# Patient Record
Sex: Female | Born: 1935 | Race: Black or African American | Hispanic: No | Smoking: Former smoker
Health system: Southern US, Community
[De-identification: ages and names within clinical notes are randomized; demographics above are authoritative.]

## PROBLEM LIST (undated history)

## (undated) DIAGNOSIS — M545 Low back pain: Secondary | ICD-10-CM

## (undated) DIAGNOSIS — I639 Cerebral infarction, unspecified: Secondary | ICD-10-CM

## (undated) DIAGNOSIS — I10 Essential (primary) hypertension: Secondary | ICD-10-CM

## (undated) DIAGNOSIS — M549 Dorsalgia, unspecified: Secondary | ICD-10-CM

## (undated) DIAGNOSIS — I69393 Ataxia following cerebral infarction: Secondary | ICD-10-CM

## (undated) DIAGNOSIS — G8929 Other chronic pain: Secondary | ICD-10-CM

## (undated) DIAGNOSIS — F419 Anxiety disorder, unspecified: Secondary | ICD-10-CM

## (undated) DIAGNOSIS — M25551 Pain in right hip: Secondary | ICD-10-CM

---

## 1997-07-29 HISTORY — PX: CATARACT EXTRACTION, BILATERAL: SHX1313

## 2005-10-08 ENCOUNTER — Ambulatory Visit (HOSPITAL_COMMUNITY): Admission: RE | Admit: 2005-10-08 | Discharge: 2005-10-09 | Payer: Self-pay | Admitting: Ophthalmology

## 2009-07-29 HISTORY — PX: BACK SURGERY: SHX140

## 2011-04-12 ENCOUNTER — Encounter (INDEPENDENT_AMBULATORY_CARE_PROVIDER_SITE_OTHER): Payer: Medicare Other | Admitting: Ophthalmology

## 2011-04-12 DIAGNOSIS — H35349 Macular cyst, hole, or pseudohole, unspecified eye: Secondary | ICD-10-CM

## 2011-04-12 DIAGNOSIS — H43819 Vitreous degeneration, unspecified eye: Secondary | ICD-10-CM

## 2013-01-26 DEATH — deceased

## 2017-03-03 ENCOUNTER — Inpatient Hospital Stay (HOSPITAL_COMMUNITY): Payer: Medicare Other

## 2017-03-03 ENCOUNTER — Inpatient Hospital Stay (HOSPITAL_COMMUNITY)
Admission: AD | Admit: 2017-03-03 | Discharge: 2017-03-10 | DRG: 064 | Disposition: A | Discharge status: Rehabilitation Facility | Payer: Medicare Other | Source: Other Acute Inpatient Hospital | Attending: Neurology | Admitting: Neurology

## 2017-03-03 ENCOUNTER — Encounter (HOSPITAL_COMMUNITY): Payer: Self-pay | Admitting: *Deleted

## 2017-03-03 DIAGNOSIS — Z87891 Personal history of nicotine dependence: Secondary | ICD-10-CM | POA: Diagnosis not present

## 2017-03-03 DIAGNOSIS — I10 Essential (primary) hypertension: Secondary | ICD-10-CM | POA: Diagnosis present

## 2017-03-03 DIAGNOSIS — F419 Anxiety disorder, unspecified: Secondary | ICD-10-CM | POA: Diagnosis present

## 2017-03-03 DIAGNOSIS — G4700 Insomnia, unspecified: Secondary | ICD-10-CM | POA: Diagnosis present

## 2017-03-03 DIAGNOSIS — Z823 Family history of stroke: Secondary | ICD-10-CM | POA: Diagnosis not present

## 2017-03-03 DIAGNOSIS — Z9841 Cataract extraction status, right eye: Secondary | ICD-10-CM | POA: Diagnosis not present

## 2017-03-03 DIAGNOSIS — K635 Polyp of colon: Secondary | ICD-10-CM | POA: Diagnosis not present

## 2017-03-03 DIAGNOSIS — R269 Unspecified abnormalities of gait and mobility: Secondary | ICD-10-CM

## 2017-03-03 DIAGNOSIS — I639 Cerebral infarction, unspecified: Secondary | ICD-10-CM | POA: Diagnosis present

## 2017-03-03 DIAGNOSIS — Z79899 Other long term (current) drug therapy: Secondary | ICD-10-CM | POA: Diagnosis not present

## 2017-03-03 DIAGNOSIS — M4802 Spinal stenosis, cervical region: Secondary | ICD-10-CM | POA: Diagnosis present

## 2017-03-03 DIAGNOSIS — Z882 Allergy status to sulfonamides status: Secondary | ICD-10-CM

## 2017-03-03 DIAGNOSIS — D649 Anemia, unspecified: Secondary | ICD-10-CM | POA: Diagnosis present

## 2017-03-03 DIAGNOSIS — K640 First degree hemorrhoids: Secondary | ICD-10-CM | POA: Diagnosis present

## 2017-03-03 DIAGNOSIS — Z7982 Long term (current) use of aspirin: Secondary | ICD-10-CM | POA: Diagnosis not present

## 2017-03-03 DIAGNOSIS — I63532 Cerebral infarction due to unspecified occlusion or stenosis of left posterior cerebral artery: Secondary | ICD-10-CM | POA: Diagnosis not present

## 2017-03-03 DIAGNOSIS — I69393 Ataxia following cerebral infarction: Secondary | ICD-10-CM | POA: Diagnosis not present

## 2017-03-03 DIAGNOSIS — E876 Hypokalemia: Secondary | ICD-10-CM | POA: Diagnosis present

## 2017-03-03 DIAGNOSIS — R42 Dizziness and giddiness: Secondary | ICD-10-CM | POA: Diagnosis not present

## 2017-03-03 DIAGNOSIS — Z881 Allergy status to other antibiotic agents status: Secondary | ICD-10-CM | POA: Diagnosis not present

## 2017-03-03 DIAGNOSIS — K5900 Constipation, unspecified: Secondary | ICD-10-CM | POA: Diagnosis present

## 2017-03-03 DIAGNOSIS — I610 Nontraumatic intracerebral hemorrhage in hemisphere, subcortical: Secondary | ICD-10-CM

## 2017-03-03 DIAGNOSIS — Z888 Allergy status to other drugs, medicaments and biological substances status: Secondary | ICD-10-CM | POA: Diagnosis not present

## 2017-03-03 DIAGNOSIS — I619 Nontraumatic intracerebral hemorrhage, unspecified: Secondary | ICD-10-CM | POA: Diagnosis present

## 2017-03-03 DIAGNOSIS — G8929 Other chronic pain: Secondary | ICD-10-CM | POA: Diagnosis present

## 2017-03-03 DIAGNOSIS — R195 Other fecal abnormalities: Secondary | ICD-10-CM

## 2017-03-03 DIAGNOSIS — K633 Ulcer of intestine: Secondary | ICD-10-CM | POA: Diagnosis present

## 2017-03-03 DIAGNOSIS — Z8673 Personal history of transient ischemic attack (TIA), and cerebral infarction without residual deficits: Secondary | ICD-10-CM

## 2017-03-03 DIAGNOSIS — G936 Cerebral edema: Secondary | ICD-10-CM | POA: Diagnosis present

## 2017-03-03 DIAGNOSIS — D123 Benign neoplasm of transverse colon: Secondary | ICD-10-CM | POA: Diagnosis present

## 2017-03-03 DIAGNOSIS — K2980 Duodenitis without bleeding: Secondary | ICD-10-CM | POA: Diagnosis present

## 2017-03-03 DIAGNOSIS — K920 Hematemesis: Secondary | ICD-10-CM | POA: Diagnosis not present

## 2017-03-03 DIAGNOSIS — Z79891 Long term (current) use of opiate analgesic: Secondary | ICD-10-CM | POA: Diagnosis not present

## 2017-03-03 DIAGNOSIS — R270 Ataxia, unspecified: Secondary | ICD-10-CM | POA: Diagnosis present

## 2017-03-03 DIAGNOSIS — F411 Generalized anxiety disorder: Secondary | ICD-10-CM | POA: Diagnosis not present

## 2017-03-03 DIAGNOSIS — K5730 Diverticulosis of large intestine without perforation or abscess without bleeding: Secondary | ICD-10-CM | POA: Diagnosis present

## 2017-03-03 DIAGNOSIS — M545 Low back pain, unspecified: Secondary | ICD-10-CM

## 2017-03-03 DIAGNOSIS — E042 Nontoxic multinodular goiter: Secondary | ICD-10-CM | POA: Diagnosis present

## 2017-03-03 DIAGNOSIS — E785 Hyperlipidemia, unspecified: Secondary | ICD-10-CM | POA: Diagnosis present

## 2017-03-03 DIAGNOSIS — Z91018 Allergy to other foods: Secondary | ICD-10-CM

## 2017-03-03 DIAGNOSIS — K921 Melena: Secondary | ICD-10-CM | POA: Diagnosis present

## 2017-03-03 DIAGNOSIS — Z9842 Cataract extraction status, left eye: Secondary | ICD-10-CM

## 2017-03-03 DIAGNOSIS — I63233 Cerebral infarction due to unspecified occlusion or stenosis of bilateral carotid arteries: Secondary | ICD-10-CM | POA: Diagnosis not present

## 2017-03-03 DIAGNOSIS — R2689 Other abnormalities of gait and mobility: Secondary | ICD-10-CM | POA: Diagnosis present

## 2017-03-03 DIAGNOSIS — I69993 Ataxia following unspecified cerebrovascular disease: Secondary | ICD-10-CM | POA: Diagnosis not present

## 2017-03-03 DIAGNOSIS — I614 Nontraumatic intracerebral hemorrhage in cerebellum: Secondary | ICD-10-CM | POA: Diagnosis not present

## 2017-03-03 HISTORY — DX: Pain in right hip: M25.551

## 2017-03-03 HISTORY — DX: Dorsalgia, unspecified: M54.9

## 2017-03-03 HISTORY — DX: Cerebral infarction, unspecified: I63.9

## 2017-03-03 HISTORY — DX: Anxiety disorder, unspecified: F41.9

## 2017-03-03 HISTORY — DX: Low back pain: M54.5

## 2017-03-03 HISTORY — DX: Ataxia following cerebral infarction: I69.393

## 2017-03-03 HISTORY — DX: Essential (primary) hypertension: I10

## 2017-03-03 HISTORY — DX: Other chronic pain: G89.29

## 2017-03-03 LAB — MRSA PCR SCREENING: MRSA BY PCR: NEGATIVE

## 2017-03-03 MED ORDER — NICARDIPINE HCL IN NACL 20-0.86 MG/200ML-% IV SOLN: 0.0000 mg/h | INTRAVENOUS | Status: DC

## 2017-03-03 MED ORDER — NICARDIPINE HCL IN NACL 20-0.86 MG/200ML-% IV SOLN
3.0000 mg/h | INTRAVENOUS | Status: DC
  Administered 2017-03-03: 3 mg/h via INTRAVENOUS

## 2017-03-03 MED ORDER — STROKE: EARLY STAGES OF RECOVERY BOOK
Freq: Once | Status: DC
  Filled 2017-03-03: qty 1

## 2017-03-03 MED ORDER — LABETALOL HCL 5 MG/ML IV SOLN: 20.0000 mg | Freq: Once | INTRAVENOUS | Status: DC

## 2017-03-03 MED ORDER — SODIUM CHLORIDE 0.9 % IV SOLN
INTRAVENOUS | Status: DC
  Administered 2017-03-03 – 2017-03-05 (×6): via INTRAVENOUS

## 2017-03-03 MED ORDER — ACETAMINOPHEN 325 MG PO TABS: 650.0000 mg | ORAL_TABLET | ORAL | Status: DC | PRN

## 2017-03-03 MED ORDER — IOPAMIDOL (ISOVUE-370) INJECTION 76%
INTRAVENOUS | Status: AC
  Administered 2017-03-03: 50 mL
  Filled 2017-03-03: qty 50

## 2017-03-03 MED ORDER — SODIUM CHLORIDE 0.9 % IV SOLN: INTRAVENOUS | Status: DC

## 2017-03-03 MED ORDER — ACETAMINOPHEN 160 MG/5ML PO SOLN: 650.0000 mg | ORAL | Status: DC | PRN

## 2017-03-03 MED ORDER — ACETAMINOPHEN 325 MG PO TABS
650.0000 mg | ORAL_TABLET | Freq: Four times a day (QID) | ORAL | Status: DC | PRN
  Administered 2017-03-03 – 2017-03-08 (×5): 650 mg via ORAL
  Filled 2017-03-03 (×6): qty 2

## 2017-03-03 MED ORDER — LABETALOL HCL 5 MG/ML IV SOLN: 10.0000 mg | INTRAVENOUS | Status: DC | PRN

## 2017-03-03 MED ORDER — PANTOPRAZOLE SODIUM 40 MG IV SOLR: 40.0000 mg | Freq: Every day | INTRAVENOUS | Status: DC

## 2017-03-03 MED ORDER — HYDRALAZINE HCL 20 MG/ML IJ SOLN
10.0000 mg | INTRAMUSCULAR | Status: DC | PRN
  Administered 2017-03-03: 20 mg via INTRAVENOUS
  Filled 2017-03-03: qty 1

## 2017-03-03 MED ORDER — SENNOSIDES-DOCUSATE SODIUM 8.6-50 MG PO TABS: 1.0000 | ORAL_TABLET | Freq: Two times a day (BID) | ORAL | Status: DC

## 2017-03-03 MED ORDER — NICARDIPINE HCL IN NACL 20-0.86 MG/200ML-% IV SOLN
5.0000 mg/h | INTRAVENOUS | Status: DC
  Filled 2017-03-03: qty 200

## 2017-03-03 MED ORDER — STROKE: EARLY STAGES OF RECOVERY BOOK
Freq: Once | Status: AC
  Administered 2017-03-03: 20:00:00
  Filled 2017-03-03: qty 1

## 2017-03-03 MED ORDER — CLEVIDIPINE BUTYRATE 0.5 MG/ML IV EMUL
0.0000 mg/h | INTRAVENOUS | Status: DC
  Administered 2017-03-04 (×3): 21 mg/h via INTRAVENOUS
  Administered 2017-03-04: 18 mg/h via INTRAVENOUS
  Administered 2017-03-04: 16 mg/h via INTRAVENOUS
  Administered 2017-03-04: 21 mg/h via INTRAVENOUS
  Administered 2017-03-04: 10 mg/h via INTRAVENOUS
  Administered 2017-03-05: 9 mg/h via INTRAVENOUS
  Administered 2017-03-05: 3 mg/h via INTRAVENOUS
  Administered 2017-03-05: 2 mg/h via INTRAVENOUS
  Filled 2017-03-03 (×11): qty 50

## 2017-03-03 MED ORDER — SENNOSIDES-DOCUSATE SODIUM 8.6-50 MG PO TABS
1.0000 | ORAL_TABLET | Freq: Every evening | ORAL | Status: DC | PRN
  Administered 2017-03-05: 1 via ORAL
  Filled 2017-03-03: qty 1

## 2017-03-03 MED ORDER — ACETAMINOPHEN 650 MG RE SUPP: 650.0000 mg | RECTAL | Status: DC | PRN

## 2017-03-03 MED ORDER — CLEVIDIPINE BUTYRATE 0.5 MG/ML IV EMUL
0.0000 mg/h | INTRAVENOUS | Status: DC
  Administered 2017-03-03 (×3): 21 mg/h via INTRAVENOUS
  Administered 2017-03-03: 2 mg/h via INTRAVENOUS
  Administered 2017-03-03: 4 mg/h via INTRAVENOUS
  Filled 2017-03-03 (×5): qty 50

## 2017-03-03 MED ORDER — LABETALOL HCL 5 MG/ML IV SOLN
10.0000 mg | INTRAVENOUS | Status: AC | PRN
  Administered 2017-03-04 (×2): 10 mg via INTRAVENOUS
  Filled 2017-03-03 (×2): qty 4

## 2017-03-03 NOTE — H&P (Addendum)
History and physical      History obtained from:  Chart and family members  HPI:                                                                                                                                         Ashley Chan is an 83 y.o. female with history of hypertension who presented to [facility] emergency room complaining of not feeling well. Apparently patient woke up in the morning not feeling well and then started noticing dizziness followed by nausea vomiting and some coffee-ground emesis. Initial head CT was negative. Unfortunately they were unable to get MRI secondary to no MRIs being done at [facility] over the weekend. 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 For this reason patient was transferred to [facility] for further evaluation and care.  Date last known well: Date: 03/02/2017 Time last known well: Unable to determine tPA Given: No: Hemorrhagic transformation, no last known normal   Modified Rankin: Rankin Score=0    Past Medical History:  Diagnosis Date  . Hypertension     Past Surgical History:  Procedure Laterality Date  . BACK SURGERY  2011  . CATARACT EXTRACTION, BILATERAL  1999    No family history on file. Social History:  reports that she quit smoking about 55 years ago. Her smoking use included Cigarettes. She started smoking about 69 years ago. She has never used smokeless tobacco. She reports that she does not drink alcohol or use drugs.  Allergies:  Allergies  Allergen Reactions  . Benzyl Alcohol   . Ethanol   . Lidocaine   . Propranolol   . Sulfa Antibiotics     Medications:                                                                                                                           Prior to Admission:  Labetalol 300 mg tabs twice a day Hydrochlorothiazide 12.5  mg by mouth daily Clonazepam 0.5 mg daily when necessary Tramadol 50 mg by mouth twice a day when necessary Diclofenac 75 mg twice a day    Scheduled: .  stroke: mapping our early stages of recovery book   Does not apply Once    ROS:  History obtained from the patient  General ROS: negative for - chills, fatigue, fever, night sweats, weight gain or weight loss Psychological ROS: negative for - behavioral disorder, hallucinations, memory difficulties, mood swings or suicidal ideation Ophthalmic ROS: negative for - blurry vision, double vision, eye pain or loss of vision ENT ROS: negative for - epistaxis, nasal discharge, oral lesions, sore throat, tinnitus or vertigo Allergy and Immunology ROS: negative for - hives or itchy/watery eyes Hematological and Lymphatic ROS: negative for - bleeding problems, bruising or swollen lymph nodes Endocrine ROS: negative for - galactorrhea, hair pattern changes, polydipsia/polyuria or temperature intolerance Respiratory ROS: negative for - cough, hemoptysis, shortness of breath or wheezing Cardiovascular ROS: negative for - chest pain, dyspnea on exertion, edema or irregular heartbeat Gastrointestinal ROS: negative for - abdominal pain, diarrhea, hematemesis, nausea/vomiting or stool incontinence Genito-Urinary ROS: negative for - dysuria, hematuria, incontinence or urinary frequency/urgency Musculoskeletal ROS: negative for - joint swelling or muscular weakness Neurological ROS: as noted in HPI Dermatological ROS: negative for rash and skin lesion changes  Neurologic Examination:                                                                                                      Blood pressure (!) 174/89, pulse (!) 105, resp. rate (!) 22, height 5\' 7"  (1.702 m), SpO2 100 %.  HEENT-  Normocephalic, no lesions, without  obvious abnormality.  Normal external eye and conjunctiva.  Normal TM's bilaterally.  Normal auditory canals and external ears. Normal external nose, mucus membranes and septum.  Normal pharynx. Cardiovascular- S1, S2 normal, pulses palpable throughout   Lungs- chest clear, no wheezing, rales, normal symmetric air entry, Heart exam - S1, S2 normal, no murmur, no gallop, rate regular Abdomen- normal findings: bowel sounds normal Extremities- no edema Lymph-no adenopathy palpable Musculoskeletal-no joint tenderness, deformity or swelling Skin-warm and dry, no hyperpigmentation, vitiligo, or suspicious lesions  Neurological Examination Mental Status: Alert, oriented, thought content appropriate.  Speech fluent without evidence of aphasia.  Able to follow 3 step commands without difficulty. Cranial Nerves: II:  Visual fields grossly normal,  III,IV, VI: ptosis not present, extra-ocular motions intact bilaterally but sacaddic, pupils equal, round, reactive to light and accommodation V,VII: smile symmetric, facial light touch sensation normal bilaterally VIII: hearing normal bilaterally IX,X: uvula rises symmetrically XI: bilateral shoulder shrug XII: midline tongue extension Motor: Right : Upper extremity   5/5    Left:     Upper extremity   5/5  Lower extremity   5/5     Lower extremity   5/5 Tone and bulk:normal tone throughout; no atrophy noted Sensory: Pinprick and light touch intact throughout, bilaterally Deep Tendon Reflexes: 2+ and symmetric throughout Plantars: Right: downgoing   Left: downgoing Cerebellar: Dysmetric left finger-to-nose, dysmetric left heel-to-shin test Gait: not tested for saftey       Lab Results: Basic Metabolic Panel: No results for input(s): NA, K, CL, CO2, GLUCOSE, BUN, CREATININE, CALCIUM, MG, PHOS in the last 168 hours.  Liver Function Tests: No results for input(s): AST, ALT, ALKPHOS, BILITOT, PROT, ALBUMIN in  the last 168 hours. No results for  input(s): LIPASE, AMYLASE in the last 168 hours. No results for input(s): AMMONIA in the last 168 hours.  CBC: No results for input(s): WBC, NEUTROABS, HGB, HCT, MCV, PLT in the last 168 hours.  Cardiac Enzymes: No results for input(s): CKTOTAL, CKMB, CKMBINDEX, TROPONINI in the last 168 hours.  Lipid Panel: No results for input(s): CHOL, TRIG, HDL, CHOLHDL, VLDL, LDLCALC in the last 168 hours.  CBG: No results for input(s): GLUCAP in the last 168 hours.  Microbiology: No results found for this or any previous visit.  Coagulation Studies: No results for input(s): LABPROT, INR in the last 72 hours.  Imaging: No results found.     Assessment and plan discussed with with attending physician and they are in agreement.    Etta Quill PA-C [facility] [phone]  03/03/2017, 3:19 PM   Assessment: 83 y.o. 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 The location could be embolic or thrombotic. She is being admitted to the intensive care unit due to effacement of the fourth ventricle for hydrocephalus watch.  Stroke Risk Factors - hypertension  Plan: 1. HgbA1c, fasting lipid panel 2. Repeat CT scan in the morning 3. Frequent neuro checks 4. Echocardiogram 5. CTA head and neck 6. Prophylactic held due to hemorrhagic transformation 7. Risk factor modification 8. Telemetry monitoring 9. PT consult, OT consult, Speech consult 10. Hypertension control due to hemorrhagic transformation, goal BP less than 833 systolic. Ordered Cardene 11. please page stroke NP  Or  PA  Or MD  from 8am -4 pm as this patient will be followed by the stroke team at this point.   You can look them up on [email or web address]    This patient is critically ill and at significant risk of neurological worsening, death and care requires constant monitoring of vital signs, hemodynamics,respiratory and cardiac monitoring, neurological assessment, discussion with family,  other specialists and medical decision making of high complexity. I spent 45 minutes of neurocritical care time  in the care of  this patient.  Roland Rack, MD [facility] [phone]  If 7pm- 7am, please page neurology on call as listed in AMION. 03/03/2017  4:59 PM

## 2017-03-03 NOTE — Progress Notes (Signed)
Dr. Leonel Ramsay modified BP parameters to less than 140. Cardene gtt started.

## 2017-03-04 ENCOUNTER — Inpatient Hospital Stay (HOSPITAL_COMMUNITY): Payer: Medicare Other

## 2017-03-04 ENCOUNTER — Encounter (HOSPITAL_COMMUNITY): Payer: Self-pay | Admitting: Neurology

## 2017-03-04 DIAGNOSIS — I639 Cerebral infarction, unspecified: Secondary | ICD-10-CM

## 2017-03-04 LAB — VAS US CAROTID
LCCADDIAS: -15 cm/s
LCCAPSYS: 170 cm/s
LEFT ECA DIAS: -25 cm/s
LEFT VERTEBRAL DIAS: 8 cm/s
LICADSYS: -131 cm/s
LICAPSYS: 292 cm/s
Left CCA dist sys: -114 cm/s
Left CCA prox dias: 17 cm/s
Left ICA dist dias: -23 cm/s
Left ICA prox dias: 23 cm/s
RIGHT ECA DIAS: 29 cm/s
RIGHT VERTEBRAL DIAS: -14 cm/s
Right CCA prox dias: -15 cm/s
Right CCA prox sys: -125 cm/s
Right cca dist sys: -98 cm/s

## 2017-03-04 LAB — CBC
HEMATOCRIT: 37 % (ref 36.0–46.0)
Hemoglobin: 13.1 g/dL (ref 12.0–15.0)
MCH: 33.6 pg (ref 26.0–34.0)
MCHC: 35.4 g/dL (ref 30.0–36.0)
MCV: 94.9 fL (ref 78.0–100.0)
Platelets: 259 10*3/uL (ref 150–400)
RBC: 3.9 MIL/uL (ref 3.87–5.11)
RDW: 14.3 % (ref 11.5–15.5)
WBC: 9 10*3/uL (ref 4.0–10.5)

## 2017-03-04 LAB — COMPREHENSIVE METABOLIC PANEL
ALBUMIN: 3.4 g/dL — AB (ref 3.5–5.0)
ALT: 63 U/L — ABNORMAL HIGH (ref 14–54)
AST: 99 U/L — AB (ref 15–41)
Alkaline Phosphatase: 45 U/L (ref 38–126)
Anion gap: 13 (ref 5–15)
BUN: 14 mg/dL (ref 6–20)
CHLORIDE: 104 mmol/L (ref 101–111)
CO2: 19 mmol/L — ABNORMAL LOW (ref 22–32)
Calcium: 8.3 mg/dL — ABNORMAL LOW (ref 8.9–10.3)
Creatinine, Ser: 0.85 mg/dL (ref 0.44–1.00)
GFR calc Af Amer: >60 mL/min (ref >60)
GFR calc non Af Amer: >60 mL/min (ref >60)
Glucose, Bld: 117 mg/dL — ABNORMAL HIGH (ref 65–99)
POTASSIUM: 4.5 mmol/L (ref 3.5–5.1)
Sodium: 136 mmol/L (ref 135–145)
Total Bilirubin: 1.9 mg/dL — ABNORMAL HIGH (ref 0.3–1.2)

## 2017-03-04 LAB — LIPID PANEL
CHOLESTEROL: 216 mg/dL — AB (ref 0–200)
HDL: 26 mg/dL — ABNORMAL LOW (ref >40)
LDL Cholesterol: UNDETERMINED mg/dL (ref 0–99)
TRIGLYCERIDES: 969 mg/dL — AB (ref <150)
Total CHOL/HDL Ratio: 8.3 RATIO
VLDL: UNDETERMINED mg/dL (ref 0–40)

## 2017-03-04 MED ORDER — SODIUM CHLORIDE 0.9 % IV BOLUS (SEPSIS)
500.0000 mL | Freq: Once | INTRAVENOUS | Status: AC
  Administered 2017-03-04: 500 mL via INTRAVENOUS

## 2017-03-04 MED ORDER — ONDANSETRON HCL 4 MG/2ML IJ SOLN
4.0000 mg | INTRAMUSCULAR | Status: DC | PRN
  Administered 2017-03-05 – 2017-03-06 (×3): 4 mg via INTRAVENOUS
  Filled 2017-03-04 (×4): qty 2

## 2017-03-04 MED ORDER — ONDANSETRON HCL 4 MG/2ML IJ SOLN
INTRAMUSCULAR | Status: AC
  Filled 2017-03-04: qty 2

## 2017-03-04 NOTE — Progress Notes (Signed)
Patient's urine output through the night was minimal.MD paged to notify. Awaiting a call back

## 2017-03-04 NOTE — Progress Notes (Signed)
STROKE TEAM PROGRESS NOTE   HISTORY OF PRESENT ILLNESS (per record) Ashley Chan is an 83 y.o. female with history of hypertension who presented to [facility] emergency room complaining of not feeling well. Apparently patient woke up in the morning not feeling well and then started noticing dizziness followed by nausea vomiting and some coffee-ground emesis. Initial head CT was negative. Unfortunately they were unable to get MRI secondary to no MRIs being done at [facility] over the weekend. 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 For this reason patient was transferred to [facility] for further evaluation and care.  Date last known well: Date: 03/02/2017 Time last known well: Unable to determine   Modified Rankin: Rankin Score=0  Patient was not administered IV t-PA secondary to hemorrhagic transformation/no time last known well. She was admitted to the neuro ICU for further evaluation and treatment.   SUBJECTIVE (INTERVAL HISTORY) Her grandaughter is at the bedside.  The patient is awake, alert, and follows commands appropriately.  No need for hypertonic saline at this time.  The patient reports waking up with dizziness, nausea, and vomiting.  Initial imaging at [facility] in the [facility] On day of admission noncontrast CT scan shows no abnormality and follow-up MRI scan done 2 days later shows ischemic infarct with hemorrhagic transformation.  Repeat imaging at [facility] with hemorrhagic transformation of infarct.  Pt is endorsing neck pain now.  LUE ataxia and saccadic dysmetria on exam.  OBJECTIVE Temp:  [98.5 F (36.9 C)-99.3 F (37.4 C)] 98.7 F (37.1 C) (08/07 1200) Pulse Rate:  [77-118] 91 (08/07 1315) Cardiac Rhythm: Normal sinus rhythm (08/07 0800) Resp:  [12-28] 18 (08/07 1315) BP: (113-187)/(45-139) 155/54 (08/07  1315) SpO2:  [95 %-100 %] 97 % (08/07 0800)  CBC:   Recent Labs Lab 03/04/17 0329  WBC 9.0  HGB 13.1  HCT 37.0  MCV 94.9  PLT 811    Basic Metabolic Panel:   Recent Labs Lab 03/04/17 0329  NA 136  K 4.5  CL 104  CO2 19*  GLUCOSE 117*  BUN 14  CREATININE 0.85  CALCIUM 8.3*    Lipid Panel:     Component Value Date/Time   CHOL 216 (H) 03/04/2017 0329   TRIG 969 (H) 03/04/2017 0329   HDL 26 (L) 03/04/2017 0329   CHOLHDL 8.3 03/04/2017 0329   VLDL UNABLE TO CALCULATE IF TRIGLYCERIDE OVER 400 mg/dL 03/04/2017 0329   LDLCALC UNABLE TO CALCULATE IF TRIGLYCERIDE OVER 400 mg/dL 03/04/2017 0329   HgbA1c: No results found for: HGBA1C Urine Drug Screen: No results found for: LABOPIA, COCAINSCRNUR, LABBENZ, AMPHETMU, THCU, LABBARB  Alcohol Level No results found for: ETH  IMAGING  Ct Angio Head W Or Wo Contrast Ct Angio Neck W Or Wo Contrast 03/03/2017  IMPRESSION: CT head: 1. Left superior cerebellar hemorrhage, associated vasogenic edema, and mass effect are stable in comparison with prior MRI given differences in technique. 2. Stable mild superior transtentorial herniation, partial effacement of quadrigeminal plate cistern, and of fourth ventricle. No hydrocephalus. 3. No new acute intracranial abnormality. CTA neck: 1. Patent carotid and vertebral arteries of the neck. 2. Moderate right carotid bifurcation calcified plaque with mild 30% proximal ICA stenosis. 3. Severe left carotid bifurcation calcified plaque with severe 70% proximal ICA stenosis. 4. Cervical degenerative changes with calcified posterior longitudinal ligament and ligamentum flavum from C4-5 the C5-6 with  severe canal stenosis. 5. Thyroid nodules measuring up to 15 mm. Further evaluation with thyroid ultrasound recommended. CTA head: 1. Patent circle of Willis. No large vessel occlusion, aneurysm, or vascular malformation identified. 2. Calcific plaque of the carotid siphons with mild right and moderate to severe  left paraclinoid stenosis. 3. No appreciable left AICA, possibly diminutive or occluded. These results will be called to the ordering clinician or representative by the Radiologist Assistant, and communication documented in the PACS or zVision Dashboard.  Ct Head Wo Contrast 03/04/2017 IMPRESSION: 1. Slight interval increase in size of left superior cerebellar hematoma as above with slightly worsened associated vasogenic edema. Associated mass effect on the adjacent fourth ventricle slightly worsened, although remains patent at this time. No hydrocephalus. 2. No other new acute intracranial process.   Dg Chest Port 1 View 03/03/2017 IMPRESSION: Improved aeration of the left lower lung. Atelectasis remains at both lung bases.  TTE 03/04/2017 **pending  Carotid US 03/04/2017  pending   PHYSICAL EXAM Pleasant elderly African-American lady currently not in distress. . Afebrile. Head is nontraumatic. Neck is supple without bruit.    Cardiac exam no murmur or gallop. Lungs are clear to auscultation. Distal pulses are well felt. Neurological Exam : Awake alert oriented 3. Diminished attention and recall. Follows commands well. Extraocular movements are full range but there is saccadic dysmetria on left gaze. Vision acuity and fields seem adequate. Face is symmetric without weakness. Tongue is midline. Mild weakness of left grip and intrinsic hand muscles. Fine finger movements are diminished on the left compared to the right. No upper or lower extremity drift noted. Mild left finger-to-nose and knee to heel dysmetria. Deep tendon flow is symmetric. Plantars downgoing. Gait not tested. ASSESSMENT/PLAN [name] is a 83 y.o. female with history of hypertension presenting with dizziness, nausea, and coffee ground emesis.  She did not receive IV t-PA due to [place].   Stroke: Left superior cerebellar hematoma with increased vasogenic edema and mass effect on adjacent fourth ventricle without  hydrocephalus.  Resultant  mild left hemi-ataxia  CT head: Slightly increased left superior cerebellar hematoma with slightly increased vasogenic edema.  Mass effect on adjacent fourth ventricle without hydrocephalus.    MRI head:  Done at [facility] reviewed  MRA head: not ordered  Carotid Doppler:  pending  2D Echo  pending   LDL > 400  HgbA1c pending   SCDs for VTE prophylaxis Diet Heart Room service appropriate? Yes; Fluid consistency: Thin  No antithrombotic prior to admission, now on No antithrombotic due to hemorrhage  Patient counseled to be compliant with her antithrombotic medications  Ongoing aggressive stroke risk factor management  Therapy recommendations:   pending  Disposition:   pending  Hypertension  Unstable  On clevidipine gtt  Goal SBP < 180 mmHg  Long-term BP goal normotensive  Hyperlipidemia  Home meds: none  LDL > 400, goal < 70  Statin held due to [place]  Start statin prior to discharge  Diabetes  HgbA1c  pending, goal < 7.0  Controlled  Other Stroke Risk Factors  Advanced age  Other Active Problems  None  Hospital day # 1  I have personally examined this patient, reviewed notes, independently viewed imaging studies, participated in medical decision making and plan of care.ROS completed by me personally and pertinent positives fully documented  I have made any additions or clarifications directly to the above note.  She presented to [facility] with nausea, vomiting and dizziness and initial CT head  was negative but MRI showed hemorrhagic infarct with cytotoxic edema. He remains at risk for neurological worsening, developing hydrocephalus and respiratory failure and needs close neurological monitoring and aggressive blood pressure control. Repeat CT scan of the head without contrast tomorrow. Long discussion of the bedside with the patient and family and answered questions. This patient is critically ill and at  significant risk of neurological worsening, death and care requires constant monitoring of vital signs, hemodynamics,respiratory and cardiac monitoring, extensive review of multiple databases, frequent neurological assessment, discussion with family, other specialists and medical decision making of high complexity.I have made any additions or clarifications directly to the above note.This critical care time does not reflect procedure time, or teaching time or supervisory time of PA/NP/Med Resident etc but could involve care discussion time.  I spent 35 minutes of neurocritical care time  in the care of  this patient.      Antony Contras, MD Medical Director [facility] Pager: [phone] 03/04/2017 2:28 PM   To contact Stroke Continuity provider, please refer to [email or web address] After hours, contact General Neurology

## 2017-03-04 NOTE — Progress Notes (Signed)
Cross cover note  1. Called in for SBP above goal on max claviprex. Ordered PRN labetalol.  2. Called in for poor UOP (150 cc in 12h). Ordered 500 cc NS bolus.  Stroke team will f/u in AM.  -- Amie [place], MD [facility] [phone]  If 7pm to 7am, please call on call as listed on AMION.

## 2017-03-04 NOTE — Progress Notes (Signed)
MD called back. Will address appropriately.

## 2017-03-04 NOTE — Evaluation (Signed)
Physical Therapy Evaluation Patient Details Name: Ashley Chan MRN: [id number] DOB: 1928-09-12 Today's Date: 03/04/2017   History of Present Illness  Patient is a 83 y/o female who presents with dizziness, N/V and some coffee-ground emesis. 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 PMH includes HTN.   Clinical Impression  Patient presents with left sided ataxia, impaired coordination, impaired balance, nausea and decreased mobility s/p above. Tolerated taking a few steps to get to chair with Mod A for balance secondary to LLE ataxia. Cues for gaze stabilization during mobility and importance of self feeding to improve function. Pt Mod I from home with daughters. BP on the higher side in 678L systolic. Will benefit from CIR to maximize independence and mobility prior to return home. Will follow acutely.    Follow Up Recommendations CIR;Supervision for mobility/OOB    Equipment Recommendations  Other (comment) (defer to next venue)    Recommendations for Other Services OT consult     Precautions / Restrictions Precautions Precautions: Fall Precaution Comments: SBP <140 Restrictions Weight Bearing Restrictions: No      Mobility  Bed Mobility Overal bed mobility: Needs Assistance Bed Mobility: Supine to Sit     Supine to sit: Mod assist;HOB elevated     General bed mobility comments: Assist to elevate trunk to EOB. Truncal ataxia and right lateral lean.   Transfers Overall transfer level: Needs assistance Equipment used: Rolling walker (2 wheeled) Transfers: Sit to/from [facility] Sit to Stand: Mod assist;+2 safety/equipment Stand pivot transfers: Mod assist;+2 safety/equipment       General transfer comment: Assist to power to standing. Able to take a few steps to chair with Mod A for balance, LLE ataxia with advancement. + dizziness and  nausea.  Ambulation/Gait             General Gait Details: Deferred due to safety/nausea.  Stairs            Wheelchair Mobility    Modified Rankin (Stroke Patients Only) Modified Rankin (Stroke Patients Only) Pre-Morbid Rankin Score: No symptoms Modified Rankin: Moderately severe disability     Balance Overall balance assessment: Needs assistance Sitting-balance support: Feet supported;No upper extremity supported Sitting balance-Leahy Scale: Fair Sitting balance - Comments: Able to sit EOB with right lateral trunk lean and truncal ataxia. Postural control: Right lateral lean Standing balance support: During functional activity;Bilateral upper extremity supported Standing balance-Leahy Scale: Poor Standing balance comment: Reliant on UEs and external support for balance due to ataxia.                             Pertinent Vitals/Pain Pain Assessment: Faces Faces Pain Scale: Hurts little more Pain Location: headache Pain Descriptors / Indicators: Headache Pain Intervention(s): Monitored during session;Repositioned    Home Living Family/patient expects to be discharged to:: Private residence Living Arrangements: Children (with 2 daughters) Available Help at Discharge: Family;Available PRN/intermittently Type of Home: House Home Access: Ramped entrance     Home Layout: One level Home Equipment: Clinical cytogeneticist - 2 wheels;Cane - single point      Prior Function Level of Independence: Independent with assistive device(s)         Comments: Pt uses SPC as needed. Drives.      Hand Dominance   Dominant Hand: Left    Extremity/Trunk Assessment   Upper Extremity Assessment Upper Extremity Assessment: Defer to OT evaluation;LUE deficits/detail LUE  Deficits / Details: Dysmetria present during functional tasks. LUE Sensation: decreased proprioception LUE Coordination: decreased gross motor;decreased fine motor    Lower Extremity  Assessment Lower Extremity Assessment: LLE deficits/detail LLE Deficits / Details: Ataxia present LLE. LLE Sensation: decreased proprioception LLE Coordination: decreased fine motor;decreased gross motor       Communication   Communication: No difficulties  Cognition Arousal/Alertness: Awake/alert Behavior During Therapy: WFL for tasks assessed/performed Overall Cognitive Status: Within Functional Limits for tasks assessed                                        General Comments General comments (skin integrity, edema, etc.): BP elevated in 150s. Daughter present during session.    Exercises     Assessment/Plan    PT Assessment Patient needs continued PT services  PT Problem List Decreased strength;Decreased mobility;Decreased balance;Cardiopulmonary status limiting activity;Decreased coordination       PT Treatment Interventions Gait training;Therapeutic activities;Therapeutic exercise;Patient/family education;Balance training;Functional mobility training;Neuromuscular re-education    PT Goals (Current goals can be found in the Care Plan section)  Acute Rehab PT Goals Patient Stated Goal: to return to independence PT Goal Formulation: With patient Time For Goal Achievement: 03/18/17 Potential to Achieve Goals: Good    Frequency Min 4X/week   Barriers to discharge        Co-evaluation               AM-PAC PT "6 Clicks" Daily Activity  Outcome Measure Difficulty turning over in bed (including adjusting bedclothes, sheets and blankets)?: Total Difficulty moving from lying on back to sitting on the side of the bed? : Total Difficulty sitting down on and standing up from a chair with arms (e.g., wheelchair, bedside commode, etc,.)?: Total Help needed moving to and from a bed to chair (including a wheelchair)?: A Lot Help needed walking in hospital room?: A Lot Help needed climbing 3-5 steps with a railing? : Total 6 Click Score: 8    End of  Session Equipment Utilized During Treatment: Gait belt Activity Tolerance: Patient tolerated treatment well;Other (comment) (nausea) Patient left: in chair;with call bell/phone within reach;with chair alarm set;with family/visitor present Nurse Communication: Mobility status PT Visit Diagnosis: Difficulty in walking, not elsewhere classified (R26.2);Unsteadiness on feet (R26.81);Ataxic gait (R26.0);Dizziness and giddiness (R42)    Time: 9407-6808 PT Time Calculation (min) (ACUTE ONLY): 25 min   Charges:   PT Evaluation $PT Eval Moderate Complexity: 1 Mod PT Treatments $Therapeutic Activity: 8-22 mins   PT G Codes:        Wray Kearns, PT, DPT [phone]    Ashley Chan 03/04/2017, 2:16 PM

## 2017-03-04 NOTE — Care Management Note (Signed)
Case Management Note  Patient Details  Name: Ashley Chan MRN: [id number] Date of Birth: 1929-04-22  Subjective/Objective:   Patient is a 83 y/o female who presents 03/03/17 with dizziness, N/V and some coffee-ground emesis. 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  PTA, pt resided at home with her two daughters.                Action/Plan: PT recommending CIR prior to returning home.  Will request order for rehab consult.    Expected Discharge Date:  03/08/17               Expected Discharge Plan:  [place]  In-House Referral:     Discharge planning Services  CM Consult  Post Acute Care Choice:    Choice offered to:     DME Arranged:    DME Agency:     HH Arranged:    HH Agency:     Status of Service:  In process, will continue to follow  If discussed at Long Length of Stay Meetings, dates discussed:    Additional Comments:  Reinaldo Raddle, RN, BSN  Trauma/Neuro ICU Case Manager [phone]

## 2017-03-04 NOTE — Progress Notes (Signed)
Rehab Admissions Coordinator Note:  Patient was screened by Retta Diones for appropriateness for an Inpatient Acute Rehab Consult.  At this time, we are recommending Inpatient Rehab consult.  Retta Diones 03/04/2017, 2:53 PM  I can be reached at [phone].

## 2017-03-04 NOTE — Progress Notes (Signed)
*  PRELIMINARY RESULTS* Vascular Ultrasound Carotid Duplex (Doppler) has been completed.  Preliminary findings: Findings consistent with a 1-39 percent stenosis involving the right internal carotid artery.  Findings consistent with a 1-39% left internal carotid artery stenosis by diastolic velocity, peak systolic internal carotid artery velocities fall within the 60 - 79% stenosis  range.  Difficult evaluation due to heterogeneous and calcified plaque.  Bilateral external carotid arteries demonstrate elevated velocities.  Bilateral vertebral arteries are patent and antegrade.  [place] 03/04/2017, 11:30 AM

## 2017-03-05 ENCOUNTER — Inpatient Hospital Stay (HOSPITAL_COMMUNITY): Payer: Medicare Other

## 2017-03-05 ENCOUNTER — Encounter (HOSPITAL_COMMUNITY): Payer: Self-pay | Admitting: Physical Medicine and Rehabilitation

## 2017-03-05 DIAGNOSIS — I10 Essential (primary) hypertension: Secondary | ICD-10-CM

## 2017-03-05 DIAGNOSIS — I614 Nontraumatic intracerebral hemorrhage in cerebellum: Secondary | ICD-10-CM

## 2017-03-05 DIAGNOSIS — I639 Cerebral infarction, unspecified: Principal | ICD-10-CM

## 2017-03-05 DIAGNOSIS — M545 Low back pain, unspecified: Secondary | ICD-10-CM

## 2017-03-05 DIAGNOSIS — G8929 Other chronic pain: Secondary | ICD-10-CM

## 2017-03-05 DIAGNOSIS — F411 Generalized anxiety disorder: Secondary | ICD-10-CM

## 2017-03-05 DIAGNOSIS — I69393 Ataxia following cerebral infarction: Secondary | ICD-10-CM

## 2017-03-05 LAB — HEMOGLOBIN A1C
HEMOGLOBIN A1C: 5.4 % (ref 4.8–5.6)
MEAN PLASMA GLUCOSE: 108 mg/dL

## 2017-03-05 MED ORDER — LABETALOL HCL 5 MG/ML IV SOLN
20.0000 mg | INTRAVENOUS | Status: DC | PRN
  Administered 2017-03-05 – 2017-03-07 (×8): 20 mg via INTRAVENOUS
  Filled 2017-03-05 (×8): qty 4

## 2017-03-05 MED ORDER — HYDROCHLOROTHIAZIDE 25 MG PO TABS
12.5000 mg | ORAL_TABLET | Freq: Every day | ORAL | Status: DC
  Administered 2017-03-05 – 2017-03-06 (×2): 12.5 mg via ORAL
  Filled 2017-03-05 (×2): qty 1

## 2017-03-05 MED ORDER — AMLODIPINE BESYLATE 5 MG PO TABS
5.0000 mg | ORAL_TABLET | Freq: Every day | ORAL | Status: DC
  Administered 2017-03-05 – 2017-03-06 (×2): 5 mg via ORAL
  Filled 2017-03-05 (×2): qty 1

## 2017-03-05 NOTE — Progress Notes (Signed)
Physical Therapy Treatment Patient Details Name: Ashley Chan MRN: [id number] DOB: Sep 23, 1928 Today's Date: 03/05/2017    History of Present Illness Patient is a 83 y/o female who presents with dizziness, N/V and some coffee-ground emesis. 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 PMH includes HTN.     PT Comments    Patient seen for mobility progression. Patient tolerated increased activity with gait training and functional task performance today. Patient continues to require multi modal cues and increased physical assist (+2) for all aspects of mobility. At this time CIR remains appropriate.    Follow Up Recommendations  CIR;Supervision for mobility/OOB     Equipment Recommendations  Other (comment) (defer to next venue)    Recommendations for Other Services OT consult     Precautions / Restrictions Precautions Precautions: Fall Precaution Comments: SBP <140 Restrictions Weight Bearing Restrictions: No    Mobility  Bed Mobility Overal bed mobility: Needs Assistance Bed Mobility: Supine to Sit     Supine to sit: Mod assist;HOB elevated     General bed mobility comments: Assist to elevate trunk to EOB. Truncal ataxia and right lateral lean.   Transfers Overall transfer level: Needs assistance Equipment used: Rolling walker (2 wheeled) Transfers: Sit to/from [facility] Sit to Stand: Mod assist;+2 safety/equipment Stand pivot transfers: Mod assist;+2 safety/equipment       General transfer comment: Assist to power to standing. Able to take a few steps to chair with Mod A for balance, LLE ataxia with advancement. + dizziness and nausea.  Ambulation/Gait Ambulation/Gait assistance: Max assist Ambulation Distance (Feet): 18 Feet Assistive device: 2 person hand held assist Gait Pattern/deviations: Step-to pattern;Ataxic;Narrow base of  support;Scissoring Gait velocity: decreased Gait velocity interpretation: <1.8 ft/sec, indicative of risk for recurrent falls General Gait Details: patient with significant ataxia limiting ability to advance limbs safely, moderate to maximal assist for mobility. Manual facilitation of weight shift and stride with emphasis on placement of LE   Stairs            Wheelchair Mobility    Modified Rankin (Stroke Patients Only) Modified Rankin (Stroke Patients Only) Pre-Morbid Rankin Score: No symptoms Modified Rankin: Moderately severe disability     Balance Overall balance assessment: Needs assistance Sitting-balance support: Feet supported;No upper extremity supported Sitting balance-Leahy Scale: Fair Sitting balance - Comments: Able to sit EOB with continued truncal ataxia. Postural control: Right lateral lean Standing balance support: During functional activity;Bilateral upper extremity supported Standing balance-Leahy Scale: Poor Standing balance comment: Reliant on UEs and external support for balance due to ataxia.                            Cognition Arousal/Alertness: Awake/alert Behavior During Therapy: WFL for tasks assessed/performed Overall Cognitive Status: Impaired/Different from baseline Area of Impairment: Attention;Following commands;Awareness;Problem solving                   Current Attention Level: Sustained   Following Commands: Follows one step commands inconsistently;Follows one step commands with increased time   Awareness: Emergent Problem Solving: Slow processing;Difficulty sequencing;Requires verbal cues;Requires tactile cues General Comments: patient having difficulty sequencing basic tasks such as hygiene performance or following commands from therapy team. Some confusion with directional instruction and delays with processing       Exercises      General Comments        Pertinent Vitals/Pain Pain Assessment:  Faces Faces Pain Scale: Hurts even more Pain Location: headache Pain Descriptors / Indicators: Headache Pain Intervention(s): Monitored during session    Home Living                      Prior Function            PT Goals (current goals can now be found in the care plan section) Acute Rehab PT Goals Patient Stated Goal: to return to independence PT Goal Formulation: With patient Time For Goal Achievement: 03/18/17 Potential to Achieve Goals: Good Progress towards PT goals: Progressing toward goals    Frequency    Min 4X/week      PT Plan Current plan remains appropriate    Co-evaluation PT/OT/SLP Co-Evaluation/Treatment: Yes Reason for Co-Treatment: Complexity of the patient's impairments (multi-system involvement);Necessary to address cognition/behavior during functional activity;For patient/therapist safety PT goals addressed during session: Mobility/safety with mobility OT goals addressed during session: ADL's and self-care      AM-PAC PT "6 Clicks" Daily Activity  Outcome Measure  Difficulty turning over in bed (including adjusting bedclothes, sheets and blankets)?: Total Difficulty moving from lying on back to sitting on the side of the bed? : Total Difficulty sitting down on and standing up from a chair with arms (e.g., wheelchair, bedside commode, etc,.)?: Total Help needed moving to and from a bed to chair (including a wheelchair)?: A Lot Help needed walking in hospital room?: A Lot Help needed climbing 3-5 steps with a railing? : Total 6 Click Score: 8    End of Session Equipment Utilized During Treatment: Gait belt Activity Tolerance: Patient tolerated treatment well;Other (comment) (nausea) Patient left: in chair;with call bell/phone within reach;with chair alarm set;with family/visitor present Nurse Communication: Mobility status PT Visit Diagnosis: Difficulty in walking, not elsewhere classified (R26.2);Unsteadiness on feet (R26.81);Ataxic  gait (R26.0);Dizziness and giddiness (R42)     Time: 6759-1638 PT Time Calculation (min) (ACUTE ONLY): 23 min  Charges:  $Therapeutic Activity: 8-22 mins                    G Codes:       Alben Deeds, PT DPT  Board Certified Neurologic Specialist [place] 03/05/2017, 5:24 PM

## 2017-03-05 NOTE — Progress Notes (Signed)
Pt sent down for repeat CT head via bed and monitor and back . Pt tolerated procedure well.

## 2017-03-05 NOTE — Progress Notes (Signed)
SLP Cancellation Note  Patient Details Name: LESLEA VOWLES MRN: [id number] DOB: [birth year]   Cancelled treatment:       Reason Eval/Treat Not Completed: Patient at procedure or test/unavailable (having testing in room). Will f/u as able.   Germain Osgood 03/05/2017, 2:38 PM  Germain Osgood, M.A. CCC-SLP [phone]

## 2017-03-05 NOTE — Evaluation (Signed)
Occupational Therapy Evaluation Patient Details Name: Ashley Chan MRN: [id number] DOB: 11-20-1928 Today's Date: 03/05/2017    History of Present Illness Patient is a 83 y/o female who presents with dizziness, N/V and some coffee-ground emesis. 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 PMH includes HTN.    Clinical Impression   Pt reports she was independent with ADL PTA. Currently pt requires max assist +2 for functional mobility and mod-max assist for ADL. Pt presenting with LUE coordination deficits, impaired cognition, pain, impaired balance, and decreased activity tolerance impacting her independence and safety with ADL and functional mobility. Recommending CIR for follow up to maximize independence and safety with ADL and functional mobility prior to return home. Pt would benefit from continued skilled OT to address established goals.    Follow Up Recommendations  CIR;Supervision/Assistance - 24 hour    Equipment Recommendations  Other (comment) (TBD at next venue)    Recommendations for Other Services       Precautions / Restrictions Precautions Precautions: Fall Precaution Comments: SBP <140 Restrictions Weight Bearing Restrictions: No      Mobility Bed Mobility Overal bed mobility: Needs Assistance Bed Mobility: Supine to Sit     Supine to sit: Mod assist;HOB elevated     General bed mobility comments: Assist to elevate trunk to EOB. Truncal ataxia and right lateral lean.   Transfers Overall transfer level: Needs assistance Equipment used: Rolling walker (2 wheeled) Transfers: Sit to/from [facility] Sit to Stand: Mod assist;+2 safety/equipment Stand pivot transfers: Mod assist;+2 safety/equipment       General transfer comment: Assist to power to standing. Able to take a few steps to chair with Mod A for balance, LLE ataxia with advancement. +  dizziness and nausea.    Balance Overall balance assessment: Needs assistance Sitting-balance support: Feet supported;No upper extremity supported Sitting balance-Leahy Scale: Fair Sitting balance - Comments: Able to sit EOB with continued truncal ataxia. Postural control: Right lateral lean Standing balance support: During functional activity;Bilateral upper extremity supported Standing balance-Leahy Scale: Poor Standing balance comment: Reliant on UEs and external support for balance due to ataxia.                           ADL either performed or assessed with clinical judgement   ADL Overall ADL's : Needs assistance/impaired Eating/Feeding: Minimal assistance;Sitting   Grooming: Moderate assistance;Standing;Wash/dry hands Grooming Details (indicate cue type and reason): mod assist for standing balance. cues for sequencing Upper Body Bathing: Moderate assistance;Sitting   Lower Body Bathing: Maximal assistance;Sit to/from stand   Upper Body Dressing : Moderate assistance;Sitting   Lower Body Dressing: Maximal assistance;Sit to/from stand   Toilet Transfer: Maximal assistance;+2 for physical assistance;Ambulation;Comfort height toilet;Grab bars   Toileting- Clothing Manipulation and Hygiene: Maximal assistance;Sit to/from stand       Functional mobility during ADLs: Maximal assistance;+2 for physical assistance       Vision Patient Visual Report: No change from baseline Additional Comments: Needs further assessment     Perception     Praxis      Pertinent Vitals/Pain Pain Assessment: Faces Faces Pain Scale: Hurts even more Pain Location: headache Pain Descriptors / Indicators: Headache Pain Intervention(s): Limited activity within patient's tolerance;Monitored during session;Repositioned     Hand Dominance Left   Extremity/Trunk Assessment Upper Extremity Assessment Upper Extremity Assessment: LUE deficits/detail LUE Deficits / Details: Impaired  coordination; ataxia present. Strength  and ROM overall WFL. LUE Coordination: decreased gross motor;decreased fine motor   Lower Extremity Assessment Lower Extremity Assessment: Defer to PT evaluation   Cervical / Trunk Assessment Cervical / Trunk Assessment: Normal   Communication Communication Communication: No difficulties   Cognition Arousal/Alertness: Awake/alert Behavior During Therapy: WFL for tasks assessed/performed Overall Cognitive Status: Impaired/Different from baseline Area of Impairment: Attention;Following commands;Awareness;Problem solving                   Current Attention Level: Sustained   Following Commands: Follows one step commands inconsistently;Follows one step commands with increased time   Awareness: Emergent Problem Solving: Slow processing;Difficulty sequencing;Requires verbal cues;Requires tactile cues General Comments: patient having difficulty sequencing basic tasks such as hygiene performance or following commands from therapy team. Some confusion with directional instruction and delays with processing    General Comments       Exercises     Shoulder Instructions      Home Living Family/patient expects to be discharged to:: Private residence Living Arrangements: Children Available Help at Discharge: Family;Available PRN/intermittently Type of Home: House Home Access: Ramped entrance     Home Layout: One level     Bathroom Shower/Tub: Teacher, early years/pre: Handicapped height     Home Equipment: Clinical cytogeneticist - 2 wheels;Cane - single point          Prior Functioning/Environment Level of Independence: Independent with assistive device(s)        Comments: Pt uses SPC as needed. Drives.         OT Problem List: Decreased strength;Decreased activity tolerance;Impaired balance (sitting and/or standing);Decreased coordination;Decreased cognition;Decreased safety awareness;Decreased knowledge of use of  DME or AE;Impaired UE functional use;Pain      OT Treatment/Interventions: Self-care/ADL training;Therapeutic exercise;Neuromuscular education;Energy conservation;DME and/or AE instruction;Cognitive remediation/compensation;Patient/family education;Balance training    OT Goals(Current goals can be found in the care plan section) Acute Rehab OT Goals Patient Stated Goal: to return to independence OT Goal Formulation: With patient/family Time For Goal Achievement: 03/19/17 Potential to Achieve Goals: Good ADL Goals Pt Will Perform Grooming: with min guard assist;standing Pt Will Perform Upper Body Bathing: with set-up;with supervision;sitting Pt Will Perform Lower Body Bathing: with min guard assist;sit to/from stand Pt Will Transfer to Toilet: with min guard assist;ambulating;bedside commode Pt Will Perform Toileting - Clothing Manipulation and hygiene: with min guard assist;sit to/from stand  OT Frequency: Min 3X/week   Barriers to D/C:            Co-evaluation PT/OT/SLP Co-Evaluation/Treatment: Yes Reason for Co-Treatment: Complexity of the patient's impairments (multi-system involvement);Necessary to address cognition/behavior during functional activity;For patient/therapist safety;To address functional/ADL transfers   OT goals addressed during session: ADL's and self-care      AM-PAC PT "6 Clicks" Daily Activity     Outcome Measure Help from another person eating meals?: A Little Help from another person taking care of personal grooming?: A Lot Help from another person toileting, which includes using toliet, bedpan, or urinal?: A Lot Help from another person bathing (including washing, rinsing, drying)?: A Lot Help from another person to put on and taking off regular upper body clothing?: A Lot Help from another person to put on and taking off regular lower body clothing?: A Lot 6 Click Score: 13   End of Session Nurse Communication: Mobility status  Activity Tolerance:  Patient tolerated treatment well Patient left: in chair;with call bell/phone within reach;with chair alarm set;with family/visitor present  OT Visit Diagnosis: Unsteadiness on feet (R26.81);Other abnormalities  of gait and mobility (R26.89);Muscle weakness (generalized) (M62.81);Ataxia, unspecified (R27.0);Pain Pain - part of body:  (head)                Time: 5872-7618 OT Time Calculation (min): 23 min Charges:  OT General Charges $OT Visit: 1 Procedure OT Evaluation $OT Eval Moderate Complexity: 1 Procedure G-Codes:     Omarr Hann A. Ulice Brilliant, M.S., OTR/L Pager: [place] 03/05/2017, 9:34 PM

## 2017-03-05 NOTE — Plan of Care (Signed)
Problem: Nutrition: Goal: Risk of aspiration will decrease Outcome: Progressing Pt tolerates PO medications. Pt ate some of her breakfast this morning without difficulty.

## 2017-03-05 NOTE — Progress Notes (Signed)
STROKE TEAM PROGRESS NOTE   HISTORY OF PRESENT ILLNESS (per record) Ashley Chan is an 83 y.o. female with history of hypertension who presented to [facility] emergency room complaining of not feeling well. Apparently patient woke up in the morning not feeling well and then started noticing dizziness followed by nausea vomiting and some coffee-ground emesis. Initial head CT was negative. Unfortunately they were unable to get MRI secondary to no MRIs being done at [facility] over the weekend. 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 For this reason patient was transferred to [facility] for further evaluation and care.  Date last known well: Date: 03/02/2017 Time last known well: Unable to determine   Modified Rankin: Rankin Score=0  Patient was not administered IV t-PA secondary to hemorrhagic transformation/no time last known well. She was admitted to the neuro ICU for further evaluation and treatment.   SUBJECTIVE (INTERVAL HISTORY) Her grandaughter is at the bedside.  The patient is awake, alert, and follows commands appropriately.   BP controlled. F/u CT this am shows stable appearance of cerebellar hematoma OBJECTIVE Temp:  [98.4 F (36.9 C)-99.7 F (37.6 C)] 99.5 F (37.5 C) (08/08 1600) Pulse Rate:  [70-97] 72 (08/08 1500) Cardiac Rhythm: Normal sinus rhythm (08/08 0800) Resp:  [11-29] 18 (08/08 1500) BP: (121-208)/(52-149) 193/84 (08/08 1500) SpO2:  [90 %-100 %] 99 % (08/08 1500) Weight:  [159 lb 4.8 oz (72.3 kg)] 159 lb 4.8 oz (72.3 kg) (08/08 0530)  CBC:   Recent Labs Lab 03/04/17 0329  WBC 9.0  HGB 13.1  HCT 37.0  MCV 94.9  PLT 921    Basic Metabolic Panel:   Recent Labs Lab 03/04/17 0329  NA 136  K 4.5  CL 104  CO2 19*  GLUCOSE 117*  BUN 14  CREATININE 0.85  CALCIUM 8.3*    Lipid Panel:      Component Value Date/Time   CHOL 216 (H) 03/04/2017 0329   TRIG 969 (H) 03/04/2017 0329   HDL 26 (L) 03/04/2017 0329   CHOLHDL 8.3 03/04/2017 0329   VLDL UNABLE TO CALCULATE IF TRIGLYCERIDE OVER 400 mg/dL 03/04/2017 0329   LDLCALC UNABLE TO CALCULATE IF TRIGLYCERIDE OVER 400 mg/dL 03/04/2017 0329   HgbA1c:  Lab Results  Component Value Date   HGBA1C 5.4 03/04/2017   Urine Drug Screen: No results found for: LABOPIA, COCAINSCRNUR, LABBENZ, AMPHETMU, THCU, LABBARB  Alcohol Level No results found for: ETH  IMAGING  Ct Angio Head W Or Wo Contrast Ct Angio Neck W Or Wo Contrast 03/03/2017  IMPRESSION: CT head: 1. Left superior cerebellar hemorrhage, associated vasogenic edema, and mass effect are stable in comparison with prior MRI given differences in technique. 2. Stable mild superior transtentorial herniation, partial effacement of quadrigeminal plate cistern, and of fourth ventricle. No hydrocephalus. 3. No new acute intracranial abnormality. CTA neck: 1. Patent carotid and vertebral arteries of the neck. 2. Moderate right carotid bifurcation calcified plaque with mild 30% proximal ICA stenosis. 3. Severe left carotid bifurcation calcified plaque with severe 70% proximal ICA stenosis. 4. 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 5. Thyroid nodules measuring up to 15 mm. Further evaluation with thyroid ultrasound recommended. CTA head: 1. Patent circle of Willis. No large vessel occlusion, aneurysm, or vascular malformation identified. 2. Calcific plaque of the carotid siphons with  mild right and moderate to severe left paraclinoid stenosis. 3. No appreciable left AICA, possibly diminutive or occluded. These results will be called to the ordering clinician or representative by the Radiologist Assistant, and communication documented in the PACS or zVision Dashboard.  Ct Head Wo  Contrast 03/04/2017 IMPRESSION: 1. Slight interval increase in size of left superior cerebellar hematoma as above with slightly worsened associated vasogenic edema. Associated mass effect on the adjacent fourth ventricle slightly worsened, although remains patent at this time. No hydrocephalus. 2. No other new acute intracranial process.   Dg Chest Port 1 View 03/03/2017 IMPRESSION: Improved aeration of the left lower lung. Atelectasis remains at both lung bases.  TTE 03/04/2017  Left ventricle: The cavity size was normal. There was moderate   concentric hypertrophy. Systolic function was normal. The   estimated ejection fraction was in the range of 60% to 65%. Wall   motion was normal; there were no regional wall motion   abnormalities Carotid US 03/04/2017 The vertebral arteries appear patent with antegrade flow. - Findings consistent with a 1-39 percent stenosis involving the   right internal carotid artery. Right external artery demonstrates   elevated velocicites. - Findings consistent with a 1-39% stenosis by diastolic velocity,   peak systolic internal carotid artery velocities fall within the   60 - 79% stenosis range   PHYSICAL EXAM Pleasant elderly African-American lady currently not in distress. . Afebrile. Head is nontraumatic. Neck is supple without bruit.    Cardiac exam no murmur or gallop. Lungs are clear to auscultation. Distal pulses are well felt. Neurological Exam : Awake alert oriented 3. Diminished attention and recall. Follows commands well. Extraocular movements are full range but there is saccadic dysmetria on left gaze. Vision acuity and fields seem adequate. Face is symmetric without weakness. Tongue is midline. Mild weakness of left grip and intrinsic hand muscles. Fine finger movements are diminished on the left compared to the right. No upper or lower extremity drift noted. Mild left finger-to-nose and knee to heel dysmetria. Deep tendon flow is symmetric. Plantars  downgoing. Gait not tested. ASSESSMENT/PLAN Ashley Chan is a 83 y.o. female with history of hypertension presenting with dizziness, nausea, and coffee ground emesis.  She did not receive IV t-PA due to [place].   Stroke: Left superior cerebellar hematoma with increased vasogenic edema and mass effect on adjacent fourth ventricle without hydrocephalus.  Resultant  mild left hemi-ataxia  CT head: Slightly increased left superior cerebellar hematoma with slightly increased vasogenic edema.  Mass effect on adjacent fourth ventricle without hydrocephalus.    MRI head:  Done at [facility] reviewed  MRA head: not ordered  Carotid Doppler:  See [facility] Scarce 2D Echo  See above  LDL > 400  HgbA1c 5.4  SCDs for VTE prophylaxis Diet Heart Room service appropriate? Yes; Fluid consistency: Thin  No antithrombotic prior to admission, now on No antithrombotic due to hemorrhage  Patient counseled to be compliant with her antithrombotic medications  Ongoing aggressive stroke risk factor management  Therapy recommendations:   pending  Disposition:   pending  Hypertension  Unstable  On clevidipine gtt  Goal SBP < 180 mmHg  Long-term BP goal normotensive  Hyperlipidemia  Home meds: none  LDL > 400, goal < 70  Statin held due to [place]  Start statin prior to discharge  Diabetes  HgbA1c  5.4 goal < 7.0  Controlled  Other Stroke Risk Factors  Advanced age  Other Active Problems  None  Hospital  day # 2  I have personally examined this patient, reviewed notes, independently viewed imaging studies, participated in medical decision making and plan of care.ROS completed by me personally and pertinent positives fully documented  I have made any additions or clarifications directly to the above note.   She presented to [facility] with nausea, vomiting and dizziness and initial CT head was negative but MRI showed hemorrhagic infarct with cytotoxic edema. He remains at  risk for neurological worsening, developing hydrocephalus and respiratory failure and needs close neurological monitoring and aggressive blood pressure control. Repeat CT scan of the head without contrast shows stable hematoma and BP is controlled. Resume ho it theme BP meds. Mobilize out of bed as tolerated. Therapy and rehab consults.. Long discussion of the bedside with the patient and family and answered questions. This patient is critically ill and at significant risk of neurological worsening, death and care requires constant monitoring of vital signs, hemodynamics,respiratory and cardiac monitoring, extensive review of multiple databases, frequent neurological assessment, discussion with family, other specialists and medical decision making of high complexity.I have made any additions or clarifications directly to the above note.This critical care time does not reflect procedure time, or teaching time or supervisory time of PA/NP/Med Resident etc but could involve care discussion time.  I spent 30 minutes of neurocritical care time  in the care of  this patient.      Antony Contras, MD Medical Director [facility] Pager: [phone] 03/05/2017 6:13 PM   To contact Stroke Continuity provider, please refer to [email or web address] After hours, contact General Neurology

## 2017-03-05 NOTE — Consult Note (Signed)
Physical Medicine and Rehabilitation Consult   Reason for Consult: Cerebellar stroke with hemorraghic conversion Referring Physician: Dr. Leonel Ramsay.    HPI: Ashley Chan is a 83 y.o. left handed female who was admitted to OSH with reports of dizziness, malaise and coffec ground emesis. CT head unremarkable for acute process.  Unable to get MRI brain due to the weekend. 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 She was transferred to [facility] for treatment and care. She was started medications for tighter BP control and required fluid boluses due to poor UOP. Repeat CT head reviewed, stable midline shift.  Work up ongoing and PT evaluation done yesterday revealing balance deficits with ataxia, gaze instability and nausea affecting mobility. CIR recommended by MD and rehab team.    She lives with 2 daughters (one is disabled and uses walker). She furniture walked and has had to use a SPC for the past month due to right hip pain. Independent for ADLs. Children manage home.   Review of Systems  HENT: Positive for hearing loss. Negative for tinnitus.   Eyes: Negative for blurred vision and double vision.  Respiratory: Negative for cough and shortness of breath.   Cardiovascular: Negative for chest pain and palpitations.  Gastrointestinal: Positive for constipation. Negative for heartburn and nausea.  Genitourinary: Negative for dysuria and urgency.  Musculoskeletal: Positive for back pain and joint pain (right hip pain).  Skin: Negative for rash.  Neurological: Positive for dizziness, weakness and headaches.  Psychiatric/Behavioral: Negative for depression and suicidal ideas. Insomnia: due to anxiety/worries.   All other systems reviewed and are negative.     Past Medical History:  Diagnosis Date  . Hypertension     Past Surgical History:  Procedure Laterality Date  . BACK SURGERY   2011  . CATARACT EXTRACTION, BILATERAL  1999    Family History  Problem Relation Age of Onset  . Stroke Mother 18  . Lung disease Father        "black lung"      Social History:  Widowed. Lives with family. 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  She reports that she smoked for less than a year. Her smoking use included Cigarettes. She started smoking about 69 years ago. She has never used smokeless tobacco. She reports that she does not drink alcohol or use drugs.    Allergies  Allergen Reactions  . Benzyl Alcohol     Reaction (?)  . Ethanol     Reaction (?)  . Lidocaine     Reaction (?)  . Propranolol     Reaction (?)  . Sulfa Antibiotics     Reaction (?)    Medications Prior to Admission  Medication Sig Dispense Refill  . amLODipine (NORVASC) 5 MG tablet Take 5 mg by mouth daily.  6  . aspirin EC 81 MG tablet Take 81 mg by mouth daily with breakfast.    . clonazePAM (KLONOPIN) 0.5 MG tablet Take 0.5 mg by mouth daily as needed for anxiety.    . diclofenac (VOLTAREN) 75 MG EC tablet Take 75 mg by mouth 2 (two) times daily. WITH FOOD OR MILK  5  . diclofenac sodium (VOLTAREN) 1 % GEL Apply 1 g topically 2 (two) times daily as needed (for pain or inflammation). TO AFFECTED AREAS  2  . fluticasone (FLONASE) 50 MCG/ACT nasal spray Place 2 sprays into both nostrils daily  as needed for allergies or rhinitis.   3  . hydrochlorothiazide (HYDRODIURIL) 12.5 MG tablet Take 12.5 mg by mouth daily.  3  . labetalol (NORMODYNE) 300 MG tablet Take 300 mg by mouth 2 (two) times daily.    Marland Kitchen PROAIR HFA 108 (90 Base) MCG/ACT inhaler Inhale 1 puff into the lungs every 6 (six) hours as needed for shortness of breath or wheezing.  3  . traMADol (ULTRAM) 50 MG tablet Take 50 mg by mouth 2 (two) times daily as needed for pain.  0  . Vitamin D, Ergocalciferol, (DRISDOL) 50000 units CAPS capsule Take 50,000 Units by mouth every 7 (seven) days.  6    Home: Home  Living Family/patient expects to be discharged to:: Private residence Living Arrangements: Children (with 2 daughters) Available Help at Discharge: Family, Available PRN/intermittently Type of Home: House Home Access: Ramped entrance [place]: One level Bathroom Shower/Tub: Tub/shower unit Home Equipment: Civil engineer, contracting, Environmental consultant - 2 wheels, [place] - single point  Functional History: Prior Function Level of Independence: Independent with assistive device(s) Comments: Pt uses SPC as needed. Drives.  Functional Status:  Mobility: Bed Mobility Overal bed mobility: Needs Assistance Bed Mobility: Supine to Sit Supine to sit: Mod assist, HOB elevated General bed mobility comments: Assist to elevate trunk to EOB. Truncal ataxia and right lateral lean.  Transfers Overall transfer level: Needs assistance Equipment used: Rolling walker (2 wheeled) Transfers: Sit to/from Stand, [employer] Transfers Sit to Stand: Mod assist, +2 safety/equipment Stand pivot transfers: Mod assist, +2 safety/equipment General transfer comment: Assist to power to standing. Able to take a few steps to chair with Mod A for balance, LLE ataxia with advancement. + dizziness and nausea. Ambulation/Gait General Gait Details: Deferred due to safety/nausea.    ADL:    Cognition: Cognition Overall Cognitive Status: Within Functional Limits for tasks assessed Orientation Level: Oriented X4 Cognition Arousal/Alertness: Awake/alert Behavior During Therapy: WFL for tasks assessed/performed Overall Cognitive Status: Within Functional Limits for tasks assessed  Blood pressure (!) 180/75, pulse 95, temperature 99.6 F (37.6 C), temperature source Oral, resp. rate 16, height 5\' 7"  (1.702 m), weight 72.3 kg (159 lb 4.8 oz), SpO2 98 %. Physical Exam  Vitals reviewed. Constitutional: She is oriented to person, place, and time. She appears well-developed and well-nourished.  HENT:  Head: Normocephalic and atraumatic.   Eyes: EOM are normal. Right eye exhibits no discharge. Left eye exhibits no discharge.  Neck: Normal range of motion. Neck supple.  Cardiovascular: Normal rate and regular rhythm.   Respiratory: Effort normal and breath sounds normal.  GI: Soft. Bowel sounds are normal.  Musculoskeletal: She exhibits edema. She exhibits no tenderness.  Neurological: She is alert and oriented to person, place, and time.  Motor: 5/5 throughout RUE: Ataxia LUE: Ataxia and dysmetria (worse than right) Sensation intact to light touch  Skin: Skin is warm and dry.  Psychiatric: She has a normal mood and affect. Her behavior is normal. Thought content normal.    No results found for this or any previous visit (from the past 24 hour(s)). Ct Angio Head W Or Wo Contrast  Result Date: 03/03/2017 CLINICAL DATA:  Intracranial hemorrhage for follow-up. EXAM: CT ANGIOGRAPHY HEAD AND NECK TECHNIQUE: Multidetector CT imaging of the head and neck was performed using the standard protocol during bolus administration of intravenous contrast. Multiplanar CT image reconstructions and MIPs were obtained to evaluate the vascular anatomy. Carotid stenosis measurements (when applicable) are obtained utilizing NASCET criteria, using the distal internal carotid diameter  as the denominator. CONTRAST:  50 cc Isovue 370 COMPARISON:  03/03/2017 MRI of the head and 03/01/2017 CT of the head. FINDINGS: CT HEAD FINDINGS Brain: Left superior cerebellar hematoma, surrounding vasogenic edema, and local mass effect are stable from the prior MRI given differences in technique. There is superior transtentorial herniation with partial effacement of the quadrigeminal plate cistern as well as effacement of fourth ventricle that is stable. Ventricle size is unchanged. No evidence for new acute intracranial hemorrhage or infarct. Stable background of chronic microvascular ischemic changes and parenchymal volume loss of the brain. Vascular: As below. Skull:  Normal. Negative for fracture or focal lesion. Sinuses: Imaged portions are clear. Orbits: No acute finding. Review of the MIP images confirms the above findings CTA NECK FINDINGS Aortic arch: Bovine arch.  Mild calcific atherosclerosis. Right carotid system: No evidence of dissection, stenosis (50% or greater) or occlusion. Moderate calcified plaque of the carotid bifurcation with 30% proximal ICA stenosis. Left carotid system: No evidence for dissection or occlusion. Severe calcific atherosclerosis of the left carotid bifurcation moderate 50% distal common carotid and severe 70% proximal ICA stenosis. Additionally, there is severe 70% stenosis of the external carotid artery origin. Vertebral arteries: Right dominant. No evidence of dissection, stenosis (50% or greater) or occlusion. Skeleton: Multilevel cervical spondylosis with predominantly discogenic degenerative disease. From the C4-5 to C5-6 levels there is ossification of the posterior longitudinal ligament and the ligamentum flavum with severe canal stenosis. Other neck: Thyroid nodules measuring up to 15 mm in the left lobe. Upper chest: Negative. Review of the MIP images confirms the above findings CTA HEAD FINDINGS Anterior circulation: No proximal occlusion, aneurysm, or vascular malformation. Extensive calcific atherosclerosis of carotid siphons with mild right and moderate to severe left paraclinoid stenosis. Posterior circulation: No significant stenosis, proximal occlusion, aneurysm, or vascular malformation. Bilateral PICA and AICA are patent. A small right AICA is identified. No left AICA is identified, possibly diminutive or occluded. Venous sinuses: As permitted by contrast timing, patent. Anatomic variants: Large right A1 and anterior communicating artery with no appreciable left A1, normal variant. Patent right posterior communicating artery. Diminutive left posterior communicating artery. Delayed phase: No abnormal intracranial enhancement.  Review of the MIP images confirms the above findings IMPRESSION: CT head: 1. Left superior cerebellar hemorrhage, associated vasogenic edema, and mass effect are stable in comparison with prior MRI given differences in technique. 2. Stable mild superior transtentorial herniation, partial effacement of quadrigeminal plate cistern, and of fourth ventricle. No hydrocephalus. 3. No new acute intracranial abnormality. CTA neck: 1. Patent carotid and vertebral arteries of the neck. 2. Moderate right carotid bifurcation calcified plaque with mild 30% proximal ICA stenosis. 3. Severe left carotid bifurcation calcified plaque with severe 70% proximal ICA stenosis. 4. 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 5. Thyroid nodules measuring up to 15 mm. Further evaluation with thyroid ultrasound recommended. CTA head: 1. Patent circle of Willis. No large vessel occlusion, aneurysm, or vascular malformation identified. 2. Calcific plaque of the carotid siphons with mild right and moderate to severe left paraclinoid stenosis. 3. No appreciable left AICA, possibly diminutive or occluded. These results will be called to the ordering clinician or representative by the Radiologist Assistant, and communication documented in the PACS or zVision Dashboard. Electronically Signed   By: Kristine Garbe M.D.   On: 03/03/2017 18:52   Ct Head Wo Contrast  Result Date: 03/05/2017 CLINICAL DATA:  Stroke, follow-up evaluation.  EXAM: CT HEAD WITHOUT CONTRAST TECHNIQUE: Contiguous axial images were obtained from the base of the skull through the vertex without intravenous contrast. COMPARISON:  CT HEAD March 04, 2017 FINDINGS: BRAIN: Stable evolving LEFT cerebellar 2.7 x 2.7 cm hematoma. Similar surrounding low-density vasogenic edema partially effacing the fourth ventricle. No hydrocephalus. Patchy to confluent supratentorial white  matter hypodensities. No supratentorial midline shift. No acute large vascular territory infarct. No abnormal extra-axial fluid collections. Basal cisterns are patent. VASCULAR: Moderate calcific atherosclerosis of the carotid siphons. SKULL: No skull fracture. 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 No significant scalp soft tissue swelling. SINUSES/ORBITS: Trace LEFT maxillary sinus mucosal thickening. Mastoid air cells are well aerated. The included ocular globes and orbital contents are non-suspicious. OTHER: None. IMPRESSION: 1. Evolving LEFT cerebellar 2.7 x 2.7 cm intraparenchymal hematoma. Partially effaced fourth ventricle without hydrocephalus. 2. Moderate to severe chronic small vessel ischemic disease. Electronically Signed   By: Elon Alas M.D.   On: 03/05/2017 04:52   Ct Head Wo Contrast  Result Date: 03/04/2017 CLINICAL DATA:  Follow-up exam for cerebral hemorrhage. EXAM: CT HEAD WITHOUT CONTRAST TECHNIQUE: Contiguous axial images were obtained from the base of the skull through the vertex without intravenous contrast. COMPARISON:  Prior CT from 03/03/2017. FINDINGS: Brain: Left superior cerebellar hematoma is slightly increased in size measuring 2.7 x 2.6 x 1.6 cm, previously 2.4 x 2.2 x 1.5 cm). Surrounding low-density vasogenic edema is slightly worsened. Associated mass effect on the adjacent fourth ventricle is also slightly worsened, although the fourth ventricle remains patent. Supra tentorial herniation with partial effacement of the left quadrigeminal plate cistern again noted, similar. Ventricular size is unchanged without hydrocephalus. No other new acute intracranial hemorrhage. No acute large vessel territory infarct. No midline shift. No extra-axial fluid collection. Underlying atrophy with chronic small vessel ischemic disease again noted. Vascular: No hyperdense vessel.  Intracranial atherosclerosis noted. Skull: Scalp soft tissues and calvarium within normal  limits. Sinuses/Orbits: Globes and orbital soft tissues normal. Mild mucosal thickening within the left maxillary sinus. Paranasal sinuses are otherwise clear. Torus palatini noted. IMPRESSION: 1. Slight interval increase in size of left superior cerebellar hematoma as above with slightly worsened associated vasogenic edema. Associated mass effect on the adjacent fourth ventricle slightly worsened, although remains patent at this time. No hydrocephalus. 2. No other new acute intracranial process. Electronically Signed   By: Jeannine Boga M.D.   On: 03/04/2017 07:23   Ct Angio Neck W Or Wo Contrast  Result Date: 03/03/2017 CLINICAL DATA:  Intracranial hemorrhage for follow-up. EXAM: CT ANGIOGRAPHY HEAD AND NECK TECHNIQUE: Multidetector CT imaging of the head and neck was performed using the standard protocol during bolus administration of intravenous contrast. Multiplanar CT image reconstructions and MIPs were obtained to evaluate the vascular anatomy. Carotid stenosis measurements (when applicable) are obtained utilizing NASCET criteria, using the distal internal carotid diameter as the denominator. CONTRAST:  50 cc Isovue 370 COMPARISON:  03/03/2017 MRI of the head and 03/01/2017 CT of the head. FINDINGS: CT HEAD FINDINGS Brain: Left superior cerebellar hematoma, surrounding vasogenic edema, and local mass effect are stable from the prior MRI given differences in technique. There is superior transtentorial herniation with partial effacement of the quadrigeminal plate cistern as well as effacement of fourth ventricle that is stable. Ventricle size is unchanged. No evidence for new acute intracranial hemorrhage or infarct. Stable background of chronic microvascular ischemic changes and parenchymal volume loss of the brain. Vascular: As below. Skull: Normal. Negative for fracture or focal lesion. Sinuses:  Imaged portions are clear. Orbits: No acute finding. Review of the MIP images confirms the above  findings CTA NECK FINDINGS Aortic arch: Bovine arch.  Mild calcific atherosclerosis. Right carotid system: No evidence of dissection, stenosis (50% or greater) or occlusion. Moderate calcified plaque of the carotid bifurcation with 30% proximal ICA stenosis. Left carotid system: No evidence for dissection or occlusion. Severe calcific atherosclerosis of the left carotid bifurcation moderate 50% distal common carotid and severe 70% proximal ICA stenosis. Additionally, there is severe 70% stenosis of the external carotid artery origin. Vertebral arteries: Right dominant. No evidence of dissection, stenosis (50% or greater) or occlusion. Skeleton: Multilevel cervical spondylosis with predominantly discogenic degenerative disease. From the C4-5 to C5-6 levels there is ossification of the posterior longitudinal ligament and the ligamentum flavum with severe canal stenosis. Other neck: Thyroid nodules measuring up to 15 mm in the left lobe. Upper chest: Negative. Review of the MIP images confirms the above findings CTA HEAD FINDINGS Anterior circulation: No proximal occlusion, aneurysm, or vascular malformation. Extensive calcific atherosclerosis of carotid siphons with mild right and moderate to severe left paraclinoid stenosis. Posterior circulation: No significant stenosis, proximal occlusion, aneurysm, or vascular malformation. Bilateral PICA and AICA are patent. A small right AICA is identified. No left AICA is identified, possibly diminutive or occluded. Venous sinuses: As permitted by contrast timing, patent. Anatomic variants: Large right A1 and anterior communicating artery with no appreciable left A1, normal variant. Patent right posterior communicating artery. Diminutive left posterior communicating artery. Delayed phase: No abnormal intracranial enhancement. Review of the MIP images confirms the above findings IMPRESSION: CT head: 1. Left superior cerebellar hemorrhage, associated vasogenic edema, and mass  effect are stable in comparison with prior MRI given differences in technique. 2. Stable mild superior transtentorial herniation, partial effacement of quadrigeminal plate cistern, and of fourth ventricle. No hydrocephalus. 3. No new acute intracranial abnormality. CTA neck: 1. Patent carotid and vertebral arteries of the neck. 2. Moderate right carotid bifurcation calcified plaque with mild 30% proximal ICA stenosis. 3. Severe left carotid bifurcation calcified plaque with severe 70% proximal ICA stenosis. 4. 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 5. Thyroid nodules measuring up to 15 mm. Further evaluation with thyroid ultrasound recommended. CTA head: 1. Patent circle of Willis. No large vessel occlusion, aneurysm, or vascular malformation identified. 2. Calcific plaque of the carotid siphons with mild right and moderate to severe left paraclinoid stenosis. 3. No appreciable left AICA, possibly diminutive or occluded. These results will be called to the ordering clinician or representative by the Radiologist Assistant, and communication documented in the PACS or zVision Dashboard. Electronically Signed   By: Kristine Garbe M.D.   On: 03/03/2017 18:52   Dg Chest Port 1 View  Result Date: 03/03/2017 CLINICAL DATA:  Vomiting and headache.  Left cerebellar hemorrhage. EXAM: PORTABLE CHEST 1 VIEW COMPARISON:  03/01/2017 FINDINGS: The heart size and mediastinal contours are within normal limits. Improved aeration of the left lower lobe with mild atelectasis remaining. Minimal atelectasis at the right lung base. There is no evidence of pulmonary edema, consolidation, pneumothorax, nodule or pleural fluid. The visualized skeletal structures are unremarkable. IMPRESSION: Improved aeration of the left lower lung. Atelectasis remains at both lung bases. Electronically Signed   By: Aletta Edouard M.D.    On: 03/03/2017 20:18    Assessment/Plan: Diagnosis: Left cerebellar infarct with hemorrhagic conversion and midline shift Labs and images independently reviewed.  Records reviewed and summated above. Stroke: Continue secondary stroke prophylaxis and Risk Factor Modification listed below:   Blood Pressure Management:  Continue current medication with prn's with permisive HTN per primary team Statin Agent:   Diabetes management:    1. Does the need for close, 24 hr/day medical supervision in concert with the patient's rehab needs make it unreasonable for this patient to be served in a less intensive setting? Yes 2. Co-Morbidities requiring supervision/potential complications: HTN (monitor and provide prns in accordance with increased physical exertion and pain, wean IV meds as appropriate), chronic back pain (Biofeedback training with therapies to help reduce reliance on opiate pain medications, monitor pain control during therapies, and sedation at rest and titrate to maximum efficacy to ensure participation and gains in therapies), anxiety (ensure anxiety and resulting apprehension do not limit functional progress; consider prn medications if warranted) 3. Due to safety, disease management and patient education, does the patient require 24 hr/day rehab nursing? Yes 4. Does the patient require coordinated care of a physician, rehab nurse, PT (1-2 hrs/day, 5 days/week) and OT (1-2 hrs/day, 5 days/week) to address physical and functional deficits in the context of the above medical diagnosis(es)? Yes Addressing deficits in the following areas: balance, endurance, locomotion, transferring, bathing, dressing, toileting and psychosocial support 5. Can the patient actively participate in an intensive therapy program of at least 3 hrs of therapy per day at least 5 days per week? Yes 6. The potential for patient to make measurable gains while on inpatient rehab is excellent 7. Anticipated functional  outcomes upon discharge from inpatient rehab are supervision and min assist  with PT, supervision and min assist with OT, n/a with SLP. 8. Estimated rehab length of stay to reach the above functional goals is: 14-18 days. 9. Anticipated D/C setting: Other 10. Anticipated post D/C treatments: SNF 11. Overall Rehab/Functional Prognosis: good  RECOMMENDATIONS: This patient's condition is appropriate for continued rehabilitative care in the following setting: CIR Patient has agreed to participate in recommended program. Yes Note that insurance prior authorization may be required for reimbursement for recommended care.  Comment: Rehab Admissions Coordinator to follow up.  Delice Lesch, MD, [place], [place] 03/05/2017

## 2017-03-06 MED ORDER — ENOXAPARIN SODIUM 40 MG/0.4ML ~~LOC~~ SOLN
40.0000 mg | SUBCUTANEOUS | Status: DC
  Administered 2017-03-06 – 2017-03-07 (×2): 40 mg via SUBCUTANEOUS
  Filled 2017-03-06 (×2): qty 0.4

## 2017-03-06 MED ORDER — HYDRALAZINE HCL 20 MG/ML IJ SOLN
5.0000 mg | INTRAMUSCULAR | Status: DC | PRN
  Administered 2017-03-06: 5 mg via INTRAVENOUS
  Filled 2017-03-06: qty 1

## 2017-03-06 MED ORDER — HYDROCHLOROTHIAZIDE 25 MG PO TABS
25.0000 mg | ORAL_TABLET | Freq: Every day | ORAL | Status: DC
  Administered 2017-03-07 – 2017-03-09 (×3): 25 mg via ORAL
  Filled 2017-03-06 (×3): qty 1

## 2017-03-06 MED ORDER — HYDROCHLOROTHIAZIDE 12.5 MG PO CAPS
12.5000 mg | ORAL_CAPSULE | Freq: Once | ORAL | Status: AC
  Administered 2017-03-06: 12.5 mg via ORAL
  Filled 2017-03-06: qty 1

## 2017-03-06 NOTE — Progress Notes (Signed)
RN notified that pt was diaphoretic, bradycardic and SBP 91 MAP 61 while in chair having BM. Patient neurologically stable, cool rag applied, BP retaken, SBP 140s. Patient able to continue tolerating chair well with no further distress. No c/o chest pain or SOB. MD made aware of ?vagal episode. Will continue to monitor.

## 2017-03-06 NOTE — Evaluation (Signed)
Speech Language Pathology Evaluation Patient Details Name: Ashley Chan MRN: [id number] DOB: Oct 09, 1928 Today's Date: 03/06/2017 Time: 9326-7124 SLP Time Calculation (min) (ACUTE ONLY): 27 min  Problem List:  Patient Active Problem List   Diagnosis Date Noted  . Benign essential HTN   . Chronic midline low back pain without sciatica   . Anxiety state   . Ataxia, post-stroke   . ICH (intracerebral hemorrhage) ([place]) 03/03/2017  . CVA (cerebral vascular accident) ([place]) 03/03/2017   Past Medical History:  Past Medical History:  Diagnosis Date  . Anxiety   . Back pain   . Hypertension   . Right hip pain    Past Surgical History:  Past Surgical History:  Procedure Laterality Date  . BACK SURGERY  2011  . CATARACT EXTRACTION, BILATERAL  1999   HPI:  Patient is a 83 y/o female who presents with dizziness, N/V and some coffee-ground emesis. 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 PMH includes HTN.    Assessment / Plan / Recommendation Clinical Impression  Pt scored an 18/22 on the MOCA-Blind, but in informal assessment she shows impaired intelelctual and emergent awareness, functional problem solving, and recall of information. PT/OT evaluations also document impaired cognition within functional contexts. She will benefit from SLP f/u to maximize safety.    SLP Assessment  SLP Recommendation/Assessment: Patient needs continued Speech Lanaguage Pathology Services SLP Visit Diagnosis: Cognitive communication deficit (R41.841)    Follow Up Recommendations  Inpatient Rehab    Frequency and Duration min 2x/week  2 weeks      SLP Evaluation Cognition  Overall Cognitive Status: Impaired/Different from baseline Arousal/Alertness: Awake/alert Orientation Level: Oriented X4 Attention: Sustained Sustained Attention: Impaired Sustained Attention Impairment: Verbal complex Memory:  Impaired Memory Impairment: Storage deficit;Retrieval deficit;Decreased recall of new information Awareness: Impaired Awareness Impairment: Intellectual impairment;Emergent impairment;Anticipatory impairment Problem Solving: Impaired Problem Solving Impairment: Functional basic Safety/Judgment: Impaired       Comprehension  Auditory Comprehension Overall Auditory Comprehension: Appears within functional limits for tasks assessed (basic tasks)    Expression Expression Primary Mode of Expression: Verbal Verbal Expression Overall Verbal Expression: Appears within functional limits for tasks assessed Written Expression Dominant Hand: Left   Oral / Motor  Motor Speech Overall Motor Speech: Appears within functional limits for tasks assessed   GO                    Germain Osgood 03/06/2017, 11:20 AM   Germain Osgood, M.A. CCC-SLP [phone]

## 2017-03-06 NOTE — Progress Notes (Signed)
Inpatient Rehabilitation  Met with patient and daughter, Bonnita Nasuti, who lives with patient.  We discussed the team's recommendation for IP Rehab for post acute therapy, which they report they are in favor of.  Shared booklets and answered questions.  Plan to follow along for timing of medical readiness and bed availability.  Please call with questions.  Carmelia Roller., CCC/SLP Admission Coordinator  [facility]  Cell [phone]

## 2017-03-06 NOTE — Progress Notes (Signed)
STROKE TEAM PROGRESS NOTE   SUBJECTIVE (INTERVAL HISTORY) Ashley Chan Ashley Chan are at the bedside.  The patient is awake, alert, Ashley follows commands appropriately.  Hypertensive overnight Ashley re-started on clevidipine gtt.  Off clevidipine gtt since 1050 today.  HCTZ 12.5mg   PO daily increased to 25mg  PO daily.  BP now at goal.  Discontinued PRN clevidipine order.  Added hydralazine PRN.  Started on [place] enoxaparin today.  Awaiting CIR bed.     OBJECTIVE Temp:  [98 F (36.7 C)-99.5 F (37.5 C)] 98 F (36.7 C) (08/09 1200) Pulse Rate:  [66-85] 72 (08/09 1215) Cardiac Rhythm: Normal sinus rhythm (08/09 0600) Resp:  [12-22] 13 (08/09 1215) BP: (131-208)/(56-160) 165/80 (08/09 1215) SpO2:  [97 %-100 %] 99 % (08/09 1215)  CBC:   Recent Labs Lab 03/04/17 0329  WBC 9.0  HGB 13.1  HCT 37.0  MCV 94.9  PLT 798    Basic Metabolic Panel:   Recent Labs Lab 03/04/17 0329  NA 136  K 4.5  CL 104  CO2 19*  GLUCOSE 117*  BUN 14  CREATININE 0.85  CALCIUM 8.3*    Lipid Panel:     Component Value Date/Time   CHOL 216 (H) 03/04/2017 0329   TRIG 969 (H) 03/04/2017 0329   HDL 26 (L) 03/04/2017 0329   CHOLHDL 8.3 03/04/2017 0329   VLDL UNABLE TO CALCULATE IF TRIGLYCERIDE OVER 400 mg/dL 03/04/2017 0329   LDLCALC UNABLE TO CALCULATE IF TRIGLYCERIDE OVER 400 mg/dL 03/04/2017 0329   HgbA1c:  Lab Results  Component Value Date   HGBA1C 5.4 03/04/2017   Urine Drug Screen: No results found for: LABOPIA, COCAINSCRNUR, LABBENZ, AMPHETMU, THCU, LABBARB  Alcohol Level No results found for: ETH  IMAGING  Ct Angio Head W Or Wo Contrast Ct Angio Neck W Or Wo Contrast 03/03/2017  IMPRESSION: CT head: 1. Left superior cerebellar hemorrhage, associated vasogenic edema, Ashley mass effect are stable in comparison with prior MRI given differences in technique. 2. Stable mild superior transtentorial herniation, partial effacement of quadrigeminal plate cistern, Ashley of fourth ventricle. No  hydrocephalus. 3. No new acute intracranial abnormality. CTA neck: 1. Patent carotid Ashley vertebral arteries of the neck. 2. Moderate right carotid bifurcation calcified plaque with mild 30% proximal ICA stenosis. 3. Severe left carotid bifurcation calcified plaque with severe 70% proximal ICA stenosis. 4. 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 5. Thyroid nodules measuring up to 15 mm. Further evaluation with thyroid ultrasound recommended. CTA head: 1. Patent circle of Willis. No large vessel occlusion, aneurysm, or vascular malformation identified. 2. Calcific plaque of the carotid siphons with mild right Ashley moderate to severe left paraclinoid stenosis. 3. No appreciable left AICA, possibly diminutive or occluded. These results will be called to the ordering clinician or representative by the Radiologist Assistant, Ashley communication documented in the PACS or zVision Dashboard.  Ct Head Wo Contrast 03/04/2017 IMPRESSION: 1. Slight interval increase in size of left superior cerebellar hematoma as above with slightly worsened associated vasogenic edema. Associated mass effect on the adjacent fourth ventricle slightly worsened, although remains patent at this time. No hydrocephalus. 2. No other new acute intracranial process.   Dg Chest Port 1 View 03/03/2017 IMPRESSION: Improved aeration of the left lower lung. Atelectasis remains at both lung bases.  TTE 03/04/2017  Left ventricle: The cavity size was normal. There was moderate   concentric hypertrophy. Systolic function was normal. The   estimated  ejection fraction was in the range of 60% to 65%. Wall   motion was normal; there were no regional wall motion   abnormalities  Carotid US 03/04/2017 The vertebral arteries appear patent with antegrade flow. - Findings consistent with a 1-39 percent stenosis involving the   right internal carotid  artery. Right external artery demonstrates   elevated velocicites. - Findings consistent with a 1-39% stenosis by diastolic velocity,   peak systolic internal carotid artery velocities fall within the   60 - 79% stenosis range   PHYSICAL EXAM Ashley Chan currently not in distress.  Afebrile. Head is nontraumatic. Neck is supple without bruit.    Cardiac exam no murmur or gallop. Lungs are clear to auscultation. Distal pulses are well felt. Neurological Exam : Awake alert oriented 3. Diminished attention Ashley recall. Follows commands well. Extraocular movements are full range but there is saccadic dysmetria on left gaze. Vision acuity Ashley fields seem adequate. Face is symmetric without weakness. Tongue is midline. Mild weakness of left grip Ashley intrinsic hand muscles. Fine finger movements are diminished on the left compared to the right. No upper or lower extremity drift noted. Mild left finger-to-nose Ashley knee to heel dysmetria. Deep tendon flow is symmetric. Plantars downgoing. Gait not tested. ASSESSMENT/PLAN Ashley Chan is a 83 y.o. female with history of hypertension presenting with dizziness, nausea, Ashley coffee ground emesis.  She did not receive IV t-PA due to [place].   Stroke: Left superior cerebellar hematoma with increased vasogenic edema Ashley mass effect on adjacent fourth ventricle without hydrocephalus.  Resultant  mild left hemi-ataxia  CT head: Slightly increased left superior cerebellar hematoma with slightly increased vasogenic edema.  Mass effect on adjacent fourth ventricle without hydrocephalus.    MRI head:  Done at [facility] reviewed  MRA head: not ordered  Carotid Doppler: EF 60-65%. No source of embolus  2D Echo: B ICA 1-39% stenosis, VAs antegrade  LDL > 400  HgbA1c 5.4  SCDs for VTE prophylaxis Diet Heart Room service appropriate? Yes; Fluid consistency: Thin  No antithrombotic prior to admission, now on No  antithrombotic due to hemorrhage  Patient counseled to be compliant with Ashley antithrombotic medications  Ongoing aggressive stroke risk factor management  Therapy recommendations: CIR  Disposition: Discharge Ashley readmit to inpatient rehab  Hypertension  Unstable  On clevidipine gtt Goal SBP < 180 mmHg Long-term BP goal normotensive  Hyperlipidemia  Home meds: none  LDL > 400, goal < 70  Statin held due to [place]  Start statin prior to discharge  Diabetes  HgbA1c  5.4 goal < 7.0  Controlled  Other Stroke Risk Factors  Advanced age  Other Active Problems  None  Hospital day # 3  I have personally examined this patient, reviewed notes, independently viewed imaging studies, participated in medical decision making Ashley plan of care.ROS completed by me personally Ashley pertinent positives fully documented  I have made any additions or clarifications directly to the above note.   Plan mobilize out of bed. BP is controlled. Increase HCTZ dose to 25 mg.transfer to neuro floor bed.. Therapy Ashley rehab consults.. Long discussion of the bedside with the patient Ashley family Ashley answered questions. Greater than 50% time during this 35 minute visit was spent on counseling Ashley coordination of care about Ashley hemorrhagic stroke, blood pressure control Ashley discussion about rehabilitation needs Ashley answered questions       Antony Contras, MD Medical Director [place] Pager: [phone] 03/06/2017  12:56 PM   To contact Stroke Continuity provider, please refer to [email or web address] After hours, contact General Neurology

## 2017-03-07 LAB — PROTIME-INR
INR: 1.06
Prothrombin Time: 13.8 seconds (ref 11.4–15.2)

## 2017-03-07 LAB — BASIC METABOLIC PANEL
ANION GAP: 10 (ref 5–15)
BUN: 11 mg/dL (ref 6–20)
CALCIUM: 8.8 mg/dL — AB (ref 8.9–10.3)
CHLORIDE: 102 mmol/L (ref 101–111)
CO2: 27 mmol/L (ref 22–32)
CREATININE: 0.86 mg/dL (ref 0.44–1.00)
GFR calc Af Amer: >60 mL/min (ref >60)
GFR calc non Af Amer: 60 mL/min — ABNORMAL LOW (ref >60)
GLUCOSE: 110 mg/dL — AB (ref 65–99)
Potassium: 3.1 mmol/L — ABNORMAL LOW (ref 3.5–5.1)
Sodium: 139 mmol/L (ref 135–145)

## 2017-03-07 LAB — CBC WITH DIFFERENTIAL/PLATELET
Basophils Absolute: 0 10*3/uL (ref 0.0–0.1)
Basophils Relative: 0 %
EOS PCT: 6 %
Eosinophils Absolute: 0.4 10*3/uL (ref 0.0–0.7)
HEMATOCRIT: 34.9 % — AB (ref 36.0–46.0)
Hemoglobin: 12.2 g/dL (ref 12.0–15.0)
LYMPHS ABS: 2 10*3/uL (ref 0.7–4.0)
LYMPHS PCT: 31 %
MCH: 32.2 pg (ref 26.0–34.0)
MCHC: 35 g/dL (ref 30.0–36.0)
MCV: 92.1 fL (ref 78.0–100.0)
MONO ABS: 0.6 10*3/uL (ref 0.1–1.0)
MONOS PCT: 9 %
NEUTROS ABS: 3.5 10*3/uL (ref 1.7–7.7)
Neutrophils Relative %: 54 %
PLATELETS: 240 10*3/uL (ref 150–400)
RBC: 3.79 MIL/uL — ABNORMAL LOW (ref 3.87–5.11)
RDW: 13.5 % (ref 11.5–15.5)
WBC: 6.5 10*3/uL (ref 4.0–10.5)

## 2017-03-07 LAB — OCCULT BLOOD X 1 CARD TO LAB, STOOL: Fecal Occult Bld: POSITIVE — AB

## 2017-03-07 MED ORDER — ASPIRIN EC 81 MG PO TBEC
81.0000 mg | DELAYED_RELEASE_TABLET | Freq: Every day | ORAL | Status: DC
  Administered 2017-03-08: 81 mg via ORAL
  Filled 2017-03-07: qty 1

## 2017-03-07 MED ORDER — VITAMIN D (ERGOCALCIFEROL) 1.25 MG (50000 UNIT) PO CAPS
50000.0000 [IU] | ORAL_CAPSULE | ORAL | Status: DC
  Administered 2017-03-08: 50000 [IU] via ORAL
  Filled 2017-03-07: qty 1

## 2017-03-07 MED ORDER — AMLODIPINE BESYLATE 10 MG PO TABS
10.0000 mg | ORAL_TABLET | ORAL | Status: AC
  Administered 2017-03-07: 10 mg via ORAL
  Filled 2017-03-07: qty 1

## 2017-03-07 MED ORDER — POTASSIUM CHLORIDE 20 MEQ/15ML (10%) PO SOLN
40.0000 meq | Freq: Once | ORAL | Status: DC
  Administered 2017-03-08: 40 meq via ORAL
  Filled 2017-03-07: qty 30

## 2017-03-07 MED ORDER — AMLODIPINE BESYLATE 10 MG PO TABS
10.0000 mg | ORAL_TABLET | Freq: Every day | ORAL | Status: DC
  Administered 2017-03-08 – 2017-03-09 (×2): 10 mg via ORAL
  Filled 2017-03-07 (×2): qty 1

## 2017-03-07 NOTE — Progress Notes (Signed)
SLP Cancellation Note  Patient Details Name: Ashley Chan MRN: [id number] DOB: Jul 12, 1929   Cancelled treatment:       Reason Eval/Treat Not Completed: Patient at procedure or test/unavailable   Elvina Sidle, M.S., CCC-SLP 03/07/2017, 4:17 PM

## 2017-03-07 NOTE — PMR Pre-admission (Signed)
PMR Admission Coordinator Pre-Admission Assessment  Patient: Ashley Chan is an 83 y.o., female MRN: [id number] DOB: 06/21/1929 Height: 5\' 7"  (170.2 cm) Weight: 72.3 kg (159 lb 4.8 oz)              Insurance Information HMO:     PPO:      PCP:      IPA:      80/20:      OTHER:  PRIMARY: Medicare A & B      Policy#: [id number]      Subscriber: Self CM Name:       Phone#:      Fax#:  Pre-Cert#: eligible       Employer: [employer] Benefits:  Phone #: online      Name: Passport One Eff. Date: 02/27/96     Deduct: $1340      Out of Pocket Max: None      Life Max: None CIR: 100%      SNF: 100% days 1-20; 80% days 21-100 Outpatient: 80%     Co-Pay: 20% Home Health: 100%      Co-Pay: none DME: 80%     Co-Pay: 20% Providers: Patient's choice   SECONDARY: Ashley Chan       Policy#: [id number]      Subscriber: Self CM Name:       Phone#:      Fax#:  Pre-Cert#:       Employer:  Benefits:  Phone #: [phone]     Name:  Eff. Date:      Deduct:       Out of Pocket Max:       Life Max:  CIR:       SNF:  Outpatient:      Co-Pay:  Home Health:       Co-Pay:  DME:      Co-Pay:   TERTIARY: Generic Commercial      Policy#: [id number]      Subscriber:  CM Name:       Phone#:      Fax#:  Pre-Cert#:       Employer:  Benefits:  Phone #: [phone]     Name:  Eff. Date:      Deduct:       Out of Pocket Max:       Life Max:  CIR:       SNF:  Outpatient:      Co-Pay:  Home Health:       Co-Pay:  DME:      Co-Pay:   Medicaid Application Date:       Case Manager:  Disability Application Date:       Case Worker:   Emergency Contact Information Contact Information    Name Relation Home Work Abiquiu Daughter [phone]     Ashley Chan Daughter   [phone]     Current Medical History  Patient Admitting Diagnosis: Left cerebellar infarct with hemorrhagic conversion and midline shift.   History of Present Illness: Ashley G Carteris an 83 y.o.left handed  female who was admitted to OSH with reports of dizziness, malaise and coffec ground emesis. CT head unremarkable for acute process. Unable to get MRI brain due to the weekend. 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 She was transferred to [facility] for treatment and care. She was started medications for tighter BP control and required fluid boluses due to poor  UOP. Repeat CT head reviewed, stable midline shift. 2 D echo showed EF 60-655 with grade 2 diastolic dysfunction, moderate mitral valve calcification and moderate pulmonary HTN.  CTA head/neck showed severe left carotid bifurcation calcified plaque with severe 70% proximal L-ICA stenosis as well as thyroid nodules up to 15 mm.   She did have bradycardic episode question due to vasovagal event as well as reports of purple/red stools.  Stool guaiacs heme positive and patient developed coffee ground emesis. Ashley Chan consulted for input as patient with history of gastric ulcers. EGD  done yesterday and showed duodenitis but rectal exam with hard stool and dark blood on digital exam.  She underwent colonoscopy today revealing ten mm ulcer in sigmoid colon with erythema and edema--bx taken, internal hems, two polyps--resected and mild diverticulosis.  GI recommended maintaining adequate hydration, avoiding NSAIDs x 2 weeks and suspected that ischemic colitis led to ulcer and heme positive stools.   Therapy ongoing and patient with limitation in mobility and ability to carry out ADL tasks. CIR recommended by MD and rehab team for follow up therapy. Patient cleared for admission today.  NIH Total: 1    Past Medical History  Past Medical History:  Diagnosis Date  . Anxiety   . Ataxia, post-stroke   . Back pain   . Chronic midline low back pain without sciatica   . CVA (cerebral vascular accident) ([place]) 03/03/2017  . Hypertension   . Right hip pain      Family History  family history includes Lung disease in her father; Stroke (age of onset: 78) in her mother.  Prior Rehab/Hospitalizations:  Has the patient had major surgery during 100 days prior to admission? No  Current Medications   Current Facility-Administered Medications:  .  acetaminophen (TYLENOL) tablet 650 mg, 650 mg, Oral, Q6H PRN, Ashley Coots, PA-C, 650 mg at 03/08/17 2229 .  amLODipine (NORVASC) tablet 10 mg, 10 mg, Oral, Daily, Chan, Ashley A, NP, 10 mg at 03/09/17 1135 .  hydrochlorothiazide (HYDRODIURIL) tablet 25 mg, 25 mg, Oral, Daily, Chan, Ashley A, NP, 25 mg at 03/09/17 1132 .  labetalol (NORMODYNE) tablet 100 mg, 100 mg, Oral, BID, Chan, Ashley L, PA-C, 100 mg at 03/09/17 2302 .  ondansetron (ZOFRAN) injection 4 mg, 4 mg, Intravenous, Q4H PRN, Ashley Doom, MD, 4 mg at 03/06/17 0805 .  pantoprazole (PROTONIX) EC tablet 40 mg, 40 mg, Oral, Daily, Chan, Ashley L, PA-C, 40 mg at 03/09/17 1132 .  potassium chloride (KLOR-CON) packet 20 mEq, 20 mEq, Oral, BID, Chan, Early Chars, PA-C, 20 mEq at 03/09/17 2303 .  senna-docusate (Senokot-S) tablet 1 tablet, 1 tablet, Oral, QHS PRN, Ashley Coots, PA-C, 1 tablet at 03/05/17 2109 .  Vitamin D (Ergocalciferol) (DRISDOL) capsule 50,000 Units, 50,000 Units, Oral, Q7 days, Chan, Ashley Organ, NP, 50,000 Units at 03/08/17 8527  Patients Current Diet: DIET SOFT Room service appropriate? Yes; Fluid consistency: Thin  Precautions / Restrictions Precautions Precautions: Fall Precaution Comments: SBP <140 Restrictions Weight Bearing Restrictions: No   Has the patient had 2 or more falls or a fall with injury in the past year?No  Prior Activity Level Community (5-7x/wk): Prior to admission patient was fully indeopendent and out in the community daily.  She enjoys shopping, going to church, and being with her family.    Home Assistive Devices / Equipment Home Assistive Devices/Equipment: Cane  (specify quad or straight), Shower chair with back, CBG Meter Home Equipment: Shower seat, Environmental consultant - 2 wheels, [place] -  single point  Prior Device Use: Indicate devices/aids used by the patient prior to current illness, exacerbation or injury? Single point cane   Prior Functional Level Prior Function Level of Independence: Independent with assistive device(s) Comments: Pt uses SPC as needed. Drives.   Self Care: Did the patient need help bathing, dressing, using the toilet or eating? Independent  Indoor Mobility: Did the patient need assistance with walking from room to room (with or without device)? Independent  Stairs: Did the patient need assistance with internal or external stairs (with or without device)? Independent  Functional Cognition: Did the patient need help planning regular tasks such as shopping or remembering to take medications? Independent  Current Functional Level Cognition  Arousal/Alertness: Awake/alert Overall Cognitive Status: Impaired/Different from baseline Current Attention Level: Sustained Orientation Level: Oriented X4 Following Commands: Follows one step commands inconsistently, Follows one step commands with increased time General Comments: patient having difficulty sequencing basic tasks such as hygiene performance or following commands from therapy team. Some confusion with directional instruction and delays with processing  Attention: Sustained Sustained Attention: Impaired Sustained Attention Impairment: Verbal complex Memory: Impaired Memory Impairment: Storage deficit, Retrieval deficit, Decreased recall of new information Awareness: Impaired Awareness Impairment: Intellectual impairment, Emergent impairment, Anticipatory impairment Problem Solving: Impaired Problem Solving Impairment: Functional basic Safety/Judgment: Impaired    Extremity Assessment (includes Sensation/Coordination)  Upper Extremity Assessment: LUE deficits/detail LUE Deficits  / Details: Impaired coordination; ataxia present. Strength and ROM overall WFL. LUE Sensation: decreased proprioception LUE Coordination: decreased gross motor, decreased fine motor  Lower Extremity Assessment: Defer to PT evaluation LLE Deficits / Details: Ataxia present LLE. LLE Sensation: decreased proprioception LLE Coordination: decreased fine motor, decreased gross motor    ADLs  Overall ADL's : Needs assistance/impaired Eating/Feeding: Minimal assistance, Sitting Grooming: Moderate assistance, Standing, Wash/dry hands Grooming Details (indicate cue type and reason): mod assist for standing balance. cues for sequencing Upper Body Bathing: Moderate assistance, Sitting Lower Body Bathing: Maximal assistance, Sit to/from stand Upper Body Dressing : Moderate assistance, Sitting Lower Body Dressing: Maximal assistance, Sit to/from stand Toilet Transfer: Maximal assistance, +2 for physical assistance, Ambulation, Comfort height toilet, Grab bars Toileting- Clothing Manipulation and Hygiene: Maximal assistance, Sit to/from stand Functional mobility during ADLs: Maximal assistance, +2 for physical assistance    Mobility  Overal bed mobility: Needs Assistance Bed Mobility: Supine to Sit Supine to sit: HOB elevated, Min assist General bed mobility comments: Assist to elevate trunk to EOB. Truncal ataxia but mild right lateral lean noted    Transfers  Overall transfer level: Needs assistance Equipment used: Rolling walker (2 wheeled) Transfers: Sit to/from Stand, Stand Pivot Transfers Sit to Stand: +2 safety/equipment, Min assist Stand pivot transfers: +2 safety/equipment, Min assist General transfer comment: Assist to power to standing. Able to take a few steps to chair with Min A for balance, LLE ataxia with advancement. + dizziness and nausea.    Ambulation / Gait / Stairs / Wheelchair Mobility  Ambulation/Gait Ambulation/Gait assistance: Mod assist, Max assist, +2  safety/equipment Ambulation Distance (Feet): 20 Feet Assistive device: 2 person hand held assist, Rolling walker (2 wheeled), 1 person hand held assist Gait Pattern/deviations: Step-to pattern, Ataxic, Narrow base of support, Scissoring General Gait Details: Attempted RW however pt with increased difficulty sequencing. Increased LOB and unsafe pushing forward of walker. Moved to the railing in which pt had therapist on the R side, railing on L and a close chair follow. Pt was able to show improved sequencing and stronger steps.  Gait velocity: decreased  Gait velocity interpretation: Below normal speed for age/gender    Posture / Balance Dynamic Sitting Balance Sitting balance - Comments: Able to sit EOB with continued truncal ataxia. Balance Overall balance assessment: Needs assistance Sitting-balance support: Feet supported, No upper extremity supported Sitting balance-Leahy Scale: Fair Sitting balance - Comments: Able to sit EOB with continued truncal ataxia. Postural control: Right lateral lean Standing balance support: During functional activity, Bilateral upper extremity supported Standing balance-Leahy Scale: Poor Standing balance comment: Reliant on UEs and external support for balance due to ataxia.    Special needs/care consideration BiPAP/CPAP: No CPM: No Continuous Drip IV: No Dialysis: No         Life Vest: No Oxygen: No Special Bed: No Trach Size: No Wound Vac (area): No       Skin: WDL                               Bowel mgmt: Continent 03/07/17 Bladder mgmt: Continent, some urgency which patient used pads for prior to admission  Diabetic mgmt: No, HgbA1c 5.4     Previous Home Environment Living Arrangements: Children  Lives With: Daughter (2 daughters) Available Help at Discharge: Family Type of Home: House Home Layout: One level Home Access: Ramped entrance Bathroom Shower/Tub: Chiropodist: Handicapped height [place]:  No  Discharge Living Setting Plans for Discharge Living Setting: Patient's home, Lives with (comment) (2 daughters, 1 works and 1 is disabled with a walker ) Type of Home at Discharge: House Discharge Home Layout: One level Discharge Home Access: [facility] entrance Discharge Bathroom Shower/Tub: Tub/shower unit (has a shower seat ) Discharge Bathroom Toilet: Handicapped height Discharge Bathroom Accessibility: Yes How Accessible: Accessible via walker Does the patient have any problems obtaining your medications?: No  Social/Family/Support Systems Patient Roles: Parent Contact Information: Madilyn Fireman [phone] Anticipated Caregiver: Family  Anticipated Caregiver's Contact Information: see above  Ability/Limitations of Caregiver: Bonnita Nasuti can provide Supervision assist only; other daughter works and can provide PRN Min A Caregiver Availability: Other (Comment) (24/7 Supervision; Intermittent/evenings and weekends Min A) Discharge Plan Discussed with Primary Caregiver: Yes Is Caregiver In Agreement with Plan?: Yes Does Caregiver/Family have Issues with Lodging/Transportation while Pt is in Rehab?: No  Goals/Additional Needs Patient/Family Goal for Rehab: OT/PT Supervision-Min A; SLP Mod I  Expected length of stay: 14-18 days  Cultural Considerations: None Dietary Needs: Heart Healthy Restrictions  Equipment Needs: TBD Special Service Needs: None Additional Information: N/A Pt/Family Agrees to Admission and willing to participate: Yes Program Orientation Provided & Reviewed with Pt/Caregiver Including Roles  & Responsibilities: Yes Additional Information Needs: N/A Information Needs to be Provided By: N/A  Barriers to Discharge: Other (comments) (Potential decreased caregiver support based on pt. progress)  Decrease burden of Care through IP rehab admission: No  Possible need for SNF placement upon discharge: Potential if vertigo and ataxia are prolonged and impact ability to  achieve Supervision-Min A goals   Patient Condition: This patient's medical and functional status has changed since the consult dated: 03/05/17 in which the Rehabilitation Physician determined and documented that the patient's condition is appropriate for intensive rehabilitative care in an inpatient rehabilitation facility. See "History of Present Illness" (above) for medical update. Functional changes are: Mod A +2 for transfers, Mod-Max A +2 for gait. Patient's medical and functional status update has been discussed with the Rehabilitation physician and patient remains appropriate for inpatient rehabilitation. Will admit to inpatient rehab today.  Preadmission Screen Completed By:  Gunnar Fusi, 03/10/2017 1:46 PM ______________________________________________________________________   Discussed status with Dr. Posey Pronto on 03/10/17 at 1455 and received telephone approval for admission today.  Admission Coordinator:  Gunnar Fusi, time 1455/Date 03/10/17

## 2017-03-07 NOTE — Progress Notes (Signed)
Physical Therapy Treatment Patient Details Name: Ashley Chan MRN: [id number] DOB: May 26, 1929 Today's Date: 03/07/2017    History of Present Illness Patient is a 83 y/o female who presents with dizziness, N/V and some coffee-ground emesis. 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 PMH includes HTN.     PT Comments    Pt progressing towards physical therapy goals. Was able to perform transfers and ambulation with +2 assist for balance support and safety. Showed improved coordination with railing and HHA from therapist vs RW. Pt anticipates d/c to CIR tomorrow. Will continue to follow and progress as able per POC.    Follow Up Recommendations  CIR;Supervision for mobility/OOB     Equipment Recommendations  Other (comment) (defer to next venue)    Recommendations for Other Services OT consult     Precautions / Restrictions Precautions Precautions: Fall Precaution Comments: SBP <140 Restrictions Weight Bearing Restrictions: No    Mobility  Bed Mobility Overal bed mobility: Needs Assistance Bed Mobility: Supine to Sit     Supine to sit: HOB elevated;Min assist     General bed mobility comments: Assist to elevate trunk to EOB. Truncal ataxia but mild right lateral lean noted  Transfers Overall transfer level: Needs assistance Equipment used: Rolling walker (2 wheeled) Transfers: Sit to/from [facility] Sit to Stand: +2 safety/equipment;Min assist Stand pivot transfers: +2 safety/equipment;Min assist       General transfer comment: Assist to power to standing. Able to take a few steps to chair with Min A for balance, LLE ataxia with advancement. + dizziness and nausea.  Ambulation/Gait Ambulation/Gait assistance: Mod assist;Max assist;+2 safety/equipment Ambulation Distance (Feet): 20 Feet Assistive device: 2 person hand held assist;Rolling walker (2  wheeled);1 person hand held assist Gait Pattern/deviations: Step-to pattern;Ataxic;Narrow base of support;Scissoring Gait velocity: decreased Gait velocity interpretation: Below normal speed for age/gender General Gait Details: Attempted RW however pt with increased difficulty sequencing. Increased LOB and unsafe pushing forward of walker. Moved to the railing in which pt had therapist on the R side, railing on L and a close chair follow. Pt was able to show improved sequencing and stronger steps.    Stairs            Wheelchair Mobility    Modified Rankin (Stroke Patients Only) Modified Rankin (Stroke Patients Only) Pre-Morbid Rankin Score: No symptoms Modified Rankin: Moderately severe disability     Balance Overall balance assessment: Needs assistance Sitting-balance support: Feet supported;No upper extremity supported Sitting balance-Leahy Scale: Fair Sitting balance - Comments: Able to sit EOB with continued truncal ataxia. Postural control: Right lateral lean Standing balance support: During functional activity;Bilateral upper extremity supported Standing balance-Leahy Scale: Poor Standing balance comment: Reliant on UEs and external support for balance due to ataxia.                            Cognition Arousal/Alertness: Awake/alert Behavior During Therapy: WFL for tasks assessed/performed Overall Cognitive Status: Impaired/Different from baseline Area of Impairment: Attention;Following commands;Awareness;Problem solving                   Current Attention Level: Sustained   Following Commands: Follows one step commands inconsistently;Follows one step commands with increased time   Awareness: Emergent Problem Solving: Slow processing;Difficulty sequencing;Requires verbal cues;Requires tactile cues        Exercises      General Comments General comments (skin  integrity, edema, etc.): BP 160/65 at rest, 177/72 after ambulation       Pertinent Vitals/Pain Pain Assessment: Faces Faces Pain Scale: Hurts a little bit Pain Location: headache/neck pain Pain Descriptors / Indicators: Headache Pain Intervention(s): Limited activity within patient's tolerance;Monitored during session;Repositioned    Home Living                      Prior Function            PT Goals (current goals can now be found in the care plan section) Acute Rehab PT Goals Patient Stated Goal: to return to independence PT Goal Formulation: With patient Time For Goal Achievement: 03/18/17 Potential to Achieve Goals: Good Progress towards PT goals: Progressing toward goals    Frequency    Min 4X/week      PT Plan Current plan remains appropriate    Co-evaluation              AM-PAC PT "6 Clicks" Daily Activity  Outcome Measure  Difficulty turning over in bed (including adjusting bedclothes, sheets and blankets)?: Total Difficulty moving from lying on back to sitting on the side of the bed? : Total Difficulty sitting down on and standing up from a chair with arms (e.g., wheelchair, bedside commode, etc,.)?: Total Help needed moving to and from a bed to chair (including a wheelchair)?: A Lot Help needed walking in hospital room?: A Lot Help needed climbing 3-5 steps with a railing? : Total 6 Click Score: 8    End of Session Equipment Utilized During Treatment: Gait belt Activity Tolerance: Patient tolerated treatment well;Other (comment) (nausea) Patient left: in chair;with call bell/phone within reach;with chair alarm set;with family/visitor present Nurse Communication: Mobility status PT Visit Diagnosis: Difficulty in walking, not elsewhere classified (R26.2);Unsteadiness on feet (R26.81);Ataxic gait (R26.0);Dizziness and giddiness (R42)     Time: 0737-1062 PT Time Calculation (min) (ACUTE ONLY): 36 min  Charges:  $Gait Training: 23-37 mins                    G Codes:       Rolinda Roan, PT, DPT Acute  Rehabilitation Services Pager: [place] 03/07/2017, 3:21 PM

## 2017-03-07 NOTE — Progress Notes (Signed)
Patient had small amount of purple red mucous stool this am x 1.MD on call made aware,new orders given and noted.Labs drawn , pt in no acute distress, no change in neuro status, MAE well.RN will continue to monitor.

## 2017-03-07 NOTE — Progress Notes (Signed)
Dr. Leonel Ramsay aware of Hemoccult test positive. CBC ordered for tomorrow AM 8/11; morning MD to make decision on Lovenox after lab is resulted. Also aware of Potassium level of 3.1 this AM. Order received for potassium replacement. Info passed to receiving [facility].

## 2017-03-07 NOTE — Progress Notes (Signed)
Inpatient Rehabilitation  Met with patient, son, and daughter to further discuss IP Rehab.  They are in favor of IP Rehab admission when medically ready.  Note that patient received IV BP medications this morning.  Will set up IP Rehab admission for Saturday, 03/08/17 pending medical clearance.  Please plan for Dr. Posey Pronto to follow up tomorrow and clear patient for admission.  Call Rehab nurse's station with questions, [phone].    Carmelia Roller., CCC/SLP Admission Coordinator  [place]  Cell [phone]

## 2017-03-07 NOTE — Progress Notes (Signed)
STROKE TEAM PROGRESS NOTE   SUBJECTIVE (INTERVAL HISTORY) Her daughter and granddaughter are at the bedside.  The patient is awake, alert, and follows commands appropriately.  SBP occasionally > 180.  Increased amlodipine dose from 5mg  to 10mg  daily today.  Pt awaiting CIR bed.     OBJECTIVE Temp:  [98 F (36.7 C)-99.3 F (37.4 C)] 98.2 F (36.8 C) (08/10 0400) Pulse Rate:  [63-87] 81 (08/10 0700) Cardiac Rhythm: Normal sinus rhythm (08/10 0600) Resp:  [8-22] 11 (08/10 0700) BP: (91-190)/(51-132) 174/74 (08/10 0700) SpO2:  [96 %-99 %] 98 % (08/10 0700)  CBC:   Recent Labs Lab 03/04/17 0329 03/07/17 0514  WBC 9.0 6.5  NEUTROABS  --  3.5  HGB 13.1 12.2  HCT 37.0 34.9*  MCV 94.9 92.1  PLT 259 578    Basic Metabolic Panel:   Recent Labs Lab 03/04/17 0329 03/07/17 0514  NA 136 139  K 4.5 3.1*  CL 104 102  CO2 19* 27  GLUCOSE 117* 110*  BUN 14 11  CREATININE 0.85 0.86  CALCIUM 8.3* 8.8*    Lipid Panel:     Component Value Date/Time   CHOL 216 (H) 03/04/2017 0329   TRIG 969 (H) 03/04/2017 0329   HDL 26 (L) 03/04/2017 0329   CHOLHDL 8.3 03/04/2017 0329   VLDL UNABLE TO CALCULATE IF TRIGLYCERIDE OVER 400 mg/dL 03/04/2017 0329   LDLCALC UNABLE TO CALCULATE IF TRIGLYCERIDE OVER 400 mg/dL 03/04/2017 0329   HgbA1c:  Lab Results  Component Value Date   HGBA1C 5.4 03/04/2017   Urine Drug Screen: No results found for: LABOPIA, COCAINSCRNUR, LABBENZ, AMPHETMU, THCU, LABBARB  Alcohol Level No results found for: ETH  IMAGING  Ct Angio Head W Or Wo Contrast Ct Angio Neck W Or Wo Contrast 03/03/2017  IMPRESSION: CT head: 1. Left superior cerebellar hemorrhage, associated vasogenic edema, and mass effect are stable in comparison with prior MRI given differences in technique. 2. Stable mild superior transtentorial herniation, partial effacement of quadrigeminal plate cistern, and of fourth ventricle. No hydrocephalus. 3. No new acute intracranial abnormality. CTA neck: 1.  Patent carotid and vertebral arteries of the neck. 2. Moderate right carotid bifurcation calcified plaque with mild 30% proximal ICA stenosis. 3. Severe left carotid bifurcation calcified plaque with severe 70% proximal ICA stenosis. 4. 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 5. Thyroid nodules measuring up to 15 mm. Further evaluation with thyroid ultrasound recommended. CTA head: 1. Patent circle of Willis. No large vessel occlusion, aneurysm, or vascular malformation identified. 2. Calcific plaque of the carotid siphons with mild right and moderate to severe left paraclinoid stenosis. 3. No appreciable left AICA, possibly diminutive or occluded. These results will be called to the ordering clinician or representative by the Radiologist Assistant, and communication documented in the PACS or zVision Dashboard.  Ct Head Wo Contrast 03/04/2017 IMPRESSION: 1. Slight interval increase in size of left superior cerebellar hematoma as above with slightly worsened associated vasogenic edema. Associated mass effect on the adjacent fourth ventricle slightly worsened, although remains patent at this time. No hydrocephalus. 2. No other new acute intracranial process.   Dg Chest Port 1 View 03/03/2017 IMPRESSION: Improved aeration of the left lower lung. Atelectasis remains at both lung bases.  TTE 03/04/2017  Left ventricle: The cavity size was normal. There was moderate   concentric hypertrophy. Systolic function was normal. The   estimated ejection fraction was in the range of  60% to 65%. Wall   motion was normal; there were no regional wall motion   abnormalities  Carotid US 03/04/2017 The vertebral arteries appear patent with antegrade flow. - Findings consistent with a 1-39 percent stenosis involving the   right internal carotid artery. Right external artery demonstrates   elevated velocicites. -  Findings consistent with a 1-39% stenosis by diastolic velocity,   peak systolic internal carotid artery velocities fall within the   60 - 79% stenosis range   PHYSICAL EXAM Pleasant elderly African-American lady currently not in distress.  Afebrile. Head is nontraumatic. Neck is supple without bruit.    Cardiac exam no murmur or gallop. Lungs are clear to auscultation. Distal pulses are well felt. Neurological Exam : Awake alert oriented 3. Diminished attention and recall. Follows commands well. Extraocular movements are full range but there is saccadic dysmetria on left gaze. Vision acuity and fields seem adequate. Face is symmetric without weakness. Tongue is midline. Mild weakness of left grip and intrinsic hand muscles. Fine finger movements are diminished on the left compared to the right. No upper or lower extremity drift noted. Mild left finger-to-nose and knee to heel dysmetria. Deep tendon flow is symmetric. Plantars downgoing. Gait not tested. ASSESSMENT/PLAN Ashley Chan is a 83 y.o. female with history of hypertension presenting with dizziness, nausea, and coffee ground emesis.  She did not receive IV t-PA due to [place].   Stroke: Left superior cerebellar hematoma with increased vasogenic edema and mass effect on adjacent fourth ventricle without hydrocephalus.  Resultant  mild left hemi-ataxia  CT head: Slightly increased left superior cerebellar hematoma with slightly increased vasogenic edema.  Mass effect on adjacent fourth ventricle without hydrocephalus.    MRI head:  Done at [facility] reviewed  MRA head: not ordered  Carotid Doppler: EF 60-65%. No source of embolus  2D Echo: B ICA 1-39% stenosis, VAs antegrade  LDL > 400  HgbA1c 5.4  SCDs for VTE prophylaxis Diet Heart Room service appropriate? Yes; Fluid consistency: Thin  No antithrombotic prior to admission, now on No antithrombotic due to hemorrhage  Patient counseled to be compliant with her  antithrombotic medications  Ongoing aggressive stroke risk factor management  Therapy recommendations: CIR  Disposition: Discharge and readmit to inpatient rehab  Hypertension  Unstable  Clevidipine gtt off since 03/06/2017  Goal SBP < 180 mmHg  Amlodipine 10mg  PO daily   HCTZ 25mg  PO daily  Long-term BP goal normotensive  Hyperlipidemia  Home meds: none  LDL > 400, goal < 70  Statin held due to [place]  Start statin prior to discharge  Other Stroke Risk Factors  Advanced age  Other Active Problems  None  Hospital day # 4  I have personally examined this patient, reviewed notes, independently viewed imaging studies, participated in medical decision making and plan of care.ROS completed by me personally and pertinent positives fully documented  I have made any additions or clarifications directly to the above note.   Plan mobilize out of bed. BP is yet slightly suboptimally controlled. Increase Norvasc to 10 mg daily.transfer to neuro floor bed.. . Medical history stable to transfer to rehabilitation when bed available. Discussed with rehabilitation coordinator. Long discussion of the bedside with the patient and family and answered questions. Greater than 50% time during this 35 minute visit was spent on counseling and coordination of care about her hemorrhagic stroke, blood pressure control and discussion about rehabilitation needs and answered questions       Ashley Chan  Ashley Chan, [place] Pager: [phone] 03/07/2017 8:05 AM   To contact Stroke Continuity provider, please refer to [email or web address] After hours, contact General Neurology

## 2017-03-08 ENCOUNTER — Encounter (HOSPITAL_COMMUNITY): Payer: Self-pay | Admitting: Internal Medicine

## 2017-03-08 ENCOUNTER — Inpatient Hospital Stay (HOSPITAL_COMMUNITY): Payer: Medicare Other

## 2017-03-08 LAB — BASIC METABOLIC PANEL
ANION GAP: 8 (ref 5–15)
BUN: 10 mg/dL (ref 6–20)
CHLORIDE: 98 mmol/L — AB (ref 101–111)
CO2: 32 mmol/L (ref 22–32)
Calcium: 9 mg/dL (ref 8.9–10.3)
Creatinine, Ser: 0.83 mg/dL (ref 0.44–1.00)
GFR calc Af Amer: >60 mL/min (ref >60)
GFR calc non Af Amer: >60 mL/min (ref >60)
GLUCOSE: 155 mg/dL — AB (ref 65–99)
POTASSIUM: 3 mmol/L — AB (ref 3.5–5.1)
Sodium: 138 mmol/L (ref 135–145)

## 2017-03-08 LAB — MAGNESIUM: Magnesium: 2.1 mg/dL (ref 1.7–2.4)

## 2017-03-08 LAB — PHOSPHORUS: Phosphorus: 3.9 mg/dL (ref 2.5–4.6)

## 2017-03-08 LAB — CBC
HCT: 35.1 % — ABNORMAL LOW (ref 36.0–46.0)
Hemoglobin: 12 g/dL (ref 12.0–15.0)
MCH: 31.4 pg (ref 26.0–34.0)
MCHC: 34.2 g/dL (ref 30.0–36.0)
MCV: 91.9 fL (ref 78.0–100.0)
Platelets: 269 10*3/uL (ref 150–400)
RBC: 3.82 MIL/uL — ABNORMAL LOW (ref 3.87–5.11)
RDW: 13.4 % (ref 11.5–15.5)
WBC: 7 10*3/uL (ref 4.0–10.5)

## 2017-03-08 MED ORDER — LABETALOL HCL 100 MG PO TABS
100.0000 mg | ORAL_TABLET | Freq: Two times a day (BID) | ORAL | Status: DC
  Administered 2017-03-08 – 2017-03-09 (×4): 100 mg via ORAL
  Filled 2017-03-08 (×4): qty 1

## 2017-03-08 MED ORDER — POTASSIUM CHLORIDE 20 MEQ PO PACK
20.0000 meq | PACK | Freq: Two times a day (BID) | ORAL | Status: DC
  Administered 2017-03-08 – 2017-03-09 (×4): 20 meq via ORAL
  Filled 2017-03-08 (×6): qty 1

## 2017-03-08 MED ORDER — PANTOPRAZOLE SODIUM 40 MG PO TBEC
40.0000 mg | DELAYED_RELEASE_TABLET | Freq: Every day | ORAL | Status: DC
  Administered 2017-03-08 – 2017-03-09 (×2): 40 mg via ORAL
  Filled 2017-03-08 (×2): qty 1

## 2017-03-08 NOTE — Progress Notes (Signed)
STROKE TEAM PROGRESS NOTE    History per H&P Dr. Leonel Ramsay 03/03/2017 Ashley Chan is an 83 y.o. female with history of hypertension who presented to [facility] emergency room complaining of not feeling well. Apparently patient woke up in the morning not feeling well and then started noticing dizziness followed by nausea vomiting and some coffee-ground emesis. Initial head CT was negative. Unfortunately they were unable to get MRI secondary to no MRIs being done at [facility] over the weekend. 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 For this reason patient was transferred to [facility] for further evaluation and care.  Date last known well: Date: 03/02/2017 Time last known well: Unable to determine tPA Given: No: Hemorrhagic transformation, no last known normal   Modified Rankin: Rankin Score=0   SUBJECTIVE (INTERVAL HISTORY) She feels she is moving in her bed better.  Grandson in room.  Nursing has noted Heme + stools (noted h/o coffee-ground emesis on admission).  No other new neurological complaints.  ROS Review of Systems  Neurological: Positive for weakness.    OBJECTIVE Temp:  [97.9 F (36.6 C)-99.8 F (37.7 C)] 97.9 F (36.6 C) (08/11 1419) Pulse Rate:  [73-96] 75 (08/11 1419) Cardiac Rhythm: Normal sinus rhythm (08/11 0728) Resp:  [18-19] 19 (08/11 1419) BP: (145-182)/(63-99) 145/71 (08/11 1419) SpO2:  [94 %-99 %] 94 % (08/11 1419)   MEDICATIONS   . amLODipine  10 mg Oral Daily  . hydrochlorothiazide  25 mg Oral Daily  . labetalol  100 mg Oral BID  . pantoprazole  40 mg Oral Daily  . potassium chloride  20 mEq Oral BID  . Vitamin D (Ergocalciferol)  50,000 Units Oral Q7 days   acetaminophen, ondansetron (ZOFRAN) IV, senna-docusate   CBC:   Recent Labs Lab 03/07/17 0514 03/08/17 0443  WBC 6.5 7.0   NEUTROABS 3.5  --   HGB 12.2 12.0  HCT 34.9* 35.1*  MCV 92.1 91.9  PLT 240 086    Basic Metabolic Panel:   Recent Labs Lab 03/07/17 0514 03/08/17 0957  NA 139 138  K 3.1* 3.0*  CL 102 98*  CO2 27 32  GLUCOSE 110* 155*  BUN 11 10  CREATININE 0.86 0.83  CALCIUM 8.8* 9.0  MG  --  2.1  PHOS  --  3.9    Lipid Panel:     Component Value Date/Time   CHOL 216 (H) 03/04/2017 0329   TRIG 969 (H) 03/04/2017 0329   HDL 26 (L) 03/04/2017 0329   CHOLHDL 8.3 03/04/2017 0329   VLDL UNABLE TO CALCULATE IF TRIGLYCERIDE OVER 400 mg/dL 03/04/2017 0329   LDLCALC UNABLE TO CALCULATE IF TRIGLYCERIDE OVER 400 mg/dL 03/04/2017 0329   HgbA1c:  Lab Results  Component Value Date   HGBA1C 5.4 03/04/2017   Urine Drug Screen: No results found for: LABOPIA, COCAINSCRNUR, LABBENZ, AMPHETMU, THCU, LABBARB  Alcohol Level No results found for: ETH  IMAGING  Ct Angio Head W Or Wo Contrast Ct Angio Neck W Or Wo Contrast 03/03/2017   CT head:  1. Left superior cerebellar hemorrhage, associated vasogenic edema, and mass effect are stable in comparison with prior MRI given differences in technique.  2. Stable mild superior transtentorial herniation, partial effacement of quadrigeminal plate cistern, and of fourth ventricle. No hydrocephalus.  3. No new acute intracranial abnormality.     CTA neck:  1. Patent carotid and  vertebral arteries of the neck.  2. Moderate right carotid bifurcation calcified plaque with mild 30% proximal ICA stenosis.  3. Severe left carotid bifurcation calcified plaque with severe 70% proximal ICA stenosis.  4. 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  5. Thyroid nodules measuring up to 15 mm. Further evaluation with thyroid ultrasound recommended.   CTA head:  1. Patent circle of Willis. No large vessel occlusion, aneurysm, or vascular malformation identified.   2. Calcific plaque of the carotid siphons with mild right and moderate to severe left paraclinoid stenosis.  3. No appreciable left AICA, possibly diminutive or occluded.   Ct Head Wo Contrast 03/04/2017 1. Slight interval increase in size of left superior cerebellar hematoma as above with slightly worsened associated vasogenic edema. Associated mass effect on the adjacent fourth ventricle slightly worsened, although remains patent at this time. No hydrocephalus.  2. No other new acute intracranial process.   Dg Chest Port 1 View 03/03/2017 Improved aeration of the left lower lung. Atelectasis remains at both lung bases.  TTE 03/04/2017  Left ventricle: The cavity size was normal. There was moderate   concentric hypertrophy. Systolic function was normal. The   estimated ejection fraction was in the range of 60% to 65%. Wall   motion was normal; there were no regional wall motion   abnormalities  Carotid US 03/04/2017 The vertebral arteries appear patent with antegrade flow. - Findings consistent with a 1-39 percent stenosis involving the   right internal carotid artery. Right external artery demonstrates   elevated velocicites. - Findings consistent with a 1-39% stenosis by diastolic velocity,   peak systolic internal carotid artery velocities fall within the   60 - 79% stenosis range   PHYSICAL EXAM Pleasant elderly African-American lady currently not in distress.  Afebrile. Head is nontraumatic. Neck is supple without bruit.    Cardiac exam no murmur or gallop. Lungs are clear to auscultation. Distal pulses are well felt.  Neurological Exam : Awake alert oriented 3. Diminished attention and recall. Follows commands well. Extraocular movements are full range but there is saccadic dysmetria on left gaze. Vision acuity and fields seem adequate. Face is symmetric without weakness. Tongue is midline. Mild weakness of left grip and intrinsic hand muscles. Fine finger movements are diminished  on the left compared to the right. No upper or lower extremity drift noted. Mild left finger-to-nose and knee to heel dysmetria. Deep tendon flow is symmetric. Plantars downgoing. Gait not tested.   ASSESSMENT/PLAN Ashley Chan is a 83 y.o. female with history of hypertension presenting with dizziness, nausea, and coffee ground emesis.  She did not receive IV t-PA due to [place].   Stroke: Left superior cerebellar hematoma with increased vasogenic edema and mass effect on adjacent fourth ventricle without hydrocephalus.  Resultant  mild left hemi-ataxia  CT head: Slightly increased left superior cerebellar hematoma with slightly increased vasogenic edema.  Mass effect on adjacent fourth ventricle without hydrocephalus.    CTA Head/Neck: 70% proximal Lt ICA stenosis/ 30% proximal Rt ICA stenosis, Severe cervical stenosis (C5/6), Thyroid nodule         Patent circle of willis (no LVO), mod-sever Lt paraclinoid stenosis, no appreciable Lt AICA  MRI head:  Done at [facility] hospital reviewed  MRA head: not ordered  Carotid Doppler: EF 60-65%. No source of embolus  2D Echo: B ICA 1-39% stenosis, VAs antegrade  LDL > 400  HgbA1c 5.4  SCDs for VTE prophylaxis Diet Heart Room service appropriate? Yes; Fluid consistency: Thin  No antithrombotic prior to admission, now on No antithrombotic due to hemorrhage  Patient counseled to be compliant with her antithrombotic medications  Ongoing aggressive stroke risk factor management  Therapy recommendations: CIR  Disposition: Discharge and readmit to inpatient rehab  Hypertension  Unstable (SBP's 150's- 180's)- Labetolol 100 mg bid added  Clevidipine gtt off since 03/06/2017  Goal SBP < 180 mmHg  Amlodipine 10mg  PO daily   HCTZ 25mg  PO daily  Long-term BP goal normotensive  Hyperlipidemia  Home meds: none  LDL > 400, goal < 70  Statin held due to [place]  Start statin prior to discharge  Other Stroke Risk Factors  Advanced  age  Other Active Problems  Nurse reports heme positive stool - coffee grounds emesis - DC Lovenox - SCDs  - Gi Consult - Monitor H&H  Rehab discharge on Hold until GI consult and address Heme + stools  Hypokalemia  Thyroid nodules measuring up to 15 mm. Further evaluation with thyroid ultrasound recommended.   Rehab admission on hold.  Severe cervical stenosis by CTA neck- to obtain C-spine MRI  Items Pending:  GI consult: Heme + stools (h/o coffee ground emesis)  Thyroid U/S (thyroid nodules)  C-spine MRI for severe cervical stenosis seen on CTA  Labs (CBC, C7, Mg, PO4 in AM)    Hospital day # 5  I have personally examined this patient, reviewed notes, independently viewed imaging studies, participated in medical decision making and plan of care.ROS completed by me personally and pertinent positives fully documented  I have made any additions or clarifications directly to the above note.    She has remained neurologically stable, but has now had some Heme + stools that will need to be addressed before discharge to Rehab.  Have added Labetolol for persistently elevated SBP's, will obtain C-spine MRI for ? Cervical stenosis by CTA, and Thyroid U/S.  Greater than 50% time during this 30 minute visit was spent on counseling and coordination of care of her hemorrhagic stroke, blood pressure control and evaluation of Heme + stools.    To contact Stroke Continuity provider, please refer to [email or web address] After hours, contact General Neurology

## 2017-03-08 NOTE — Progress Notes (Signed)
Brief Note:  Spoke with nursing regarding Heme+ stool with also reports of coffee ground emesis with ?plans for anticoagulation.  Will hold rehab admission and reevaluate.    Delice Lesch, MD, [facility]

## 2017-03-08 NOTE — Progress Notes (Signed)
    Have made patient NPO and will evaluate in AAM - has coffee ground emesis and heme _ stool. May need EGD  [name], MD, [facility] [phone] (pager) 03/08/2017 8:32 PM

## 2017-03-09 ENCOUNTER — Encounter (HOSPITAL_COMMUNITY)
Admission: AD | Disposition: A | Discharge status: Rehabilitation Facility | Payer: Self-pay | Source: Other Acute Inpatient Hospital | Attending: Neurology

## 2017-03-09 ENCOUNTER — Encounter (HOSPITAL_COMMUNITY): Payer: Self-pay | Admitting: Internal Medicine

## 2017-03-09 ENCOUNTER — Inpatient Hospital Stay (HOSPITAL_COMMUNITY): Payer: No Typology Code available for payment source

## 2017-03-09 ENCOUNTER — Inpatient Hospital Stay (HOSPITAL_COMMUNITY): Payer: Medicare Other

## 2017-03-09 DIAGNOSIS — D649 Anemia, unspecified: Secondary | ICD-10-CM

## 2017-03-09 DIAGNOSIS — K920 Hematemesis: Secondary | ICD-10-CM

## 2017-03-09 DIAGNOSIS — K2980 Duodenitis without bleeding: Secondary | ICD-10-CM

## 2017-03-09 DIAGNOSIS — R195 Other fecal abnormalities: Secondary | ICD-10-CM

## 2017-03-09 HISTORY — PX: ESOPHAGOGASTRODUODENOSCOPY: SHX5428

## 2017-03-09 LAB — BASIC METABOLIC PANEL
ANION GAP: 11 (ref 5–15)
BUN: 16 mg/dL (ref 6–20)
CALCIUM: 9.3 mg/dL (ref 8.9–10.3)
CO2: 27 mmol/L (ref 22–32)
Chloride: 100 mmol/L — ABNORMAL LOW (ref 101–111)
Creatinine, Ser: 0.89 mg/dL (ref 0.44–1.00)
GFR, EST NON AFRICAN AMERICAN: 57 mL/min — AB (ref >60)
Glucose, Bld: 137 mg/dL — ABNORMAL HIGH (ref 65–99)
POTASSIUM: 3.9 mmol/L (ref 3.5–5.1)
Sodium: 138 mmol/L (ref 135–145)

## 2017-03-09 LAB — CBC
HCT: 35.1 % — ABNORMAL LOW (ref 36.0–46.0)
Hemoglobin: 11.9 g/dL — ABNORMAL LOW (ref 12.0–15.0)
MCH: 31.2 pg (ref 26.0–34.0)
MCHC: 33.9 g/dL (ref 30.0–36.0)
MCV: 92.1 fL (ref 78.0–100.0)
PLATELETS: 265 10*3/uL (ref 150–400)
RBC: 3.81 MIL/uL — AB (ref 3.87–5.11)
RDW: 13.2 % (ref 11.5–15.5)
WBC: 8.7 10*3/uL (ref 4.0–10.5)

## 2017-03-09 LAB — MAGNESIUM: Magnesium: 2 mg/dL (ref 1.7–2.4)

## 2017-03-09 LAB — PHOSPHORUS: PHOSPHORUS: 4.6 mg/dL (ref 2.5–4.6)

## 2017-03-09 SURGERY — EGD (ESOPHAGOGASTRODUODENOSCOPY)
Anesthesia: Moderate Sedation

## 2017-03-09 MED ORDER — FENTANYL CITRATE (PF) 100 MCG/2ML IJ SOLN
INTRAMUSCULAR | Status: DC | PRN
  Administered 2017-03-09 (×2): 12.5 ug via INTRAVENOUS

## 2017-03-09 MED ORDER — BISACODYL 5 MG PO TBEC
20.0000 mg | DELAYED_RELEASE_TABLET | Freq: Once | ORAL | Status: AC
  Administered 2017-03-09: 20 mg via ORAL
  Filled 2017-03-09: qty 4

## 2017-03-09 MED ORDER — SODIUM CHLORIDE 0.9 % IV SOLN
INTRAVENOUS | Status: DC
  Administered 2017-03-09: 09:00:00 via INTRAVENOUS

## 2017-03-09 MED ORDER — METOCLOPRAMIDE HCL 5 MG/ML IJ SOLN
5.0000 mg | Freq: Once | INTRAMUSCULAR | Status: AC
  Administered 2017-03-09: 5 mg via INTRAVENOUS
  Filled 2017-03-09: qty 2

## 2017-03-09 MED ORDER — LACTATED RINGERS IV SOLN
INTRAVENOUS | Status: DC
  Administered 2017-03-10: 11:00:00 via INTRAVENOUS

## 2017-03-09 MED ORDER — METOCLOPRAMIDE HCL 5 MG/ML IJ SOLN
5.0000 mg | Freq: Once | INTRAMUSCULAR | Status: AC
Start: 2017-03-09 — End: 2017-03-09
  Administered 2017-03-09: 5 mg via INTRAVENOUS
  Filled 2017-03-09: qty 2

## 2017-03-09 MED ORDER — PEG-KCL-NACL-NASULF-NA ASC-C 100 G PO SOLR
0.5000 | Freq: Once | ORAL | Status: AC
  Administered 2017-03-09: 100 g via ORAL
  Filled 2017-03-09: qty 1

## 2017-03-09 MED ORDER — MIDAZOLAM HCL 10 MG/2ML IJ SOLN
INTRAMUSCULAR | Status: DC | PRN
  Administered 2017-03-09 (×3): 1 mg via INTRAVENOUS

## 2017-03-09 MED ORDER — MIDAZOLAM HCL 5 MG/ML IJ SOLN
INTRAMUSCULAR | Status: AC
  Filled 2017-03-09: qty 2

## 2017-03-09 MED ORDER — PEG-KCL-NACL-NASULF-NA ASC-C 100 G PO SOLR: 0.5000 | Freq: Once | ORAL | Status: DC

## 2017-03-09 MED ORDER — FENTANYL CITRATE (PF) 100 MCG/2ML IJ SOLN
INTRAMUSCULAR | Status: AC
  Filled 2017-03-09: qty 2

## 2017-03-09 NOTE — Progress Notes (Signed)
Patient left by bed to Endoscopy.

## 2017-03-09 NOTE — Op Note (Signed)
[facility] Patient Name: Ashley Chan Procedure Date : 03/09/2017 MRN: [id number] Attending MD: [name] , MD Date of Birth: 01/04/1929 CSN: [id number] Age: 83 Admit Type: Inpatient Procedure:                Upper GI endoscopy Indications:              Coffee-ground emesis, Heme positive stool Providers:                [name], MD, Vista Lawman, RN, Corliss Parish, Technician Referring MD:              Medicines:                Midazolam 3 mg IV, Fentanyl 25 micrograms IV Complications:            No immediate complications. Estimated Blood Loss:     Estimated blood loss: none. Procedure:                Pre-Anesthesia Assessment:                           - Prior to the procedure, a History and Physical                            was performed, and patient medications and                            allergies were reviewed. The patient's tolerance of                            previous anesthesia was also reviewed. The risks                            and benefits of the procedure and the sedation                            options and risks were discussed with the patient.                            All questions were answered, and informed consent                            was obtained. Prior Anticoagulants: The patient                            last took Lovenox (enoxaparin) 1 day prior to the                            procedure. ASA Grade Assessment: III - A patient                            with severe systemic disease. After reviewing the  risks and benefits, the patient was deemed in                            satisfactory condition to undergo the procedure.                           After obtaining informed consent, the endoscope was                            passed under direct vision. Throughout the                            procedure, the patient's blood pressure, pulse, and                    oxygen saturations were monitored continuously. The                            EG-[id number] ([id number]) scope was introduced through the                            mouth, and advanced to the second part of duodenum.                            The upper GI endoscopy was accomplished without                            difficulty. The patient tolerated the procedure                            well. Scope In: Scope Out: Findings:      Patchy mild inflammation characterized by erythema and granularity was       found in the duodenal bulb.      The exam was otherwise without abnormality.      The cardia and gastric fundus were normal on retroflexion. Impression:               - Duodenitis.                           - The examination was otherwise normal.                           - No specimens collected. Moderate Sedation:      Moderate (conscious) sedation was administered by the endoscopy nurse       and supervised by the endoscopist. The following parameters were       monitored: oxygen saturation, heart rate, blood pressure, respiratory       rate, EKG, adequacy of pulmonary ventilation, and response to care.       Total physician intraservice time was 6 minutes. Recommendation:           - Return patient to hospital ward for ongoing care.                           - Continue present medications.                           -  Rectal exam w/ femal staff present - hard brown                            stool, scanty dark red blood sen on finger -                            repeated and confirmed - no mass                           Will schedule for colonoscopy with Dr. Fuller Plan                            tomorrow                           - Put patient on a clear liquid diet after                            breakfast today. Procedure Code(s):        --- Professional ---                           [phone], Esophagogastroduodenoscopy, flexible,                            transoral;  diagnostic, including collection of                            specimen(s) by brushing or washing, when performed                            (separate procedure) Diagnosis Code(s):        --- Professional ---                           K29.80, Duodenitis without bleeding                           K92.0, Hematemesis                           R19.5, Other fecal abnormalities CPT copyright 2016 [employer]. All rights reserved. The codes documented in this report are preliminary and upon coder review may  be revised to meet current compliance requirements. [name], MD 03/09/2017 9:54:16 AM This report has been signed electronically. Number of Addenda: 0

## 2017-03-09 NOTE — Progress Notes (Signed)
Patient given tap water enema tolerated well.  2 extra large bowel movements at this time.

## 2017-03-09 NOTE — Consult Note (Signed)
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 All questions answered. The patient agrees to proceed. 3) Further plans pending 1 4) check TSH re 5  I appreciate the opportunity to care for this patient.  [name], MD, [facility] [phone] (pager) 03/09/2017 7:19 AM           HPI:   Ashley [place] 83 [place] [facility]6 with hx of coffee ground emesis on presentation and after while hospitalized. Stools heme + also. No frank blood or melena reported.  CBC Latest Ref Rng & Units 03/09/2017 03/08/2017 03/07/2017  WBC 4.0 - 10.5 K/uL 8.7 7.0 6.5  Hemoglobin 12.0 - 15.0 g/dL 11.9(L) 12.0 12.2  Hematocrit 36.0 - 46.0 % 35.1(L) 35.1(L) 34.9(L)  Platelets 150 - 400 K/uL 265 269 240   She says she has a hx of an ulcer but no prior GI eval or endoscopies.  Improving overall and is to go to rehab but anti-thrombotic Tx planned and concern for problems with bleeding given her coffee ground emesis and heme + stool.  GI ROS + for mild constipation after back surgery several yrs ago. Otherwise all negative.  Past Medical History:  Diagnosis Date  . Anxiety   . Ataxia, post-stroke   . Back pain   . Chronic midline low back pain without sciatica   . CVA (cerebral vascular accident) ([place]) 03/03/2017  . Hypertension   . Right hip pain     Past Surgical History:  Procedure Laterality Date  . BACK SURGERY  2011  . CATARACT EXTRACTION, BILATERAL  1999    Family  History  Problem Relation Age of Onset  . Stroke Mother 30  . Lung disease Father        "black lung"   Social History  Substance Use Topics  . Smoking status: Former Smoker    Types: Cigarettes    Start date: 1949    Quit date: 1963  . Smokeless tobacco: Never Used  . Alcohol use No    Prior to Admission medications   Medication Sig Start Date End Date Taking? Authorizing Provider  amLODipine (NORVASC) 5 MG tablet Take 5 mg by mouth daily. 02/13/17  Yes [provider]  aspirin EC 81 MG tablet Take 81 mg by mouth daily with breakfast.   Yes [provider]  clonazePAM (KLONOPIN) 0.5 MG tablet Take 0.5 mg by mouth daily as needed for anxiety.   Yes [provider]  diclofenac (VOLTAREN) 75 MG EC tablet Take 75 mg by mouth 2 (two) times daily. WITH FOOD OR MILK 02/25/17  Yes [provider]  diclofenac sodium (VOLTAREN) 1 % GEL Apply 1 g topically 2 (two) times daily as needed (for pain or inflammation). TO AFFECTED AREAS 01/01/17  Yes [provider]  fluticasone (FLONASE) 50 MCG/ACT nasal spray Place 2 sprays into both nostrils daily as needed for allergies or rhinitis.  02/25/17  Yes [provider]  hydrochlorothiazide (HYDRODIURIL) 12.5 MG tablet Take 12.5 mg by mouth daily. 02/13/17  Yes [provider]  labetalol (NORMODYNE) 300 MG tablet Take 300 mg by mouth 2 (two) times daily.   Yes [provider]  PROAIR HFA 108 (90 Base) MCG/ACT inhaler Inhale 1 puff into the lungs every 6 (six) hours as needed for shortness of breath or wheezing. 01/30/17  Yes [provider]  traMADol (ULTRAM) 50 MG tablet Take 50 mg by mouth 2 (two) times daily as needed for pain. 01/30/17  Yes [provider]  Vitamin D, Ergocalciferol, (DRISDOL) 50000 units CAPS capsule Take 50,000 Units by mouth every 7 (seven) days. 12/09/16  Yes [provider]    Current Facility-Administered Medications  Medication Dose Route  Frequency Provider Last Rate Last Dose  . acetaminophen (TYLENOL) tablet 650 mg  650 mg Oral Q6H PRN Marliss Coots, PA-C   650 mg at 03/08/17 2229  . amLODipine (NORVASC) tablet 10 mg  10 mg Oral Daily Balinda Quails A, NP   10 mg at 03/08/17 0824  . hydrochlorothiazide (HYDRODIURIL) tablet 25 mg  25 mg Oral Daily Patteson, Samuel A, NP   25 mg at 03/08/17 0824  . labetalol (NORMODYNE) tablet 100 mg  100 mg Oral BID Rinehuls, David L, PA-C   100 mg at 03/08/17 2245  . ondansetron (ZOFRAN) injection 4 mg  4 mg Intravenous Q4H PRN Greta Doom, MD   4 mg at 03/06/17 0805  . pantoprazole (PROTONIX) EC tablet 40 mg  40 mg Oral Daily Rinehuls, David L, PA-C   40 mg at 03/08/17 1754  . potassium chloride (KLOR-CON) packet 20 mEq  20 mEq Oral BID Rinehuls, David L, PA-C   20 mEq at 03/08/17 2236  . senna-docusate (Senokot-S) tablet 1 tablet  1 tablet Oral QHS PRN Marliss Coots, PA-C   1 tablet at 03/05/17 2109  . Vitamin D (Ergocalciferol) (DRISDOL) capsule 50,000 Units  50,000 Units Oral Q7 days Charm Rings, NP   50,000 Units at 03/08/17 7341    Allergies as of 03/03/2017 - Review Complete 03/03/2017  Allergen Reaction Noted  . Benzyl alcohol  03/03/2017  . Ethanol  03/03/2017  . Lidocaine  03/03/2017  . Propranolol  03/03/2017  . Sulfa antibiotics  03/03/2017     Review of Systems:    This is positive for those things mentioned in the HPI. Also thyroid nodules on CT All other review of systems are negative.       Physical Exam:  Vital signs in last 24 hours: Temp:  [97.9 F (36.6 C)-99 F (37.2 C)] 98.1 F (36.7 C) (08/12 0555) Pulse Rate:  [73-84] 77 (08/12 0555) Resp:  [18-19] 18 (08/12 0555) BP: (145-171)/(63-71) 163/65 (08/12 0555) SpO2:  [94 %-96 %] 95 % (08/12 0555) Last BM Date: 03/06/17  General:  Well-developed, well-nourished and in no acute distress, younger than stated age Eyes:  anicteric. ENT:   Mouth shows dentures Lungs: Clear to  auscultation bilaterally. Heart:  S1S2, no rubs, murmurs, gallops. Abdomen:  soft, non-tender, no hepatosplenomegaly, hernia, or mass and BS+.  Lymph:  no cervical or supraclavicular adenopathy. Extremities:   no edema Skin   no rash. Neuro:  A&O x 3.  Psych:  appropriate mood and  Affect.   Data Reviewed:  CTA neck:  1. Patent carotid and vertebral arteries of the neck.  2. Moderate right carotid bifurcation calcified plaque with mild 30% proximal  ICA stenosis.  3. Severe left carotid bifurcation calcified plaque with severe 70% proximal ICA stenosis.  4. 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  5. Thyroid nodules measuring up to 15 mm. Further evaluation with thyroid ultrasound recommended.    Done and results pending  CTA head:  1. Patent circle of Willis. No large vessel occlusion, aneurysm, or vascular malformation identified.  2. Calcific plaque of the carotid siphons with mild right and moderate to severe left paraclinoid stenosis.  3. No appreciable left AICA, possibly diminutive or occluded.   Ct Head Wo Contrast 03/04/2017 1. Slight interval increase in size of left superior cerebellar hematoma as above with slightly worsened associated vasogenic edema. Associated mass effect on the adjacent fourth ventricle slightly worsened, although remains patent at this time. No hydrocephalus.  2. No other new acute intracranial process.   Dg Chest Port 1 View 03/03/2017 Improved aeration of the left lower lung. Atelectasis remains at both lung bases.  TTE 03/04/2017  Left ventricle: The cavity size was normal. There was moderate concentric hypertrophy. Systolic function was normal. The estimated ejection fraction was in the range of 60% to 65%. Wall motion was normal; there were no regional wall motion abnormalities  Carotid US 03/04/2017 The vertebral arteries  appear patent with antegrade flow. - Findings consistent with a 1-39 percent stenosis involving the right internal carotid artery. Right external artery demonstrates elevated velocicites. - Findings consistent with a 1-39% stenosis by diastolic velocity, peak systolic internal carotid artery velocities fall within the 60 - 79% stenosis range  LAB RESULTS:  Recent Labs  03/07/17 0514 03/08/17 0443 03/09/17 0210  WBC 6.5 7.0 8.7  HGB 12.2 12.0 11.9*  HCT 34.9* 35.1* 35.1*  PLT 240 269 265   BMET  Recent Labs  03/07/17 0514 03/08/17 0957 03/09/17 0210  NA 139 138 138  K 3.1* 3.0* 3.9  CL 102 98* 100*  CO2 27 32 27  GLUCOSE 110* 155* 137*  BUN 11 10 16   CREATININE 0.86 0.83 0.89  CALCIUM 8.8* 9.0 9.3   PT/INR  Recent Labs  03/07/17 0514  LABPROT 13.8  INR 1.06     Thanks   LOS: 6 days   @Safia Panzer  Simonne Maffucci, MD, [facility] @  03/09/2017, 7:18 AM

## 2017-03-09 NOTE — Progress Notes (Addendum)
  Speech Language Pathology Treatment: Cognitive-Linquistic  Patient Details Name: Ashley Chan MRN: [id number] DOB: [birth year] Today's Date: 03/09/2017 Time: 8916-9450 SLP Time Calculation (min) (ACUTE ONLY): 12 min  Assessment / Plan / Recommendation Clinical Impression  Pt seen today for cognitive-linguistic treatment. On previous evaluation, pt showed difficulty with awareness, recall of information, and functional problem solving. On this date, pt was able to recall recent visits with doctors, PT/ OT and their recommendations, answered simple functional problem solving questions without difficulty, aware of physical deficits, followed 2-3 step directions/ self-corrected x1. Daughter at bedside noted that pt seems to be at baseline with cognitive skills; daughter had observed previous evaluation administered and stated "she would have had a hard time with those tasks before coming to the hospital". Plan is for pt to move on to inpatient rehab soon; pt may benefit from a reassessment of cognitive skills/ determine need for continued services; pt and daughter in agreement with plan. Will sign off for acute needs. Please re-consult if needs arise.    HPI HPI: Patient is a 83 y/o female who presents with dizziness, N/V and some coffee-ground emesis. 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 PMH includes HTN.        SLP Plan  Continue with current plan of care       Recommendations                   Follow up Recommendations: Inpatient Rehab SLP Visit Diagnosis: Cognitive communication deficit (R41.841)        [place], [place], CCC-SLP 03/09/2017, 2:38 PM  [phone]

## 2017-03-09 NOTE — Progress Notes (Signed)
STROKE TEAM PROGRESS NOTE    History per H&P Dr. Leonel Ramsay 03/03/2017 Ashley Chan is an 83 y.o. female with history of hypertension who presented to [facility] emergency room complaining of not feeling well. Apparently patient woke up in the morning not feeling well and then started noticing dizziness followed by nausea vomiting and some coffee-ground emesis. Initial head CT was negative. Unfortunately they were unable to get MRI secondary to no MRIs being done at [facility] over the weekend. 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 For this reason patient was transferred to [facility] for further evaluation and care.  Date last known well: Date: 03/02/2017 Time last known well: Unable to determine tPA Given: No: Hemorrhagic transformation, no last known normal   Modified Rankin: Rankin Score=0   SUBJECTIVE (INTERVAL HISTORY) She feels she is moving in her bed better.  Grandson in room.  Nursing has noted Heme + stools (noted h/o coffee-ground emesis on admission).  No other new neurological complaints.  ROS Review of Systems  Neurological: Positive for weakness.    OBJECTIVE Temp:  [97.9 F (36.6 C)-99 F (37.2 C)] 98.1 F (36.7 C) (08/12 0555) Pulse Rate:  [73-84] 77 (08/12 0555) Cardiac Rhythm: Normal sinus rhythm (08/12 0700) Resp:  [18-19] 18 (08/12 0555) BP: (145-171)/(63-71) 163/65 (08/12 0555) SpO2:  [94 %-96 %] 95 % (08/12 0555)   MEDICATIONS   . amLODipine  10 mg Oral Daily  . hydrochlorothiazide  25 mg Oral Daily  . labetalol  100 mg Oral BID  . pantoprazole  40 mg Oral Daily  . potassium chloride  20 mEq Oral BID  . Vitamin D (Ergocalciferol)  50,000 Units Oral Q7 days   acetaminophen, ondansetron (ZOFRAN) IV, senna-docusate   CBC:   Recent Labs Lab 03/07/17 0514 03/08/17 0443 03/09/17 0210    WBC 6.5 7.0 8.7  NEUTROABS 3.5  --   --   HGB 12.2 12.0 11.9*  HCT 34.9* 35.1* 35.1*  MCV 92.1 91.9 92.1  PLT 240 269 078    Basic Metabolic Panel:   Recent Labs Lab 03/08/17 0957 03/09/17 0210  NA 138 138  K 3.0* 3.9  CL 98* 100*  CO2 32 27  GLUCOSE 155* 137*  BUN 10 16  CREATININE 0.83 0.89  CALCIUM 9.0 9.3  MG 2.1 2.0  PHOS 3.9 4.6    Lipid Panel:     Component Value Date/Time   CHOL 216 (H) 03/04/2017 0329   TRIG 969 (H) 03/04/2017 0329   HDL 26 (L) 03/04/2017 0329   CHOLHDL 8.3 03/04/2017 0329   VLDL UNABLE TO CALCULATE IF TRIGLYCERIDE OVER 400 mg/dL 03/04/2017 0329   LDLCALC UNABLE TO CALCULATE IF TRIGLYCERIDE OVER 400 mg/dL 03/04/2017 0329   HgbA1c:  Lab Results  Component Value Date   HGBA1C 5.4 03/04/2017   Urine Drug Screen: No results found for: LABOPIA, COCAINSCRNUR, LABBENZ, AMPHETMU, THCU, LABBARB  Alcohol Level No results found for: ETH  IMAGING  Ct Angio Head W Or Wo Contrast Ct Angio Neck W Or Wo Contrast 03/03/2017   CT head:  1. Left superior cerebellar hemorrhage, associated vasogenic edema, and mass effect are stable in comparison with prior MRI given differences in technique.  2. Stable mild superior transtentorial herniation, partial effacement of quadrigeminal plate cistern, and of fourth ventricle. No hydrocephalus.  3. No new acute intracranial abnormality.  CTA neck:  1. Patent carotid and vertebral arteries of the neck.  2. Moderate right carotid bifurcation calcified plaque with mild 30% proximal ICA stenosis.  3. Severe left carotid bifurcation calcified plaque with severe 70% proximal ICA stenosis.  4. 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  5. Thyroid nodules measuring up to 15 mm. Further evaluation with thyroid ultrasound recommended.   CTA head:  1. Patent circle of Willis. No large vessel occlusion, aneurysm,  or vascular malformation identified.  2. Calcific plaque of the carotid siphons with mild right and moderate to severe left paraclinoid stenosis.  3. No appreciable left AICA, possibly diminutive or occluded.   Ct Head Wo Contrast 03/04/2017 1. Slight interval increase in size of left superior cerebellar hematoma as above with slightly worsened associated vasogenic edema. Associated mass effect on the adjacent fourth ventricle slightly worsened, although remains patent at this time. No hydrocephalus.  2. No other new acute intracranial process.   Dg Chest Port 1 View 03/03/2017 Improved aeration of the left lower lung. Atelectasis remains at both lung bases.  TTE 03/04/2017  Left ventricle: The cavity size was normal. There was moderate   concentric hypertrophy. Systolic function was normal. The   estimated ejection fraction was in the range of 60% to 65%. Wall   motion was normal; there were no regional wall motion   abnormalities  Carotid US 03/04/2017 The vertebral arteries appear patent with antegrade flow. - Findings consistent with a 1-39 percent stenosis involving the   right internal carotid artery. Right external artery demonstrates   elevated velocicites. - Findings consistent with a 1-39% stenosis by diastolic velocity,   peak systolic internal carotid artery velocities fall within the   60 - 79% stenosis range    Thyroid US 03/09/2017 IMPRESSION: Bilateral nodules 1 and 2 meet criteria for annual follow-up.   PHYSICAL EXAM Pleasant elderly African-American lady currently not in distress.  Afebrile. Head is nontraumatic. Neck is supple without bruit.    Cardiac exam no murmur or gallop. Lungs are clear to auscultation. Distal pulses are well felt.  Neurological Exam : Awake alert oriented 3. Diminished attention and recall. Follows commands well. Extraocular movements are full range but there is saccadic dysmetria on left gaze. Vision acuity and fields seem adequate. Face  is symmetric without weakness. Tongue is midline. Mild weakness of left grip and intrinsic hand muscles. Fine finger movements are diminished on the left compared to the right. No upper or lower extremity drift noted. Significant dysmetria L> R.  Deep tendon flow is symmetric. Plantars downgoing. Gait not tested.   ASSESSMENT/PLAN [name] is a 83 y.o. female with history of hypertension presenting with dizziness, nausea, and coffee ground emesis.  She did not receive IV t-PA due to [place].   Stroke: Left superior cerebellar hematoma with increased vasogenic edema and mass effect on adjacent fourth ventricle without hydrocephalus.  Resultant  mild left hemi-ataxia  CT head: Slightly increased left superior cerebellar hematoma with slightly increased vasogenic edema.  Mass effect on adjacent fourth ventricle without hydrocephalus.    CTA Head/Neck: 70% proximal Lt ICA stenosis/ 30% proximal Rt ICA stenosis, Severe cervical stenosis (C5/6), Thyroid nodule         Patent circle of willis (no LVO), mod-sever Lt paraclinoid stenosis, no appreciable Lt AICA  MRI head:  Done at [facility] hospital reviewed  MRA head: not ordered  Carotid Doppler: EF  60-65%. No source of embolus  2D Echo: B ICA 1-39% stenosis, VAs antegrade  LDL > 400  HgbA1c 5.4  SCDs for VTE prophylaxis Diet NPO time specified Except for: Sips with Meds  No antithrombotic prior to admission, now on No antithrombotic due to hemorrhage  Patient counseled to be compliant with her antithrombotic medications  Ongoing aggressive stroke risk factor management  Therapy recommendations: CIR  Disposition: Discharge and readmit to inpatient rehab  Hypertension  Unstable (SBP's 150's- 180's)- Labetolol 100 mg bid added  Clevidipine gtt off since 03/06/2017  Goal SBP < 180 mmHg  Amlodipine 10mg  PO daily   HCTZ 25mg  PO daily  Long-term BP goal normotensive  Hyperlipidemia  Home meds: none  LDL > 400, goal <  70  Statin held due to [place]  Start statin prior to discharge  Other Stroke Risk Factors  Advanced age  Upper GI Bleed  Nurse reports heme positive stool - coffee grounds emesis - DC Lovenox -> SCDs    GI Consult Dr Carlean Purl performed EGD, showed minimal inflammation . ASA 81 mg daily on hold. H&H stable.  Other Active Problems   Rehab discharge on Hold until GI consult and address Heme + stools  Hypokalemia - 3.0 -> 3.9  Thyroid nodules measuring up to 15 mm. [place] 03/09/2017 -> annual f/u recommended.  Rehab admission on hold.  Severe cervical stenosis by CTA neck- MRI C spine - pending       Hospital day # 6  I have personally examined this patient, reviewed notes, independently viewed imaging studies, participated in medical decision making and plan of care.ROS completed by me personally and pertinent positives fully documented  I have made any additions or clarifications directly to the above note.    She has remained neurologically stable, but has now had some Heme + stools that will need to be addressed before discharge to Rehab.Also, will obtain C-spine MRI for ? Cervical stenosis by CTA, and Thyroid U/S.   To contact Stroke Continuity provider, please refer to [email or web address] After hours, contact General Neurology

## 2017-03-10 ENCOUNTER — Inpatient Hospital Stay (HOSPITAL_COMMUNITY): Payer: Medicare Other | Admitting: Anesthesiology

## 2017-03-10 ENCOUNTER — Inpatient Hospital Stay (HOSPITAL_COMMUNITY): Payer: No Typology Code available for payment source | Admitting: Speech Pathology

## 2017-03-10 ENCOUNTER — Inpatient Hospital Stay (HOSPITAL_COMMUNITY): Payer: No Typology Code available for payment source | Admitting: Occupational Therapy

## 2017-03-10 ENCOUNTER — Inpatient Hospital Stay (HOSPITAL_COMMUNITY): Payer: Medicare Other

## 2017-03-10 ENCOUNTER — Inpatient Hospital Stay (HOSPITAL_COMMUNITY): Payer: No Typology Code available for payment source | Admitting: Physical Therapy

## 2017-03-10 ENCOUNTER — Encounter (HOSPITAL_COMMUNITY): Payer: Self-pay

## 2017-03-10 ENCOUNTER — Encounter (HOSPITAL_COMMUNITY)
Admission: AD | Disposition: A | Discharge status: Rehabilitation Facility | Payer: Self-pay | Source: Other Acute Inpatient Hospital | Attending: Neurology

## 2017-03-10 ENCOUNTER — Inpatient Hospital Stay (HOSPITAL_COMMUNITY)
Admission: RE | Admit: 2017-03-10 | Discharge: 2017-03-22 | DRG: 092 | Disposition: A | Discharge status: Home Health | Payer: Medicare Other | Source: Intra-hospital | Attending: Physical Medicine & Rehabilitation | Admitting: Physical Medicine & Rehabilitation

## 2017-03-10 DIAGNOSIS — K5900 Constipation, unspecified: Secondary | ICD-10-CM | POA: Diagnosis present

## 2017-03-10 DIAGNOSIS — Z823 Family history of stroke: Secondary | ICD-10-CM | POA: Diagnosis not present

## 2017-03-10 DIAGNOSIS — K920 Hematemesis: Secondary | ICD-10-CM

## 2017-03-10 DIAGNOSIS — I639 Cerebral infarction, unspecified: Secondary | ICD-10-CM | POA: Diagnosis not present

## 2017-03-10 DIAGNOSIS — I69993 Ataxia following unspecified cerebrovascular disease: Secondary | ICD-10-CM | POA: Diagnosis not present

## 2017-03-10 DIAGNOSIS — K2980 Duodenitis without bleeding: Secondary | ICD-10-CM | POA: Diagnosis present

## 2017-03-10 DIAGNOSIS — K635 Polyp of colon: Secondary | ICD-10-CM

## 2017-03-10 DIAGNOSIS — Z881 Allergy status to other antibiotic agents status: Secondary | ICD-10-CM

## 2017-03-10 DIAGNOSIS — Z79891 Long term (current) use of opiate analgesic: Secondary | ICD-10-CM | POA: Diagnosis not present

## 2017-03-10 DIAGNOSIS — I610 Nontraumatic intracerebral hemorrhage in hemisphere, subcortical: Secondary | ICD-10-CM

## 2017-03-10 DIAGNOSIS — G4700 Insomnia, unspecified: Secondary | ICD-10-CM | POA: Diagnosis present

## 2017-03-10 DIAGNOSIS — R269 Unspecified abnormalities of gait and mobility: Secondary | ICD-10-CM

## 2017-03-10 DIAGNOSIS — D123 Benign neoplasm of transverse colon: Secondary | ICD-10-CM

## 2017-03-10 DIAGNOSIS — R42 Dizziness and giddiness: Secondary | ICD-10-CM

## 2017-03-10 DIAGNOSIS — M545 Low back pain: Secondary | ICD-10-CM | POA: Diagnosis present

## 2017-03-10 DIAGNOSIS — Z79899 Other long term (current) drug therapy: Secondary | ICD-10-CM

## 2017-03-10 DIAGNOSIS — E876 Hypokalemia: Secondary | ICD-10-CM | POA: Diagnosis present

## 2017-03-10 DIAGNOSIS — R2689 Other abnormalities of gait and mobility: Secondary | ICD-10-CM | POA: Diagnosis present

## 2017-03-10 DIAGNOSIS — I69393 Ataxia following cerebral infarction: Secondary | ICD-10-CM

## 2017-03-10 DIAGNOSIS — Z7982 Long term (current) use of aspirin: Secondary | ICD-10-CM | POA: Diagnosis not present

## 2017-03-10 DIAGNOSIS — Z87891 Personal history of nicotine dependence: Secondary | ICD-10-CM

## 2017-03-10 DIAGNOSIS — K640 First degree hemorrhoids: Secondary | ICD-10-CM

## 2017-03-10 DIAGNOSIS — M4802 Spinal stenosis, cervical region: Secondary | ICD-10-CM

## 2017-03-10 DIAGNOSIS — F419 Anxiety disorder, unspecified: Secondary | ICD-10-CM | POA: Diagnosis present

## 2017-03-10 DIAGNOSIS — R195 Other fecal abnormalities: Secondary | ICD-10-CM

## 2017-03-10 DIAGNOSIS — K633 Ulcer of intestine: Secondary | ICD-10-CM

## 2017-03-10 DIAGNOSIS — K5730 Diverticulosis of large intestine without perforation or abscess without bleeding: Secondary | ICD-10-CM

## 2017-03-10 DIAGNOSIS — I10 Essential (primary) hypertension: Secondary | ICD-10-CM | POA: Diagnosis present

## 2017-03-10 DIAGNOSIS — I63233 Cerebral infarction due to unspecified occlusion or stenosis of bilateral carotid arteries: Secondary | ICD-10-CM

## 2017-03-10 DIAGNOSIS — K921 Melena: Secondary | ICD-10-CM | POA: Diagnosis present

## 2017-03-10 DIAGNOSIS — Z888 Allergy status to other drugs, medicaments and biological substances status: Secondary | ICD-10-CM | POA: Diagnosis not present

## 2017-03-10 HISTORY — PX: COLONOSCOPY WITH PROPOFOL: SHX5780

## 2017-03-10 SURGERY — COLONOSCOPY WITH PROPOFOL
Anesthesia: Monitor Anesthesia Care

## 2017-03-10 MED ORDER — GUAIFENESIN-DM 100-10 MG/5ML PO SYRP: 10.0000 mL | ORAL_SOLUTION | Freq: Four times a day (QID) | ORAL | Status: DC | PRN

## 2017-03-10 MED ORDER — TRAZODONE HCL 50 MG PO TABS: 25.0000 mg | ORAL_TABLET | Freq: Every evening | ORAL | Status: DC | PRN

## 2017-03-10 MED ORDER — HYDROCERIN EX CREA
TOPICAL_CREAM | Freq: Two times a day (BID) | CUTANEOUS | Status: DC
  Administered 2017-03-10 – 2017-03-22 (×20): via TOPICAL
  Filled 2017-03-10: qty 113

## 2017-03-10 MED ORDER — CLONAZEPAM 0.5 MG PO TABS
0.5000 mg | ORAL_TABLET | Freq: Every day | ORAL | Status: DC | PRN
  Administered 2017-03-15: 0.5 mg via ORAL
  Filled 2017-03-10 (×2): qty 1

## 2017-03-10 MED ORDER — DIPHENHYDRAMINE HCL 12.5 MG/5ML PO ELIX: 12.5000 mg | ORAL_SOLUTION | Freq: Four times a day (QID) | ORAL | Status: DC | PRN

## 2017-03-10 MED ORDER — PANTOPRAZOLE SODIUM 40 MG PO TBEC
40.0000 mg | DELAYED_RELEASE_TABLET | Freq: Every day | ORAL | Status: DC
  Administered 2017-03-11 – 2017-03-22 (×12): 40 mg via ORAL
  Filled 2017-03-10 (×12): qty 1

## 2017-03-10 MED ORDER — FLEET ENEMA 7-19 GM/118ML RE ENEM: 1.0000 | ENEMA | Freq: Once | RECTAL | Status: DC | PRN

## 2017-03-10 MED ORDER — PROPOFOL 500 MG/50ML IV EMUL
INTRAVENOUS | Status: DC | PRN
  Administered 2017-03-10: 100 ug/kg/min via INTRAVENOUS

## 2017-03-10 MED ORDER — ACETAMINOPHEN 325 MG PO TABS
325.0000 mg | ORAL_TABLET | ORAL | Status: DC | PRN
  Administered 2017-03-13 – 2017-03-15 (×3): 650 mg via ORAL
  Filled 2017-03-10 (×3): qty 2

## 2017-03-10 MED ORDER — FLUTICASONE PROPIONATE 50 MCG/ACT NA SUSP: 2.0000 | Freq: Every day | NASAL | Status: DC | PRN

## 2017-03-10 MED ORDER — PROCHLORPERAZINE EDISYLATE 5 MG/ML IJ SOLN: 5.0000 mg | Freq: Four times a day (QID) | INTRAMUSCULAR | Status: DC | PRN

## 2017-03-10 MED ORDER — ATORVASTATIN CALCIUM 40 MG PO TABS: 40.0000 mg | ORAL_TABLET | Freq: Every day | ORAL | Status: DC

## 2017-03-10 MED ORDER — LABETALOL HCL 100 MG PO TABS: 100.0000 mg | ORAL_TABLET | Freq: Two times a day (BID) | ORAL | Status: DC

## 2017-03-10 MED ORDER — PROPOFOL 10 MG/ML IV BOLUS
INTRAVENOUS | Status: DC | PRN
  Administered 2017-03-10: 20 mg via INTRAVENOUS

## 2017-03-10 MED ORDER — GUAIFENESIN-DM 100-10 MG/5ML PO SYRP: 5.0000 mL | ORAL_SOLUTION | Freq: Four times a day (QID) | ORAL | Status: DC | PRN

## 2017-03-10 MED ORDER — POLYETHYLENE GLYCOL 3350 17 G PO PACK: 17.0000 g | PACK | Freq: Every day | ORAL | Status: DC | PRN

## 2017-03-10 MED ORDER — AMLODIPINE BESYLATE 5 MG PO TABS: 5.0000 mg | ORAL_TABLET | Freq: Every day | ORAL | Status: DC

## 2017-03-10 MED ORDER — ASPIRIN EC 325 MG PO TBEC: 325.0000 mg | DELAYED_RELEASE_TABLET | Freq: Every day | ORAL | Status: DC

## 2017-03-10 MED ORDER — PHENYLEPHRINE 40 MCG/ML (10ML) SYRINGE FOR IV PUSH (FOR BLOOD PRESSURE SUPPORT)
PREFILLED_SYRINGE | INTRAVENOUS | Status: DC | PRN
  Administered 2017-03-10: 80 ug via INTRAVENOUS

## 2017-03-10 MED ORDER — ATORVASTATIN CALCIUM 40 MG PO TABS
40.0000 mg | ORAL_TABLET | Freq: Every day | ORAL | Status: DC
  Administered 2017-03-10 – 2017-03-21 (×12): 40 mg via ORAL
  Filled 2017-03-10 (×12): qty 1

## 2017-03-10 MED ORDER — PROCHLORPERAZINE 25 MG RE SUPP: 12.5000 mg | Freq: Four times a day (QID) | RECTAL | Status: DC | PRN

## 2017-03-10 MED ORDER — TRAMADOL HCL 50 MG PO TABS
50.0000 mg | ORAL_TABLET | Freq: Two times a day (BID) | ORAL | Status: DC | PRN
  Administered 2017-03-15 – 2017-03-19 (×2): 50 mg via ORAL
  Filled 2017-03-10 (×4): qty 1

## 2017-03-10 MED ORDER — VITAMIN D (ERGOCALCIFEROL) 1.25 MG (50000 UNIT) PO CAPS
50000.0000 [IU] | ORAL_CAPSULE | ORAL | Status: DC
  Administered 2017-03-15 – 2017-03-22 (×2): 50000 [IU] via ORAL
  Filled 2017-03-10 (×2): qty 1

## 2017-03-10 MED ORDER — HYDROCHLOROTHIAZIDE 25 MG PO TABS: 12.5000 mg | ORAL_TABLET | Freq: Every day | ORAL | Status: DC

## 2017-03-10 MED ORDER — LABETALOL HCL 100 MG PO TABS
100.0000 mg | ORAL_TABLET | Freq: Two times a day (BID) | ORAL | Status: DC
  Administered 2017-03-10 – 2017-03-22 (×24): 100 mg via ORAL
  Filled 2017-03-10 (×24): qty 1

## 2017-03-10 MED ORDER — ASPIRIN EC 325 MG PO TBEC
325.0000 mg | DELAYED_RELEASE_TABLET | Freq: Every day | ORAL | Status: DC
Start: 2017-03-11 — End: 2017-03-10

## 2017-03-10 MED ORDER — VITAMIN D (ERGOCALCIFEROL) 1.25 MG (50000 UNIT) PO CAPS: 50000.0000 [IU] | ORAL_CAPSULE | ORAL | Status: DC

## 2017-03-10 MED ORDER — ENOXAPARIN SODIUM 40 MG/0.4ML ~~LOC~~ SOLN
40.0000 mg | SUBCUTANEOUS | Status: DC
  Administered 2017-03-10 – 2017-03-21 (×12): 40 mg via SUBCUTANEOUS
  Filled 2017-03-10 (×12): qty 0.4

## 2017-03-10 MED ORDER — PROCHLORPERAZINE MALEATE 5 MG PO TABS
5.0000 mg | ORAL_TABLET | Freq: Four times a day (QID) | ORAL | Status: DC | PRN
  Administered 2017-03-15 – 2017-03-21 (×2): 10 mg via ORAL
  Filled 2017-03-10 (×3): qty 2

## 2017-03-10 MED ORDER — LABETALOL HCL 300 MG PO TABS
300.0000 mg | ORAL_TABLET | Freq: Two times a day (BID) | ORAL | Status: DC
Start: 2017-03-10 — End: 2017-03-10

## 2017-03-10 MED ORDER — HYDROCHLOROTHIAZIDE 25 MG PO TABS
25.0000 mg | ORAL_TABLET | Freq: Every day | ORAL | Status: DC
  Administered 2017-03-10 – 2017-03-22 (×13): 25 mg via ORAL
  Filled 2017-03-10 (×13): qty 1

## 2017-03-10 MED ORDER — ONDANSETRON HCL 4 MG/2ML IJ SOLN
INTRAMUSCULAR | Status: DC | PRN
  Administered 2017-03-10: 4 mg via INTRAVENOUS

## 2017-03-10 MED ORDER — POTASSIUM CHLORIDE 20 MEQ/15ML (10%) PO SOLN
10.0000 meq | Freq: Two times a day (BID) | ORAL | Status: DC
  Administered 2017-03-10 – 2017-03-22 (×24): 10 meq via ORAL
  Filled 2017-03-10 (×24): qty 15

## 2017-03-10 MED ORDER — ALBUTEROL SULFATE HFA 108 (90 BASE) MCG/ACT IN AERS: 1.0000 | INHALATION_SPRAY | Freq: Four times a day (QID) | RESPIRATORY_TRACT | Status: DC | PRN

## 2017-03-10 MED ORDER — ALBUTEROL SULFATE (2.5 MG/3ML) 0.083% IN NEBU: 2.5000 mg | INHALATION_SOLUTION | Freq: Four times a day (QID) | RESPIRATORY_TRACT | Status: DC | PRN

## 2017-03-10 MED ORDER — BISACODYL 10 MG RE SUPP: 10.0000 mg | Freq: Every day | RECTAL | Status: DC | PRN

## 2017-03-10 MED ORDER — ALUM & MAG HYDROXIDE-SIMETH 200-200-20 MG/5ML PO SUSP: 30.0000 mL | ORAL | Status: DC | PRN

## 2017-03-10 MED ORDER — ASPIRIN EC 81 MG PO TBEC: 81.0000 mg | DELAYED_RELEASE_TABLET | Freq: Every day | ORAL | Status: DC

## 2017-03-10 MED ORDER — AMLODIPINE BESYLATE 10 MG PO TABS
10.0000 mg | ORAL_TABLET | Freq: Every day | ORAL | Status: DC
  Administered 2017-03-10 – 2017-03-22 (×13): 10 mg via ORAL
  Filled 2017-03-10 (×13): qty 1

## 2017-03-10 MED ORDER — DICLOFENAC SODIUM 75 MG PO TBEC: 75.0000 mg | DELAYED_RELEASE_TABLET | Freq: Two times a day (BID) | ORAL | Status: DC

## 2017-03-10 MED ORDER — POTASSIUM CHLORIDE 20 MEQ PO PACK: 20.0000 meq | PACK | Freq: Two times a day (BID) | ORAL | Status: DC

## 2017-03-10 SURGICAL SUPPLY — 22 items

## 2017-03-10 NOTE — Progress Notes (Signed)
Inpatient Rehabilitation  Continuing to follow for timing of medical readiness prior to IP Rehab admission.  Following procedure, plan to follow up with team.  Please call with questions.  Carmelia Roller., CCC/SLP Admission Coordinator  [place]  Cell [phone]

## 2017-03-10 NOTE — Progress Notes (Signed)
Inpatient Rehabilitation  Received call from Dr. Erlinda Hong that patient is medically stable to admit to IP Rehab today.  I have met with patient and daughters and they are in agreement with plan.  Plan to proceed with admitting patient to IP Rehab today.  Please call with questions.   Carmelia Roller., CCC/SLP Admission Coordinator  [place]  Cell [phone]

## 2017-03-10 NOTE — H&P (View-Only) (Signed)
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 All questions answered. The patient agrees to proceed. 3) Further plans pending 1 4) check TSH re 5  I appreciate the opportunity to care for this patient.  [name], MD, [facility] [phone] (pager) 03/09/2017 7:19 AM           HPI:   Ash[place] 83 [facility] [facility] on [facility]/6 with hx of coffee ground emesis on presentation and after while hospitalized. Stools heme + also. No frank blood or melena reported.  CBC Latest Ref Rng & Units 03/09/2017 03/08/2017 03/07/2017  WBC 4.0 - 10.5 K/uL 8.7 7.0 6.5  Hemoglobin 12.0 - 15.0 g/dL 11.9(L) 12.0 12.2  Hematocrit 36.0 - 46.0 % 35.1(L) 35.1(L) 34.9(L)  Platelets 150 - 400 K/uL 265 269 240   She says she has a hx of an ulcer but no prior GI eval or endoscopies.  Improving overall and is to go to rehab but anti-thrombotic Tx planned and concern for problems with bleeding given her coffee ground emesis and heme + stool.  GI ROS + for mild constipation after back surgery several yrs ago. Otherwise all negative.  Past Medical History:  Diagnosis Date  . Anxiety   . Ataxia, post-stroke   . Back pain   . Chronic midline low back pain without sciatica   . CVA (cerebral vascular accident) ([place]) 03/03/2017  . Hypertension   . Right hip pain     Past Surgical History:  Procedure Laterality Date  . BACK SURGERY  2011  . CATARACT EXTRACTION, BILATERAL  1999    Family  History  Problem Relation Age of Onset  . Stroke Mother 17  . Lung disease Father        "black lung"   Social History  Substance Use Topics  . Smoking status: Former Smoker    Types: Cigarettes    Start date: 1949    Quit date: 1963  . Smokeless tobacco: Never Used  . Alcohol use No    Prior to Admission medications   Medication Sig Start Date End Date Taking? Authorizing Provider  amLODipine (NORVASC) 5 MG tablet Take 5 mg by mouth daily. 02/13/17  Yes [provider]  aspirin EC 81 MG tablet Take 81 mg by mouth daily with breakfast.   Yes [provider]  clonazePAM (KLONOPIN) 0.5 MG tablet Take 0.5 mg by mouth daily as needed for anxiety.   Yes [provider]  diclofenac (VOLTAREN) 75 MG EC tablet Take 75 mg by mouth 2 (two) times daily. WITH FOOD OR MILK 02/25/17  Yes [provider]  diclofenac sodium (VOLTAREN) 1 % GEL Apply 1 g topically 2 (two) times daily as needed (for pain or inflammation). TO AFFECTED AREAS 01/01/17  Yes [provider]  fluticasone (FLONASE) 50 MCG/ACT nasal spray Place 2 sprays into both nostrils daily as needed for allergies or rhinitis.  02/25/17  Yes [provider]  hydrochlorothiazide (HYDRODIURIL) 12.5 MG tablet Take 12.5 mg by mouth daily. 02/13/17  Yes [provider]  labetalol (NORMODYNE) 300 MG tablet Take 300 mg by mouth 2 (two) times daily.   Yes [provider]  PROAIR HFA 108 (90 Base) MCG/ACT inhaler Inhale 1 puff into the lungs every 6 (six) hours as needed for shortness of breath or wheezing. 01/30/17  Yes [provider]  traMADol (ULTRAM) 50 MG tablet Take 50 mg by mouth 2 (two) times daily as needed for pain. 01/30/17  Yes [provider]  Vitamin D, Ergocalciferol, (DRISDOL) 50000 units CAPS capsule Take 50,000 Units by mouth every 7 (seven) days. 12/09/16  Yes [provider]    Current Facility-Administered Medications  Medication Dose Route  Frequency Provider Last Rate Last Dose  . acetaminophen (TYLENOL) tablet 650 mg  650 mg Oral Q6H PRN Marliss Coots, PA-C   650 mg at 03/08/17 2229  . amLODipine (NORVASC) tablet 10 mg  10 mg Oral Daily Balinda Quails A, NP   10 mg at 03/08/17 0824  . hydrochlorothiazide (HYDRODIURIL) tablet 25 mg  25 mg Oral Daily Patteson, Samuel A, NP   25 mg at 03/08/17 0824  . labetalol (NORMODYNE) tablet 100 mg  100 mg Oral BID Rinehuls, David L, PA-C   100 mg at 03/08/17 2245  . ondansetron (ZOFRAN) injection 4 mg  4 mg Intravenous Q4H PRN Greta Doom, MD   4 mg at 03/06/17 0805  . pantoprazole (PROTONIX) EC tablet 40 mg  40 mg Oral Daily Rinehuls, David L, PA-C   40 mg at 03/08/17 1754  . potassium chloride (KLOR-CON) packet 20 mEq  20 mEq Oral BID Rinehuls, David L, PA-C   20 mEq at 03/08/17 2236  . senna-docusate (Senokot-S) tablet 1 tablet  1 tablet Oral QHS PRN Marliss Coots, PA-C   1 tablet at 03/05/17 2109  . Vitamin D (Ergocalciferol) (DRISDOL) capsule 50,000 Units  50,000 Units Oral Q7 days Charm Rings, NP   50,000 Units at 03/08/17 1610    Allergies as of 03/03/2017 - Review Complete 03/03/2017  Allergen Reaction Noted  . Benzyl alcohol  03/03/2017  . Ethanol  03/03/2017  . Lidocaine  03/03/2017  . Propranolol  03/03/2017  . Sulfa antibiotics  03/03/2017     Review of Systems:    This is positive for those things mentioned in the HPI. Also thyroid nodules on CT All other review of systems are negative.       Physical Exam:  Vital signs in last 24 hours: Temp:  [97.9 F (36.6 C)-99 F (37.2 C)] 98.1 F (36.7 C) (08/12 0555) Pulse Rate:  [73-84] 77 (08/12 0555) Resp:  [18-19] 18 (08/12 0555) BP: (145-171)/(63-71) 163/65 (08/12 0555) SpO2:  [94 %-96 %] 95 % (08/12 0555) Last BM Date: 03/06/17  General:  Well-developed, well-nourished and in no acute distress, younger than stated age Eyes:  anicteric. ENT:   Mouth shows dentures Lungs: Clear to  auscultation bilaterally. Heart:  S1S2, no rubs, murmurs, gallops. Abdomen:  soft, non-tender, no hepatosplenomegaly, hernia, or mass and BS+.  Lymph:  no cervical or supraclavicular adenopathy. Extremities:   no edema Skin   no rash. Neuro:  A&O x 3.  Psych:  appropriate mood and  Affect.   Data Reviewed:  CTA neck:  1. Patent carotid and vertebral arteries of the neck.  2. Moderate right carotid bifurcation calcified plaque with mild 30% proximal  ICA stenosis.  3. Severe left carotid bifurcation calcified plaque with severe 70% proximal ICA stenosis.  4. 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  5. Thyroid nodules measuring up to 15 mm. Further evaluation with thyroid ultrasound recommended.    Done and results pending  CTA head:  1. Patent circle of Willis. No large vessel occlusion, aneurysm, or vascular malformation identified.  2. Calcific plaque of the carotid siphons with mild right and moderate to severe left paraclinoid stenosis.  3. No appreciable left AICA, possibly diminutive or occluded.   Ct Head Wo Contrast 03/04/2017 1. Slight interval increase in size of left superior cerebellar hematoma as above with slightly worsened associated vasogenic edema. Associated mass effect on the adjacent fourth ventricle slightly worsened, although remains patent at this time. No hydrocephalus.  2. No other new acute intracranial process.   Dg Chest Port 1 View 03/03/2017 Improved aeration of the left lower lung. Atelectasis remains at both lung bases.  TTE 03/04/2017  Left ventricle: The cavity size was normal. There was moderate concentric hypertrophy. Systolic function was normal. The estimated ejection fraction was in the range of 60% to 65%. Wall motion was normal; there were no regional wall motion abnormalities  Carotid US 03/04/2017 The vertebral arteries  appear patent with antegrade flow. - Findings consistent with a 1-39 percent stenosis involving the right internal carotid artery. Right external artery demonstrates elevated velocicites. - Findings consistent with a 1-39% stenosis by diastolic velocity, peak systolic internal carotid artery velocities fall within the 60 - 79% stenosis range  LAB RESULTS:  Recent Labs  03/07/17 0514 03/08/17 0443 03/09/17 0210  WBC 6.5 7.0 8.7  HGB 12.2 12.0 11.9*  HCT 34.9* 35.1* 35.1*  PLT 240 269 265   BMET  Recent Labs  03/07/17 0514 03/08/17 0957 03/09/17 0210  NA 139 138 138  K 3.1* 3.0* 3.9  CL 102 98* 100*  CO2 27 32 27  GLUCOSE 110* 155* 137*  BUN 11 10 16   CREATININE 0.86 0.83 0.89  CALCIUM 8.8* 9.0 9.3   PT/INR  Recent Labs  03/07/17 0514  LABPROT 13.8  INR 1.06     Thanks   LOS: 6 days   @Carl  Simonne Maffucci, MD, [facility] @  03/09/2017, 7:18 AM

## 2017-03-10 NOTE — Progress Notes (Signed)
Report received from [place], [place].  Patient is NPO after midnight for scheduled colonoscopy 03/10/17. Log rolls onto bedpan. Alert and oriented X 4, patient is compliant and cooperative. Daughter at bedside. Bed alarm on.  Safety maintained. Will continue  To monitor.

## 2017-03-10 NOTE — Anesthesia Preprocedure Evaluation (Addendum)
Anesthesia Evaluation  Patient identified by MRN, date of birth, ID band Patient awake    Reviewed: Allergy & Precautions, NPO status , Patient's Chart, lab work & pertinent test results  Airway Mallampati: II  TM Distance: >3 FB Neck ROM: Full    Dental  (+) Edentulous Upper   Pulmonary former smoker,    breath sounds clear to auscultation       Cardiovascular hypertension, Pt. on medications  Rhythm:Regular Rate:Normal     Neuro/Psych CVA    GI/Hepatic negative GI ROS, Neg liver ROS,   Endo/Other  negative endocrine ROS  Renal/GU negative Renal ROS     Musculoskeletal negative musculoskeletal ROS (+)   Abdominal   Peds  Hematology  (+) anemia ,   Anesthesia Other Findings   Reproductive/Obstetrics                            Lab Results  Component Value Date   WBC 8.7 03/09/2017   HGB 11.9 (L) 03/09/2017   HCT 35.1 (L) 03/09/2017   MCV 92.1 03/09/2017   PLT 265 03/09/2017    Anesthesia Physical Anesthesia Plan  ASA: III  Anesthesia Plan: MAC   Post-op Pain Management:    Induction: Intravenous  PONV Risk Score and Plan: 2 and Ondansetron and Propofol infusion  Airway Management Planned: Natural Airway and Simple Face Mask  Additional Equipment:   Intra-op Plan:   Post-operative Plan:   Informed Consent: I have reviewed the patients History and Physical, chart, labs and discussed the procedure including the risks, benefits and alternatives for the proposed anesthesia with the patient or authorized representative who has indicated his/her understanding and acceptance.     Plan Discussed with: CRNA  Anesthesia Plan Comments:         Anesthesia Quick Evaluation

## 2017-03-10 NOTE — Progress Notes (Signed)
[name], MD Physician Signed Physical Medicine and Rehabilitation  Consult Note Date of Service: 03/05/2017 8:44 AM  Related encounter: Admission (Discharged) from 03/03/2017 in [place] Collapse All   [] Hide copied text [] Hover for attribution information      Physical Medicine and Rehabilitation Consult   Reason for Consult: Cerebellar stroke with hemorraghic conversion Referring Physician: Dr. Leonel Ramsay.    HPI: Ashley Chan is a 83 y.o. left handed female who was admitted to OSH with reports of dizziness, malaise and coffec ground emesis. CT head unremarkable for acute process.  Unable to get MRI brain due to the weekend. 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 She was transferred to [facility] for treatment and care. She was started medications for tighter BP control and required fluid boluses due to poor UOP. Repeat CT head reviewed, stable midline shift.  Work up ongoing and PT evaluation done yesterday revealing balance deficits with ataxia, gaze instability and nausea affecting mobility. CIR recommended by MD and rehab team.    She lives with 2 daughters (one is disabled and uses walker). She furniture walked and has had to use a SPC for the past month due to right hip pain. Independent for ADLs. Children manage home.   Review of Systems  HENT: Positive for hearing loss. Negative for tinnitus.   Eyes: Negative for blurred vision and double vision.  Respiratory: Negative for cough and shortness of breath.   Cardiovascular: Negative for chest pain and palpitations.  Gastrointestinal: Positive for constipation. Negative for heartburn and nausea.  Genitourinary: Negative for dysuria and urgency.  Musculoskeletal: Positive for back pain and joint pain (right hip pain).  Skin: Negative for rash.  Neurological: Positive  for dizziness, weakness and headaches.  Psychiatric/Behavioral: Negative for depression and suicidal ideas. Insomnia: due to anxiety/worries.   All other systems reviewed and are negative.         Past Medical History:  Diagnosis Date  . Hypertension          Past Surgical History:  Procedure Laterality Date  . BACK SURGERY  2011  . CATARACT EXTRACTION, BILATERAL  1999         Family History  Problem Relation Age of Onset  . Stroke Mother 40  . Lung disease Father        "black lung"      Social History:  Widowed. Lives with family. 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  She reports that she smoked for less than a year. Her smoking use included Cigarettes. She started smoking about 69 years ago. She has never used smokeless tobacco. She reports that she does not drink alcohol or use drugs.         Allergies  Allergen Reactions  . Benzyl Alcohol     Reaction (?)  . Ethanol     Reaction (?)  . Lidocaine     Reaction (?)  . Propranolol     Reaction (?)  . Sulfa Antibiotics     Reaction (?)          Medications Prior to Admission  Medication Sig Dispense Refill  . amLODipine (NORVASC) 5 MG tablet Take 5 mg by mouth daily.  6  . aspirin EC 81 MG tablet Take 81 mg by mouth daily with breakfast.    . clonazePAM (KLONOPIN) 0.5 MG tablet  Take 0.5 mg by mouth daily as needed for anxiety.    . diclofenac (VOLTAREN) 75 MG EC tablet Take 75 mg by mouth 2 (two) times daily. WITH FOOD OR MILK  5  . diclofenac sodium (VOLTAREN) 1 % GEL Apply 1 g topically 2 (two) times daily as needed (for pain or inflammation). TO AFFECTED AREAS  2  . fluticasone (FLONASE) 50 MCG/ACT nasal spray Place 2 sprays into both nostrils daily as needed for allergies or rhinitis.   3  . hydrochlorothiazide (HYDRODIURIL) 12.5 MG tablet Take 12.5 mg by mouth daily.  3  . labetalol (NORMODYNE) 300 MG tablet Take 300 mg by mouth 2 (two)  times daily.    Marland Kitchen PROAIR HFA 108 (90 Base) MCG/ACT inhaler Inhale 1 puff into the lungs every 6 (six) hours as needed for shortness of breath or wheezing.  3  . traMADol (ULTRAM) 50 MG tablet Take 50 mg by mouth 2 (two) times daily as needed for pain.  0  . Vitamin D, Ergocalciferol, (DRISDOL) 50000 units CAPS capsule Take 50,000 Units by mouth every 7 (seven) days.  6    Home: Home Living Family/patient expects to be discharged to:: Private residence Living Arrangements: Children (with 2 daughters) Available Help at Discharge: Family, Available PRN/intermittently Type of Home: House Home Access: Ramped entrance [place]: One level Bathroom Shower/Tub: Tub/shower unit Home Equipment: Civil engineer, contracting, Environmental consultant - 2 wheels, [place] - single point  Functional History: Prior Function Level of Independence: Independent with assistive device(s) Comments: Pt uses SPC as needed. Drives.  Functional Status:  Mobility: Bed Mobility Overal bed mobility: Needs Assistance Bed Mobility: Supine to Sit Supine to sit: Mod assist, HOB elevated General bed mobility comments: Assist to elevate trunk to EOB. Truncal ataxia and right lateral lean.  Transfers Overall transfer level: Needs assistance Equipment used: Rolling walker (2 wheeled) Transfers: Sit to/from Stand, [employer] Transfers Sit to Stand: Mod assist, +2 safety/equipment Stand pivot transfers: Mod assist, +2 safety/equipment General transfer comment: Assist to power to standing. Able to take a few steps to chair with Mod A for balance, LLE ataxia with advancement. + dizziness and nausea. Ambulation/Gait General Gait Details: Deferred due to safety/nausea.  ADL:  Cognition: Cognition Overall Cognitive Status: Within Functional Limits for tasks assessed Orientation Level: Oriented X4 Cognition Arousal/Alertness: Awake/alert Behavior During Therapy: WFL for tasks assessed/performed Overall Cognitive Status: Within Functional  Limits for tasks assessed  Blood pressure (!) 180/75, pulse 95, temperature 99.6 F (37.6 C), temperature source Oral, resp. rate 16, height 5\' 7"  (1.702 m), weight 72.3 kg (159 lb 4.8 oz), SpO2 98 %. Physical Exam  Vitals reviewed. Constitutional: She is oriented to person, place, and time. She appears well-developed and well-nourished.  HENT:  Head: Normocephalic and atraumatic.  Eyes: EOM are normal. Right eye exhibits no discharge. Left eye exhibits no discharge.  Neck: Normal range of motion. Neck supple.  Cardiovascular: Normal rate and regular rhythm.   Respiratory: Effort normal and breath sounds normal.  GI: Soft. Bowel sounds are normal.  Musculoskeletal: She exhibits edema. She exhibits no tenderness.  Neurological: She is alert and oriented to person, place, and time.  Motor: 5/5 throughout RUE: Ataxia LUE: Ataxia and dysmetria (worse than right) Sensation intact to light touch  Skin: Skin is warm and dry.  Psychiatric: She has a normal mood and affect. Her behavior is normal. Thought content normal.    Lab Results Last 24 Hours  No results found for this or any previous visit (  from the past 24 hour(s)).    Imaging Results (Last 48 hours)  Ct Angio Head W Or Wo Contrast  Result Date: 03/03/2017 CLINICAL DATA:  Intracranial hemorrhage for follow-up. EXAM: CT ANGIOGRAPHY HEAD AND NECK TECHNIQUE: Multidetector CT imaging of the head and neck was performed using the standard protocol during bolus administration of intravenous contrast. Multiplanar CT image reconstructions and MIPs were obtained to evaluate the vascular anatomy. Carotid stenosis measurements (when applicable) are obtained utilizing NASCET criteria, using the distal internal carotid diameter as the denominator. CONTRAST:  50 cc Isovue 370 COMPARISON:  03/03/2017 MRI of the head and 03/01/2017 CT of the head. FINDINGS: CT HEAD FINDINGS Brain: Left superior cerebellar hematoma, surrounding vasogenic edema, and  local mass effect are stable from the prior MRI given differences in technique. There is superior transtentorial herniation with partial effacement of the quadrigeminal plate cistern as well as effacement of fourth ventricle that is stable. Ventricle size is unchanged. No evidence for new acute intracranial hemorrhage or infarct. Stable background of chronic microvascular ischemic changes and parenchymal volume loss of the brain. Vascular: As below. Skull: Normal. Negative for fracture or focal lesion. Sinuses: Imaged portions are clear. Orbits: No acute finding. Review of the MIP images confirms the above findings CTA NECK FINDINGS Aortic arch: Bovine arch.  Mild calcific atherosclerosis. Right carotid system: No evidence of dissection, stenosis (50% or greater) or occlusion. Moderate calcified plaque of the carotid bifurcation with 30% proximal ICA stenosis. Left carotid system: No evidence for dissection or occlusion. Severe calcific atherosclerosis of the left carotid bifurcation moderate 50% distal common carotid and severe 70% proximal ICA stenosis. Additionally, there is severe 70% stenosis of the external carotid artery origin. Vertebral arteries: Right dominant. No evidence of dissection, stenosis (50% or greater) or occlusion. Skeleton: Multilevel cervical spondylosis with predominantly discogenic degenerative disease. From the C4-5 to C5-6 levels there is ossification of the posterior longitudinal ligament and the ligamentum flavum with severe canal stenosis. Other neck: Thyroid nodules measuring up to 15 mm in the left lobe. Upper chest: Negative. Review of the MIP images confirms the above findings CTA HEAD FINDINGS Anterior circulation: No proximal occlusion, aneurysm, or vascular malformation. Extensive calcific atherosclerosis of carotid siphons with mild right and moderate to severe left paraclinoid stenosis. Posterior circulation: No significant stenosis, proximal occlusion, aneurysm, or vascular  malformation. Bilateral PICA and AICA are patent. A small right AICA is identified. No left AICA is identified, possibly diminutive or occluded. Venous sinuses: As permitted by contrast timing, patent. Anatomic variants: Large right A1 and anterior communicating artery with no appreciable left A1, normal variant. Patent right posterior communicating artery. Diminutive left posterior communicating artery. Delayed phase: No abnormal intracranial enhancement. Review of the MIP images confirms the above findings IMPRESSION: CT head: 1. Left superior cerebellar hemorrhage, associated vasogenic edema, and mass effect are stable in comparison with prior MRI given differences in technique. 2. Stable mild superior transtentorial herniation, partial effacement of quadrigeminal plate cistern, and of fourth ventricle. No hydrocephalus. 3. No new acute intracranial abnormality. CTA neck: 1. Patent carotid and vertebral arteries of the neck. 2. Moderate right carotid bifurcation calcified plaque with mild 30% proximal ICA stenosis. 3. Severe left carotid bifurcation calcified plaque with severe 70% proximal ICA stenosis. 4. 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 5. Thyroid nodules measuring up to 15 mm. Further evaluation with thyroid ultrasound recommended. CTA head: 1. Patent circle of Willis. No  large vessel occlusion, aneurysm, or vascular malformation identified. 2. Calcific plaque of the carotid siphons with mild right and moderate to severe left paraclinoid stenosis. 3. No appreciable left AICA, possibly diminutive or occluded. These results will be called to the ordering clinician or representative by the Radiologist Assistant, and communication documented in the PACS or zVision Dashboard. Electronically Signed   By: Kristine Garbe M.D.   On: 03/03/2017 18:52   Ct Head Wo Contrast  Result Date:  03/05/2017 CLINICAL DATA:  Stroke, follow-up evaluation. EXAM: CT HEAD WITHOUT CONTRAST TECHNIQUE: Contiguous axial images were obtained from the base of the skull through the vertex without intravenous contrast. COMPARISON:  CT HEAD March 04, 2017 FINDINGS: BRAIN: Stable evolving LEFT cerebellar 2.7 x 2.7 cm hematoma. Similar surrounding low-density vasogenic edema partially effacing the fourth ventricle. No hydrocephalus. Patchy to confluent supratentorial white matter hypodensities. No supratentorial midline shift. No acute large vascular territory infarct. No abnormal extra-axial fluid collections. Basal cisterns are patent. VASCULAR: Moderate calcific atherosclerosis of the carotid siphons. SKULL: No skull fracture. 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 No significant scalp soft tissue swelling. SINUSES/ORBITS: Trace LEFT maxillary sinus mucosal thickening. Mastoid air cells are well aerated. The included ocular globes and orbital contents are non-suspicious. OTHER: None. IMPRESSION: 1. Evolving LEFT cerebellar 2.7 x 2.7 cm intraparenchymal hematoma. Partially effaced fourth ventricle without hydrocephalus. 2. Moderate to severe chronic small vessel ischemic disease. Electronically Signed   By: Elon Alas M.D.   On: 03/05/2017 04:52   Ct Head Wo Contrast  Result Date: 03/04/2017 CLINICAL DATA:  Follow-up exam for cerebral hemorrhage. EXAM: CT HEAD WITHOUT CONTRAST TECHNIQUE: Contiguous axial images were obtained from the base of the skull through the vertex without intravenous contrast. COMPARISON:  Prior CT from 03/03/2017. FINDINGS: Brain: Left superior cerebellar hematoma is slightly increased in size measuring 2.7 x 2.6 x 1.6 cm, previously 2.4 x 2.2 x 1.5 cm). Surrounding low-density vasogenic edema is slightly worsened. Associated mass effect on the adjacent fourth ventricle is also slightly worsened, although the fourth ventricle remains patent. Supra tentorial herniation with  partial effacement of the left quadrigeminal plate cistern again noted, similar. Ventricular size is unchanged without hydrocephalus. No other new acute intracranial hemorrhage. No acute large vessel territory infarct. No midline shift. No extra-axial fluid collection. Underlying atrophy with chronic small vessel ischemic disease again noted. Vascular: No hyperdense vessel.  Intracranial atherosclerosis noted. Skull: Scalp soft tissues and calvarium within normal limits. Sinuses/Orbits: Globes and orbital soft tissues normal. Mild mucosal thickening within the left maxillary sinus. Paranasal sinuses are otherwise clear. Torus palatini noted. IMPRESSION: 1. Slight interval increase in size of left superior cerebellar hematoma as above with slightly worsened associated vasogenic edema. Associated mass effect on the adjacent fourth ventricle slightly worsened, although remains patent at this time. No hydrocephalus. 2. No other new acute intracranial process. Electronically Signed   By: Jeannine Boga M.D.   On: 03/04/2017 07:23   Ct Angio Neck W Or Wo Contrast  Result Date: 03/03/2017 CLINICAL DATA:  Intracranial hemorrhage for follow-up. EXAM: CT ANGIOGRAPHY HEAD AND NECK TECHNIQUE: Multidetector CT imaging of the head and neck was performed using the standard protocol during bolus administration of intravenous contrast. Multiplanar CT image reconstructions and MIPs were obtained to evaluate the vascular anatomy. Carotid stenosis measurements (when applicable) are obtained utilizing NASCET criteria, using the distal internal carotid diameter as the denominator. CONTRAST:  50 cc Isovue 370 COMPARISON:  03/03/2017 MRI of the head and 03/01/2017 CT of the head.  FINDINGS: CT HEAD FINDINGS Brain: Left superior cerebellar hematoma, surrounding vasogenic edema, and local mass effect are stable from the prior MRI given differences in technique. There is superior transtentorial herniation with partial effacement of  the quadrigeminal plate cistern as well as effacement of fourth ventricle that is stable. Ventricle size is unchanged. No evidence for new acute intracranial hemorrhage or infarct. Stable background of chronic microvascular ischemic changes and parenchymal volume loss of the brain. Vascular: As below. Skull: Normal. Negative for fracture or focal lesion. Sinuses: Imaged portions are clear. Orbits: No acute finding. Review of the MIP images confirms the above findings CTA NECK FINDINGS Aortic arch: Bovine arch.  Mild calcific atherosclerosis. Right carotid system: No evidence of dissection, stenosis (50% or greater) or occlusion. Moderate calcified plaque of the carotid bifurcation with 30% proximal ICA stenosis. Left carotid system: No evidence for dissection or occlusion. Severe calcific atherosclerosis of the left carotid bifurcation moderate 50% distal common carotid and severe 70% proximal ICA stenosis. Additionally, there is severe 70% stenosis of the external carotid artery origin. Vertebral arteries: Right dominant. No evidence of dissection, stenosis (50% or greater) or occlusion. Skeleton: Multilevel cervical spondylosis with predominantly discogenic degenerative disease. From the C4-5 to C5-6 levels there is ossification of the posterior longitudinal ligament and the ligamentum flavum with severe canal stenosis. Other neck: Thyroid nodules measuring up to 15 mm in the left lobe. Upper chest: Negative. Review of the MIP images confirms the above findings CTA HEAD FINDINGS Anterior circulation: No proximal occlusion, aneurysm, or vascular malformation. Extensive calcific atherosclerosis of carotid siphons with mild right and moderate to severe left paraclinoid stenosis. Posterior circulation: No significant stenosis, proximal occlusion, aneurysm, or vascular malformation. Bilateral PICA and AICA are patent. A small right AICA is identified. No left AICA is identified, possibly diminutive or occluded. Venous  sinuses: As permitted by contrast timing, patent. Anatomic variants: Large right A1 and anterior communicating artery with no appreciable left A1, normal variant. Patent right posterior communicating artery. Diminutive left posterior communicating artery. Delayed phase: No abnormal intracranial enhancement. Review of the MIP images confirms the above findings IMPRESSION: CT head: 1. Left superior cerebellar hemorrhage, associated vasogenic edema, and mass effect are stable in comparison with prior MRI given differences in technique. 2. Stable mild superior transtentorial herniation, partial effacement of quadrigeminal plate cistern, and of fourth ventricle. No hydrocephalus. 3. No new acute intracranial abnormality. CTA neck: 1. Patent carotid and vertebral arteries of the neck. 2. Moderate right carotid bifurcation calcified plaque with mild 30% proximal ICA stenosis. 3. Severe left carotid bifurcation calcified plaque with severe 70% proximal ICA stenosis. 4. 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 5. Thyroid nodules measuring up to 15 mm. Further evaluation with thyroid ultrasound recommended. CTA head: 1. Patent circle of Willis. No large vessel occlusion, aneurysm, or vascular malformation identified. 2. Calcific plaque of the carotid siphons with mild right and moderate to severe left paraclinoid stenosis. 3. No appreciable left AICA, possibly diminutive or occluded. These results will be called to the ordering clinician or representative by the Radiologist Assistant, and communication documented in the PACS or zVision Dashboard. Electronically Signed   By: Kristine Garbe M.D.   On: 03/03/2017 18:52   Dg Chest Port 1 View  Result Date: 03/03/2017 CLINICAL DATA:  Vomiting and headache.  Left cerebellar hemorrhage. EXAM: PORTABLE CHEST 1 VIEW COMPARISON:  03/01/2017 FINDINGS: The heart size  and mediastinal contours are  within normal limits. Improved aeration of the left lower lobe with mild atelectasis remaining. Minimal atelectasis at the right lung base. There is no evidence of pulmonary edema, consolidation, pneumothorax, nodule or pleural fluid. The visualized skeletal structures are unremarkable. IMPRESSION: Improved aeration of the left lower lung. Atelectasis remains at both lung bases. Electronically Signed   By: Aletta Edouard M.D.   On: 03/03/2017 20:18     Assessment/Plan: Diagnosis: Left cerebellar infarct with hemorrhagic conversion and midline shift Labs and images independently reviewed.  Records reviewed and summated above. Stroke: Continue secondary stroke prophylaxis and Risk Factor Modification listed below:   Blood Pressure Management:  Continue current medication with prn's with permisive HTN per primary team Statin Agent:   Diabetes management:    1. Does the need for close, 24 hr/day medical supervision in concert with the patient's rehab needs make it unreasonable for this patient to be served in a less intensive setting? Yes 2. Co-Morbidities requiring supervision/potential complications: HTN (monitor and provide prns in accordance with increased physical exertion and pain, wean IV meds as appropriate), chronic back pain (Biofeedback training with therapies to help reduce reliance on opiate pain medications, monitor pain control during therapies, and sedation at rest and titrate to maximum efficacy to ensure participation and gains in therapies), anxiety (ensure anxiety and resulting apprehension do not limit functional progress; consider prn medications if warranted) 3. Due to safety, disease management and patient education, does the patient require 24 hr/day rehab nursing? Yes 4. Does the patient require coordinated care of a physician, rehab nurse, PT (1-2 hrs/day, 5 days/week) and OT (1-2 hrs/day, 5 days/week) to address physical and functional deficits  in the context of the above medical diagnosis(es)? Yes Addressing deficits in the following areas: balance, endurance, locomotion, transferring, bathing, dressing, toileting and psychosocial support 5. Can the patient actively participate in an intensive therapy program of at least 3 hrs of therapy per day at least 5 days per week? Yes 6. The potential for patient to make measurable gains while on inpatient rehab is excellent 7. Anticipated functional outcomes upon discharge from inpatient rehab are supervision and min assist  with PT, supervision and min assist with OT, n/a with SLP. 8. Estimated rehab length of stay to reach the above functional goals is: 14-18 days. 9. Anticipated D/C setting: Other 10. Anticipated post D/C treatments: SNF 11. Overall Rehab/Functional Prognosis: good  RECOMMENDATIONS: This patient's condition is appropriate for continued rehabilitative care in the following setting: CIR Patient has agreed to participate in recommended program. Yes Note that insurance prior authorization may be required for reimbursement for recommended care.  Comment: Rehab Admissions Coordinator to follow up.  Delice Lesch, MD, Mellody Drown [place], [place] 03/05/2017    Revision History                        Routing History

## 2017-03-10 NOTE — Anesthesia Procedure Notes (Signed)
Procedure Name: MAC Date/Time: 03/10/2017 11:08 AM Performed by: Rush Farmer E Pre-anesthesia Checklist: Patient identified, Emergency Drugs available, Suction available and Patient being monitored Patient Re-evaluated:Patient Re-evaluated prior to induction Oxygen Delivery Method: Simple face mask Preoxygenation: Pre-oxygenation with 100% oxygen Induction Type: IV induction Placement Confirmation: positive ETCO2 Dental Injury: Teeth and Oropharynx as per pre-operative assessment

## 2017-03-10 NOTE — Care Management Note (Signed)
Case Management Note  Patient Details  Name: Ashley Chan MRN: [id number] Date of Birth: 10/21/1928  Subjective/Objective:                    Action/Plan: Pt discharged to CIR. No further needs per CM.   Expected Discharge Date:  03/08/17               Expected Discharge Plan:  [place]  In-House Referral:     Discharge planning Services  CM Consult  Post Acute Care Choice:    Choice offered to:     DME Arranged:    DME Agency:     HH Arranged:    HH Agency:     Status of Service:  Completed, signed off  If discussed at [employer], dates discussed:    Additional Comments:  Pollie Friar, RN 03/10/2017, 4:47 PM

## 2017-03-10 NOTE — Progress Notes (Signed)
[name], MD Physician Signed Physical Medicine and Rehabilitation  PMR Pre-admission Date of Service: 03/07/2017 12:01 PM  Related encounter: Admission (Discharged) from 03/03/2017 in [place]       [] Hide copied text PMR Admission Coordinator Pre-Admission Assessment  Patient: Ashley Chan is an 83 y.o., female MRN: [id number] DOB: 10/22/1928 Height: 5\' 7"  (170.2 cm) Weight: 72.3 kg (159 lb 4.8 oz)                                                                                                                                                  Insurance Information HMO:     PPO:      PCP:      IPA:      80/20:      OTHER:  PRIMARY: Medicare A & B      Policy#: [id number]      Subscriber: Self CM Name:       Phone#:      Fax#:  Pre-Cert#: eligible       Employer: [employer] Benefits:  Phone #: online      Name: Passport One Eff. Date: 02/27/96     Deduct: $1340      Out of Pocket Max: None      Life Max: None CIR: 100%      SNF: 100% days 1-20; 80% days 21-100 Outpatient: 80%     Co-Pay: 20% Home Health: 100%      Co-Pay: none DME: 80%     Co-Pay: 20% Providers: Patient's choice   SECONDARY: Oneal Grout       Policy#: [id number]      Subscriber: Self CM Name:       Phone#:      Fax#:  Pre-Cert#:       Employer:  Benefits:  Phone #: [phone]     Name:  Eff. Date:      Deduct:       Out of Pocket Max:       Life Max:  CIR:       SNF:  Outpatient:      Co-Pay:  Home Health:       Co-Pay:  DME:      Co-Pay:   TERTIARY: Generic Commercial      Policy#: [id number]      Subscriber:  CM Name:       Phone#:      Fax#:  Pre-Cert#:       Employer:  Benefits:  Phone #: [phone]     Name:  Eff. Date:      Deduct:       Out of Pocket Max:       Life Max:  CIR:       SNF:  Outpatient:      Co-Pay:  Home Health:  Co-Pay:  DME:      Co-Pay:   Medicaid Application Date:       Case Manager:  Disability Application Date:        Case Worker:   Emergency Contact Information        Contact Information    Name Relation Home Work [place] Daughter [phone]     Ashley Chan Daughter   [phone]     Current Medical History  Patient Admitting Diagnosis: Left cerebellar infarct with hemorrhagic conversion and midline shift.   History of Present Illness: Ashley Chan an 83 y.o.left handed female who was admitted to OSH with reports of dizziness, malaise and coffec ground emesis. CT head unremarkable for acute process. Unable to get MRI brain due to the weekend. 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 She was transferred to [facility] for treatment and care. She was started medications for tighter BP control and required fluid boluses due to poor UOP. Repeat CT head reviewed, stable midline shift. 2 D echo showed EF 60-655 with grade 2 diastolic dysfunction, moderate mitral valve calcification and moderate pulmonary HTN. CTA head/neck showed severe left carotid bifurcation calcified plaque with severe 70% proximal L-ICA stenosis as well as thyroid nodules up to 15 mm.   She did have bradycardic episode question due to vasovagal event as well as reports of purple/red stools. Stool guaiacs heme positive and patient developed coffee ground emesis. Dr. Carlean Purl consulted for input as patient with history of gastric ulcers. EGD done yesterday and showed duodenitis but rectal exam with hard stool and dark blood on digital exam. She underwent colonoscopy today revealing ten mm ulcer in sigmoid colon with erythema and edema--bx taken, internal hems, two polyps--resected and mild diverticulosis. GI recommended maintaining adequate hydration, avoiding NSAIDs x 2 weeks and suspected that ischemic colitis led to ulcer and heme positive stools.   Therapy ongoing and patient with limitation in mobility and  ability to carry out ADL tasks. CIR recommended by MD and rehab team for follow up therapy. Patient cleared for admission today.  NIH Total: 1  Past Medical History      Past Medical History:  Diagnosis Date  . Anxiety   . Ataxia, post-stroke   . Back pain   . Chronic midline low back pain without sciatica   . CVA (cerebral vascular accident) ([place]) 03/03/2017  . Hypertension   . Right hip pain     Family History  family history includes Lung disease in her father; Stroke (age of onset: 72) in her mother.  Prior Rehab/Hospitalizations:  Has the patient had major surgery during 100 days prior to admission? No  Current Medications   Current Facility-Administered Medications:  .  acetaminophen (TYLENOL) tablet 650 mg, 650 mg, Oral, Q6H PRN, Marliss Coots, PA-C, 650 mg at 03/08/17 2229 .  amLODipine (NORVASC) tablet 10 mg, 10 mg, Oral, Daily, Patteson, Samuel A, NP, 10 mg at 03/09/17 1135 .  hydrochlorothiazide (HYDRODIURIL) tablet 25 mg, 25 mg, Oral, Daily, Patteson, Samuel A, NP, 25 mg at 03/09/17 1132 .  labetalol (NORMODYNE) tablet 100 mg, 100 mg, Oral, BID, Rinehuls, David L, PA-C, 100 mg at 03/09/17 2302 .  ondansetron (ZOFRAN) injection 4 mg, 4 mg, Intravenous, Q4H PRN, Greta Doom, MD, 4 mg at 03/06/17 0805 .  pantoprazole (PROTONIX) EC tablet 40 mg, 40 mg, Oral, Daily, Rinehuls, David L, PA-C, 40 mg at 03/09/17 1132 .  potassium chloride (KLOR-CON) packet 20 mEq, 20 mEq, Oral, BID, Rinehuls, Early Chars, PA-C, 20 mEq at 03/09/17 2303 .  senna-docusate (Senokot-S) tablet 1 tablet, 1 tablet, Oral, QHS PRN, Marliss Coots, PA-C, 1 tablet at 03/05/17 2109 .  Vitamin D (Ergocalciferol) (DRISDOL) capsule 50,000 Units, 50,000 Units, Oral, Q7 days, Patteson, Arlan Organ, NP, 50,000 Units at 03/08/17 4098  Patients Current Diet: DIET SOFT Room service appropriate? Yes; Fluid consistency: Thin  Precautions / Restrictions Precautions Precautions: Fall Precaution  Comments: SBP <140 Restrictions Weight Bearing Restrictions: No   Has the patient had 2 or more falls or a fall with injury in the past year?No  Prior Activity Level Community (5-7x/wk): Prior to admission patient was fully indeopendent and out in the community daily.  She enjoys shopping, going to church, and being with her family.    Home Assistive Devices / Equipment Home Assistive Devices/Equipment: Cane (specify quad or straight), Shower chair with back, CBG Meter Home Equipment: Shower seat, Environmental consultant - 2 wheels, Cane - single point  Prior Device Use: Indicate devices/aids used by the patient prior to current illness, exacerbation or injury? Single point cane   Prior Functional Level Prior Function Level of Independence: Independent with assistive device(s) Comments: Pt uses SPC as needed. Drives.   Self Care: Did the patient need help bathing, dressing, using the toilet or eating? Independent  Indoor Mobility: Did the patient need assistance with walking from room to room (with or without device)? Independent  Stairs: Did the patient need assistance with internal or external stairs (with or without device)? Independent  Functional Cognition: Did the patient need help planning regular tasks such as shopping or remembering to take medications? Independent  Current Functional Level Cognition  Arousal/Alertness: Awake/alert Overall Cognitive Status: Impaired/Different from baseline Current Attention Level: Sustained Orientation Level: Oriented X4 Following Commands: Follows one step commands inconsistently, Follows one step commands with increased time General Comments: patient having difficulty sequencing basic tasks such as hygiene performance or following commands from therapy team. Some confusion with directional instruction and delays with processing  Attention: Sustained Sustained Attention: Impaired Sustained Attention Impairment: Verbal complex Memory:  Impaired Memory Impairment: Storage deficit, Retrieval deficit, Decreased recall of new information Awareness: Impaired Awareness Impairment: Intellectual impairment, Emergent impairment, Anticipatory impairment Problem Solving: Impaired Problem Solving Impairment: Functional basic Safety/Judgment: Impaired    Extremity Assessment (includes Sensation/Coordination)  Upper Extremity Assessment: LUE deficits/detail LUE Deficits / Details: Impaired coordination; ataxia present. Strength and ROM overall WFL. LUE Sensation: decreased proprioception LUE Coordination: decreased gross motor, decreased fine motor  Lower Extremity Assessment: Defer to PT evaluation LLE Deficits / Details: Ataxia present LLE. LLE Sensation: decreased proprioception LLE Coordination: decreased fine motor, decreased gross motor    ADLs  Overall ADL's : Needs assistance/impaired Eating/Feeding: Minimal assistance, Sitting Grooming: Moderate assistance, Standing, Wash/dry hands Grooming Details (indicate cue type and reason): mod assist for standing balance. cues for sequencing Upper Body Bathing: Moderate assistance, Sitting Lower Body Bathing: Maximal assistance, Sit to/from stand Upper Body Dressing : Moderate assistance, Sitting Lower Body Dressing: Maximal assistance, Sit to/from stand Toilet Transfer: Maximal assistance, +2 for physical assistance, Ambulation, Comfort height toilet, Grab bars Toileting- Clothing Manipulation and Hygiene: Maximal assistance, Sit to/from stand Functional mobility during ADLs: Maximal assistance, +2 for physical assistance    Mobility  Overal bed mobility: Needs Assistance Bed Mobility: Supine to Sit Supine to sit: HOB elevated, Min assist General bed mobility comments: Assist to elevate trunk to EOB. Truncal ataxia but mild right lateral  lean noted    Transfers  Overall transfer level: Needs assistance Equipment used: Rolling walker (2 wheeled) Transfers: Sit  to/from Stand, Stand Pivot Transfers Sit to Stand: +2 safety/equipment, Min assist Stand pivot transfers: +2 safety/equipment, Min assist General transfer comment: Assist to power to standing. Able to take a few steps to chair with Min A for balance, LLE ataxia with advancement. + dizziness and nausea.    Ambulation / Gait / Stairs / Wheelchair Mobility  Ambulation/Gait Ambulation/Gait assistance: Mod assist, Max assist, +2 safety/equipment Ambulation Distance (Feet): 20 Feet Assistive device: 2 person hand held assist, Rolling walker (2 wheeled), 1 person hand held assist Gait Pattern/deviations: Step-to pattern, Ataxic, Narrow base of support, Scissoring General Gait Details: Attempted RW however pt with increased difficulty sequencing. Increased LOB and unsafe pushing forward of walker. Moved to the railing in which pt had therapist on the R side, railing on L and a close chair follow. Pt was able to show improved sequencing and stronger steps.  Gait velocity: decreased Gait velocity interpretation: Below normal speed for age/gender    Posture / Balance Dynamic Sitting Balance Sitting balance - Comments: Able to sit EOB with continued truncal ataxia. Balance Overall balance assessment: Needs assistance Sitting-balance support: Feet supported, No upper extremity supported Sitting balance-Leahy Scale: Fair Sitting balance - Comments: Able to sit EOB with continued truncal ataxia. Postural control: Right lateral lean Standing balance support: During functional activity, Bilateral upper extremity supported Standing balance-Leahy Scale: Poor Standing balance comment: Reliant on UEs and external support for balance due to ataxia.    Special needs/care consideration BiPAP/CPAP: No CPM: No Continuous Drip IV: No Dialysis: No         Life Vest: No Oxygen: No Special Bed: No Trach Size: No Wound Vac (area): No       Skin: WDL                               Bowel mgmt: Continent  03/07/17 Bladder mgmt: Continent, some urgency which patient used pads for prior to admission  Diabetic mgmt: No, [id number]     Previous Home Environment Living Arrangements: Children  Lives With: Daughter (2 daughters) Available Help at Discharge: Family Type of Home: House Home Layout: One level Home Access: Ramped entrance Bathroom Shower/Tub: Chiropodist: Handicapped height [place]: No  Discharge Living Setting Plans for Discharge Living Setting: Patient's home, Lives with (comment) (2 daughters, 1 works and 1 is disabled with a walker ) Type of Home at Discharge: House Discharge Home Layout: One level Discharge Home Access: [place] entrance Discharge Bathroom Shower/Tub: Tub/shower unit (has a shower seat ) Discharge Bathroom Toilet: Handicapped height Discharge Bathroom Accessibility: Yes How Accessible: Accessible via walker Does the patient have any problems obtaining your medications?: No  Social/Family/Support Systems Patient Roles: Parent Contact Information: Madilyn Fireman [phone] Anticipated Caregiver: Family  Anticipated Caregiver's Contact Information: see above  Ability/Limitations of Caregiver: Bonnita Nasuti can provide Supervision assist only; other daughter works and can provide PRN Min A Caregiver Availability: Other (Comment) (24/7 Supervision; Intermittent/evenings and weekends Min A) Discharge Plan Discussed with Primary Caregiver: Yes Is Caregiver In Agreement with Plan?: Yes Does Caregiver/Family have Issues with Lodging/Transportation while Pt is in Rehab?: No  Goals/Additional Needs Patient/Family Goal for Rehab: OT/PT Supervision-Min A; SLP Mod I  Expected length of stay: 14-18 days  Cultural Considerations: None Dietary Needs: Heart Healthy Restrictions  Equipment Needs: TBD Special Service  Needs: None Additional Information: N/A Pt/Family Agrees to Admission and willing to participate: Yes Program  Orientation Provided & Reviewed with Pt/Caregiver Including Roles  & Responsibilities: Yes Additional Information Needs: N/A Information Needs to be Provided By: N/A  Barriers to Discharge: Other (comments) (Potential decreased caregiver support based on pt. progress)  Decrease burden of Care through IP rehab admission: No  Possible need for SNF placement upon discharge: Potential if vertigo and ataxia are prolonged and impact ability to achieve Supervision-Min A goals   Patient Condition: This patient's medical and functional status has changed since the consult dated: 03/05/17 in which the Rehabilitation Physician determined and documented that the patient's condition is appropriate for intensive rehabilitative care in an inpatient rehabilitation facility. See "History of Present Illness" (above) for medical update. Functional changes are: Mod A +2 for transfers, Mod-Max A +2 for gait. Patient's medical and functional status update has been discussed with the Rehabilitation physician and patient remains appropriate for inpatient rehabilitation. Will admit to inpatient rehab today.  Preadmission Screen Completed By:  Gunnar Fusi, 03/10/2017 1:46 PM ______________________________________________________________________   Discussed status with Dr. Posey Pronto on 03/10/17 at 1455 and received telephone approval for admission today.  Admission Coordinator:  Gunnar Fusi, time 1455/Date 03/10/17       Revision History

## 2017-03-10 NOTE — Interval H&P Note (Signed)
History and Physical Interval Note:  03/10/2017 10:58 AM  Ashley Chan  has presented today for surgery, with the diagnosis of heme + stool  The various methods of treatment have been discussed with the patient and family. After consideration of risks, benefits and other options for treatment, the patient has consented to  Procedure(s): COLONOSCOPY WITH PROPOFOL (N/A) as a surgical intervention .  The patient's history has been reviewed, patient examined, no change in status, stable for surgery.  I have reviewed the patient's chart and labs.  Questions were answered to the patient's satisfaction.     Pricilla Riffle. Fuller Plan

## 2017-03-10 NOTE — H&P (Signed)
Physical Medicine and Rehabilitation Admission H&P    CC: Cerebellar stroke with dizziness and difficulty with mobility and ADL tasks.    HPI: Ashley Chan is a 83 y.o. left handed female who was admitted to OSH with reports of dizziness, malaise and coffec ground emesis. History taken from chart review, patient, and family.  CT head unremarkable for acute process.  Unable to get MRI brain due to the weekend. 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 She was transferred to [facility] for treatment and care. She was started medications for tighter BP control and required fluid boluses due to poor UOP. Repeat CT head 8/8 reviewed, stable midline shift. 2 D echo showed EF 60-655 with grade 2 diastolic dysfunction, moderate mitral valve calcification and moderate pulmonary HTN.  CTA head/neck showed severe left carotid bifurcation calcified plaque with severe 70% proximal L-ICA stenosis as well as thyroid nodules up to 15 mm. Repeat head CT 8/13 reviewed, with some improvement.   She did have bradycardic episode question due to vasovagal event as well as reports of purple/red stools.  Stool guaiacs heme positive and patient developed coffee ground emesis. Dr. Carlean Purl consulted for input as patient with history of gastric ulcers. EGD  done yesterday and showed duodenitis but rectal exam with hard stool and dark blood on digital exam.  She underwent colonoscopy today revealing ten mm ulcer in sigmoid colon with erythema and edema--bx taken, internal hems, two polyps--resected and mild diverticulosis.  GI recommended maintaining adequate hydration, avoiding NSAIDs x 2 weeks and suspected that ischemic colitis led to ulcer and heme positive stools. MRI C-spine reviewed, significant for degenerative changes and myelomalacia.   No antithrombotic due to hemorrhage per neurology  Notes. Therapy ongoing and patient with  limitation in mobility and ability to carry out ADL tasks. CIR recommended by MD and rehab team for follow up therapy. Patient cleared for admission today.    Review of Systems  HENT: Negative for hearing loss and tinnitus.   Eyes: Positive for blurred vision (chronic) and double vision.  Respiratory: Negative for cough and shortness of breath.   Cardiovascular: Negative for chest pain and palpitations.  Gastrointestinal: Positive for blood in stool and constipation. Negative for abdominal pain (resolved) and heartburn.  Genitourinary: Negative for dysuria and urgency.  Musculoskeletal: Positive for myalgias.  Skin: Negative for itching and rash.  Neurological: Positive for dizziness and weakness.  Psychiatric/Behavioral: Negative for depression. The patient is not nervous/anxious.   All other systems reviewed and are negative.     Past Medical History:  Diagnosis Date  . Anxiety   . Ataxia, post-stroke   . Back pain   . Chronic midline low back pain without sciatica   . CVA (cerebral vascular accident) ([place]) 03/03/2017  . Hypertension   . Right hip pain     Past Surgical History:  Procedure Laterality Date  . BACK SURGERY  2011  . CATARACT EXTRACTION, BILATERAL  1999  . ESOPHAGOGASTRODUODENOSCOPY N/A 03/09/2017   Procedure: ESOPHAGOGASTRODUODENOSCOPY (EGD);  Surgeon: [name], MD;  Location: [facility] ENDOSCOPY;  Service: Endoscopy;  Laterality: N/A;    Family History  Problem Relation Age of Onset  . Stroke Mother 77  . Lung disease Father        "black lung"    Social History:  Widowed. Lives with family. 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  She reports that she smoked  for less than a year. Her smoking use included Cigarettes. She started smoking about 69 years ago. She has never used smokeless tobacco. She reports that she does not drink alcohol or use drugs.    Allergies  Allergen Reactions  . Lidocaine     Reaction (?)  .  Propranolol     Reaction (?)  . Sulfa Antibiotics     Reaction (?)    Medications Prior to Admission  Medication Sig Dispense Refill  . amLODipine (NORVASC) 5 MG tablet Take 5 mg by mouth daily.  6  . aspirin EC 81 MG tablet Take 81 mg by mouth daily with breakfast.    . clonazePAM (KLONOPIN) 0.5 MG tablet Take 0.5 mg by mouth daily as needed for anxiety.    . diclofenac (VOLTAREN) 75 MG EC tablet Take 75 mg by mouth 2 (two) times daily. WITH FOOD OR MILK  5  . diclofenac sodium (VOLTAREN) 1 % GEL Apply 1 g topically 2 (two) times daily as needed (for pain or inflammation). TO AFFECTED AREAS  2  . fluticasone (FLONASE) 50 MCG/ACT nasal spray Place 2 sprays into both nostrils daily as needed for allergies or rhinitis.   3  . hydrochlorothiazide (HYDRODIURIL) 12.5 MG tablet Take 12.5 mg by mouth daily.  3  . labetalol (NORMODYNE) 300 MG tablet Take 300 mg by mouth 2 (two) times daily.    Marland Kitchen PROAIR HFA 108 (90 Base) MCG/ACT inhaler Inhale 1 puff into the lungs every 6 (six) hours as needed for shortness of breath or wheezing.  3  . traMADol (ULTRAM) 50 MG tablet Take 50 mg by mouth 2 (two) times daily as needed for pain.  0  . Vitamin D, Ergocalciferol, (DRISDOL) 50000 units CAPS capsule Take 50,000 Units by mouth every 7 (seven) days.  6    Home: Home Living Family/patient expects to be discharged to:: Private residence Living Arrangements: Children Available Help at Discharge: Family Type of Home: House Home Access: [place]: One level Bathroom Shower/Tub: Chiropodist: Handicapped height Home Equipment: Civil engineer, contracting, Environmental consultant - 2 wheels, Radio producer - single point  Lives With: Daughter (2 daughters)   Functional History: Prior Function Level of Independence: Independent with assistive device(s) Comments: Pt uses SPC as needed. Drives.   Functional Status:  Mobility: Bed Mobility Overal bed mobility: Needs Assistance Bed Mobility: Supine to Sit,  Sit to Supine Supine to sit: HOB elevated, Min assist Sit to supine: Min assist General bed mobility comments: Assist to elevate trunk to EOB. Truncal ataxia but mild right lateral lean noted Transfers Overall transfer level: Needs assistance Equipment used: 2 person hand held assist Transfers: Sit to/from Stand, Stand Pivot Transfers Sit to Stand: +2 safety/equipment, Min assist Stand pivot transfers: +2 safety/equipment, Min assist General transfer comment: Assist to power to standing. Able to take a few steps to chair with Min A for balance, LLE ataxia with advancement. + dizziness and nausea. Ambulation/Gait Ambulation/Gait assistance: Mod assist, +2 safety/equipment Ambulation Distance (Feet): 30 Feet (15' x2) Assistive device: 1 person hand held assist Gait Pattern/deviations: Step-through pattern, Decreased stride length, Ataxic, Trunk flexed, Narrow base of support General Gait Details: VC's for improved posture, wider BOS, and step-through gait pattern. Pt held to L railing and to therapist on the R side. Close chair follow was utilized for safety.  Gait velocity: Decreased Gait velocity interpretation: Below normal speed for age/gender    ADL: ADL Overall ADL's : Needs assistance/impaired Eating/Feeding: Minimal assistance, Sitting  Grooming: Moderate assistance, Standing, Wash/dry hands Grooming Details (indicate cue type and reason): mod assist for standing balance. cues for sequencing Upper Body Bathing: Moderate assistance, Sitting Lower Body Bathing: Maximal assistance, Sit to/from stand Upper Body Dressing : Moderate assistance, Sitting Lower Body Dressing: Maximal assistance, Sit to/from stand Toilet Transfer: Maximal assistance, +2 for physical assistance, Ambulation, Comfort height toilet, Grab bars Toileting- Clothing Manipulation and Hygiene: Maximal assistance, Sit to/from stand Functional mobility during ADLs: Maximal assistance, +2 for physical  assistance  Cognition: Cognition Overall Cognitive Status: Impaired/Different from baseline Arousal/Alertness: Awake/alert Orientation Level: Oriented X4 Attention: Sustained Sustained Attention: Impaired Sustained Attention Impairment: Verbal complex Memory: Impaired Memory Impairment: Storage deficit, Retrieval deficit, Decreased recall of new information Awareness: Impaired Awareness Impairment: Intellectual impairment, Emergent impairment, Anticipatory impairment Problem Solving: Impaired Problem Solving Impairment: Functional basic Safety/Judgment: Impaired Cognition Arousal/Alertness: Awake/alert Behavior During Therapy: WFL for tasks assessed/performed Overall Cognitive Status: Impaired/Different from baseline Area of Impairment: Attention, Following commands, Awareness, Problem solving Current Attention Level: Sustained Following Commands: Follows one step commands inconsistently, Follows one step commands with increased time Awareness: Emergent Problem Solving: Slow processing, Difficulty sequencing, Requires verbal cues, Requires tactile cues General Comments: patient having difficulty sequencing basic tasks such as hygiene performance or following commands from therapy team. Some confusion with directional instruction and delays with processing    Blood pressure (!) 142/47, pulse 85, temperature 97.8 F (36.6 C), temperature source Oral, resp. rate 18, height _0  (1.702 m), weight 72.3 kg (159 lb 4.8 oz), SpO2 97 %. Physical Exam  Nursing note and vitals reviewed. Constitutional: She is oriented to person, place, and time. She appears well-developed and well-nourished. No distress.  HENT:  Head: Normocephalic and atraumatic.  Mouth/Throat: Oropharynx is clear and moist.  Eyes: Pupils are equal, round, and reactive to light. Conjunctivae and EOM are normal.  Neck: Normal range of motion. Neck supple.  Cardiovascular: Normal rate and regular rhythm.   Respiratory:  Effort normal and breath sounds normal. No stridor. No respiratory distress. She has no wheezes.  GI: Soft. Bowel sounds are normal. She exhibits no distension. There is no tenderness.  Musculoskeletal: She exhibits edema (min edema bilateral feet). She exhibits no tenderness.  Heels boggy.   Neurological: She is alert and oriented to person, place, and time. A cranial nerve deficit is present.  Speech clear.  Few beats nystagmus horizontally.  Able to follow basic commands without difficulty.  Motor: 5/5 grossly throughout RUE: Dysmetria LUE: Dysmetria and ataxia  Skin: Skin is warm and dry. She is not diaphoretic.  Psychiatric: She has a normal mood and affect. Her behavior is normal. Thought content normal.    Results for orders placed or performed during the hospital encounter of 03/03/17 (from the past 48 hour(s))  Basic metabolic panel     Status: Abnormal   Collection Time: 03/09/17  2:10 AM  Result Value Ref Range   Sodium 138 135 - 145 mmol/L   Potassium 3.9 3.5 - 5.1 mmol/L    Comment: DELTA CHECK NOTED   Chloride 100 (L) 101 - 111 mmol/L   CO2 27 22 - 32 mmol/L   Glucose, Bld 137 (H) 65 - 99 mg/dL   BUN 16 6 - 20 mg/dL   Creatinine, Ser 0.89 0.44 - 1.00 mg/dL   Calcium 9.3 8.9 - 10.3 mg/dL   GFR calc non Af Amer 57 (L) >60 mL/min   GFR calc Af Amer >60 >60 mL/min    Comment: (NOTE) The eGFR has been  calculated using the CKD EPI equation. This calculation has not been validated in all clinical situations. eGFR's persistently <60 mL/min signify possible Chronic Kidney Disease.    Anion gap 11 5 - 15  Magnesium     Status: None   Collection Time: 03/09/17  2:10 AM  Result Value Ref Range   Magnesium 2.0 1.7 - 2.4 mg/dL  Phosphorus     Status: None   Collection Time: 03/09/17  2:10 AM  Result Value Ref Range   Phosphorus 4.6 2.5 - 4.6 mg/dL  CBC     Status: Abnormal   Collection Time: 03/09/17  2:10 AM  Result Value Ref Range   WBC 8.7 4.0 - 10.5 K/uL   RBC  3.81 (L) 3.87 - 5.11 MIL/uL   Hemoglobin 11.9 (L) 12.0 - 15.0 g/dL   HCT 35.1 (L) 36.0 - 46.0 %   MCV 92.1 78.0 - 100.0 fL   MCH 31.2 26.0 - 34.0 pg   MCHC 33.9 30.0 - 36.0 g/dL   RDW 13.2 11.5 - 15.5 %   Platelets 265 150 - 400 K/uL   Ct Head Wo Contrast  Result Date: 03/10/2017 CLINICAL DATA:  Left cerebellar infarct EXAM: CT HEAD WITHOUT CONTRAST TECHNIQUE: Contiguous axial images were obtained from the base of the skull through the vertex without intravenous contrast. COMPARISON:  Several recent head CT examinations, most recent March 05, 2017; brain MRI March 03, 2017 FINDINGS: Brain: Mild generalized atrophy is stable. Recent left cerebellar infarct is again noted with decreased attenuation in this area. The previously noted hematoma in this area is no longer well-defined with less acute appearing hemorrhage and less well-defined hemorrhage in this area compared to most recent CT examination. There is more diffuse hemorrhage with cytotoxic edema in this area; there does not appear to be progression of the infarct itself, however. The edema abuts but does not efface the fourth ventricle ; fourth ventricle remains in the midline and appears unchanged compared to most recent studies. Quadrigeminal plate cisterns do not appear effaced and appears stable. Elsewhere, there is no mass or hemorrhage. No midline shift or extra-axial fluid collection. There is small vessel disease throughout the centra semiovale bilaterally, stable. No new infarct is demonstrable on this study. Vascular: There is no appreciable no hyperdense vessel. There is calcification in each carotid siphon region. Skull: Bony calvarium appears intact. There is pannus posterior to the odontoid. No impression on the craniocervical junction. Sinuses/Orbits: There is mucosal thickening in several ethmoid air cells bilaterally. Other visualized paranasal sinuses are clear. Visualized orbits appear symmetric bilaterally. Other: Mastoid air  cells are clear. IMPRESSION: 1. The left cerebellar infarct is again noted. There has been partial dissolution of hematoma within this acute infarct with more diffuse and vague appearing hemorrhage compared to recent study. Well-defined hematoma is no longer evident. The overall extent of this left cerebellar infarct is stable. There is cytotoxic edema in this area which abuts but does not efface the fourth ventricle. There is no effacement of quadrigeminal plate cisterns. Fourth ventricle is midline. 2. No new hemorrhage or new infarct evident. There is periventricular small vessel disease throughout the centra semiovale bilaterally, stable. There is no well-defined mass, midline shift, or extra-axial fluid collection. 3. There is calcification in each carotid siphon region. There is mucosal thickening in several ethmoid air cells. Electronically Signed   By: Lowella Grip III M.D.   On: 03/10/2017 13:35   Mr Cervical Spine Wo Contrast  Result Date: 03/09/2017 CLINICAL DATA:  Initial evaluation for spinal stenosis. EXAM: MRI CERVICAL SPINE WITHOUT CONTRAST TECHNIQUE: Multiplanar, multisequence MR imaging of the cervical spine was performed. No intravenous contrast was administered. COMPARISON:  Prior MRI from 06/07/2016. FINDINGS: Alignment: Straightening of the normal cervical lordosis. Trace anterolisthesis of C7 on T1. Vertebrae: Vertebral body heights maintained. No evidence for acute or chronic fracture. Signal intensity within the vertebral body bone marrow within normal limits. No discrete or worrisome osseous lesions. Cord: Subtle focus of T2 signal intensity within the right aspect of the cord at the level of C4-5 (series 7, image 16), most consistent with a small focus of myelomalacia. Signal intensity within the cervical spinal cord is otherwise normal. Posterior Fossa, vertebral arteries, paraspinal tissues: Evolving left cerebellar hemorrhage with associated mass effect noted, better evaluated  on recent exams. Craniocervical junction normal. Posterior and paraspinous soft tissues demonstrate no acute abnormality. Lipoma within the left upper back noted. Normal intravascular flow voids present within the vertebral arteries bilaterally. Disc levels: C2-C3: Minimal disc bulge with bilateral uncovertebral spurring, greater on the left. Left-sided facet degeneration. Resultant moderate left C3 foraminal stenosis. No significant canal or right neural foraminal encroachment. C3-C4: Broad posterior disc protrusion flattens and effaces the ventral thecal sac. Secondary flattening of the cervical spinal cord without cord signal changes. Severe spinal stenosis with the thecal sac measuring 5-6 mm in AP diameter. Mild bilateral C4 foraminal stenosis. C4-C5: Diffuse degenerative disc bulge with intervertebral disc space narrowing and bilateral uncovertebral spurring. Broad multilobulated posterior disc protrusion, with largest component at the right central location (series 7, image 16). Protruding disc indents and flattens the ventral thecal sac, with flattening of the cervical spinal cord, greatest on the right. Cephalad and caudad migration of disc material noted (series 3, image 7). Superimposed ligamentum flavum thickening. Associated small focus of myelomalacia within the right aspect of the cord at this level (series 7, image 16). Severe spinal stenosis with the thecal sac measuring approximately 3 mm in AP diameter at its most narrow point. Severe right with mild to moderate left C5 foraminal stenosis. C5-C6: Broad left central disc protrusion indents and flattens the left ventral thecal sac (series 7, image 12). Flattening of the left hemi cord without cord signal changes. Moderate to severe spinal stenosis with the thecal sac measuring 5 mm at its most narrow point. Mild bilateral C6 foraminal stenosis. C6-C7: Diffuse degenerative disc bulge with bilateral uncovertebral hypertrophy. Bulging disc flattens and  partially faces the ventral thecal sac. Mild ligamentum flavum thickening. Mild cord flattening without cord signal changes. Moderate spinal stenosis. Severe left with moderate right C7 foraminal stenosis. C7-T1: Trace anterolisthesis. Bilateral facet degeneration, greater on the left. Minimal disc bulge. No significant spinal stenosis. Mild left C8 foraminal narrowing. Visualized upper thoracic spine within normal limits. IMPRESSION: 1. Advanced multilevel degenerative spondylolysis as above with resultant moderate to severe spinal stenosis at C3-4 through C6-7. Changes most severe at the C4-5 level. 2. Small focus of T2 hyperintensity within the right aspect of the cord at C4-5, most consistent with myelomalacia. 3. Multifactorial degenerative changes with multilevel foraminal narrowing as above. Notable findings include moderate left C3 foraminal stenosis, severe right with moderate left C5 foraminal stenosis, and severe left with moderate right C7 foraminal stenosis. 4. Evolving left cerebellar hemorrhagic infarct with associated edema, better evaluated on recent exams. Electronically Signed   By: Jeannine Boga M.D.   On: 03/09/2017 22:48       Medical Problem List and Plan: 1.  Balance deficits, gait abnormality,  limitations in self-care secondary to  left cerebellar infarct with hemorrhagic conversion. 2.  DVT Prophylaxis/Anticoagulation: Pharmaceutical: Lovenox 3. Pain Management: tylenol prn 4. Mood: LCSW to follow for evaluation and support.  5. Neuropsych: This patient is capable of making decisions on her own behalf. 6. Skin/Wound Care: routine pressure relief measures 7. Fluids/Electrolytes/Nutrition: Monitor I/O. Check lytes in am. Push fluids to avoid orthostatic symptoms 8. XBD:ZHGDJME BP bid. Continue HCTZ, labetalol and Norvasc.  9. Colitis? with melena: offer fluids frequently to maintain adequate hydration. Monitor for recurrence and check H/H serially.  10. Hypokalemia:  Resolved with supplement. Recheck in am.  11. Dizziness: Will order vestibular rehab.    Post Admission Physician Evaluation: 1. Preadmission assessment reviewed and changes made below. 2. Functional deficits secondary  to left cerebellar infarct with hemorrhagic conversion. 3. Patient is admitted to receive collaborative, interdisciplinary care between the physiatrist, rehab nursing staff, and therapy team. 4. Patient's level of medical complexity and substantial therapy needs in context of that medical necessity cannot be provided at a lesser intensity of care such as a SNF. 5. Patient has experienced substantial functional loss from his/her baseline which was documented above under the "Functional History" and "Functional Status" headings.  Judging by the patient's diagnosis, physical exam, and functional history, the patient has potential for functional progress which will result in measurable gains while on inpatient rehab.  These gains will be of substantial and practical use upon discharge  in facilitating mobility and self-care at the household level. 59. Physiatrist will provide 24 hour management of medical needs as well as oversight of the therapy plan/treatment and provide guidance as appropriate regarding the interaction of the two. 7. 24 hour rehab nursing will assist with bladder management, bowel management, safety, disease management and patient education  and help integrate therapy concepts, techniques,education, etc. 8. PT will assess and treat for/with: Lower extremity strength, range of motion, stamina, balance, functional mobility, safety, adaptive techniques and equipment,, coping skills, pain control, stroke education.   Goals are: Supervision. 9. OT will assess and treat for/with: ADL's, functional mobility, safety, upper extremity strength, adaptive techniques and equipment, ego support, and community reintegration.   Goals are: Supervision. Therapy may proceed with showering  this patient. 10. Case Management and Social Worker will assess and treat for psychological issues and discharge planning. 11. Team conference will be held weekly to assess progress toward goals and to determine barriers to discharge. 12. Patient will receive at least 3 hours of therapy per day at least 5 days per week. 13. ELOS: 17-20 days.        Prognosis:  good    Delice Lesch, MD, [address], [place] 03/10/2017

## 2017-03-10 NOTE — Discharge Summary (Signed)
Stroke Discharge Summary  Patient ID: Ashley Chan   MRN: [id number]      DOB: [birth year]  Date of Admission: 03/03/2017 Date of Discharge: 03/10/2017  Attending Physician:  Garvin Fila, MD, Stroke MD Consultant(s):  Treatment Team:  Stroke, Md, MD [name], MD, Gastroenterology Patient's PCP:  Patient, No Pcp Per  Discharge Diagnoses: Principal Problem:   Hemorrhagic infarcts -  Left superior cerebellar  Active Problems:   Benign essential HTN   Coffee ground emesis - upper and lower GI work up negative    Heme + stool   HTN   HLD  Past Medical History:  Diagnosis Date  . Anxiety   . Ataxia, post-stroke   . Back pain   . Chronic midline low back pain without sciatica   . CVA (cerebral vascular accident) ([place]) 03/03/2017  . Hypertension   . Right hip pain    Past Surgical History:  Procedure Laterality Date  . BACK SURGERY  2011  . CATARACT EXTRACTION, BILATERAL  1999  . ESOPHAGOGASTRODUODENOSCOPY N/A 03/09/2017   Procedure: ESOPHAGOGASTRODUODENOSCOPY (EGD);  Surgeon: [name], MD;  Location: [facility] ENDOSCOPY;  Service: Endoscopy;  Laterality: N/A;    Medications to be continued on Rehab . amLODipine  10 mg Oral Daily  . aspirin EC  325 mg Oral Daily  . atorvastatin  40 mg Oral q1800  . hydrochlorothiazide  25 mg Oral Daily  . labetalol  100 mg Oral BID  . pantoprazole  40 mg Oral Daily  . potassium chloride  20 mEq Oral BID  . Vitamin D (Ergocalciferol)  50,000 Units Oral Q7 days    LABORATORY STUDIES CBC    Component Value Date/Time   WBC 8.7 03/09/2017 0210   RBC 3.81 (L) 03/09/2017 0210   HGB 11.9 (L) 03/09/2017 0210   HCT 35.1 (L) 03/09/2017 0210   PLT 265 03/09/2017 0210   MCV 92.1 03/09/2017 0210   MCH 31.2 03/09/2017 0210   MCHC 33.9 03/09/2017 0210   RDW 13.2 03/09/2017 0210   LYMPHSABS 2.0 03/07/2017 0514   MONOABS 0.6 03/07/2017 0514   EOSABS 0.4 03/07/2017 0514   BASOSABS 0.0 03/07/2017 0514   CMP    Component Value  Date/Time   NA 138 03/09/2017 0210   K 3.9 03/09/2017 0210   CL 100 (L) 03/09/2017 0210   CO2 27 03/09/2017 0210   GLUCOSE 137 (H) 03/09/2017 0210   BUN 16 03/09/2017 0210   CREATININE 0.89 03/09/2017 0210   CALCIUM 9.3 03/09/2017 0210   PROT RESULTS UNAVAILABLE DUE TO INTERFERING SUBSTANCE 03/04/2017 0329   ALBUMIN 3.4 (L) 03/04/2017 0329   AST 99 (H) 03/04/2017 0329   ALT 63 (H) 03/04/2017 0329   ALKPHOS 45 03/04/2017 0329   BILITOT 1.9 (H) 03/04/2017 0329   GFRNONAA 57 (L) 03/09/2017 0210   GFRAA >60 03/09/2017 0210   COAGS Lab Results  Component Value Date   INR 1.06 03/07/2017   Lipid Panel    Component Value Date/Time   CHOL 216 (H) 03/04/2017 0329   TRIG 969 (H) 03/04/2017 0329   HDL 26 (L) 03/04/2017 0329   CHOLHDL 8.3 03/04/2017 0329   VLDL UNABLE TO CALCULATE IF TRIGLYCERIDE OVER 400 mg/dL 03/04/2017 0329   LDLCALC UNABLE TO CALCULATE IF TRIGLYCERIDE OVER 400 mg/dL 03/04/2017 0329   HgbA1C  Lab Results  Component Value Date   HGBA1C 5.4 03/04/2017   Urinalysis No results found for: COLORURINE, APPEARANCEUR, [place], [place], [place], HGBUR,  BILIRUBINUR, KETONESUR, PROTEINUR, UROBILINOGEN, NITRITE, LEUKOCYTESUR Urine Drug Screen No results found for: LABOPIA, COCAINSCRNUR, LABBENZ, AMPHETMU, THCU, LABBARB  Alcohol Level No results found for: [place] I have personally reviewed the radiological images below and agree with the radiology interpretations. Red text is my interpretation.  Ct Angio Head W Or Wo Contrast Ct Angio Neck W Or Wo Contrast 03/03/2017  CTA neck:  1. Patent carotid and vertebral arteries of the neck.  2. Moderate right carotid bifurcation calcified plaque with mild 30% proximal ICA stenosis.  3. Severe left carotid bifurcation calcified plaque with severe 70% proximal ICA stenosis. (apparently the 70% was ECA stenosis not ICA stenosis).  4. Cervical degenerative changes with calcified posterior longitudinal  ligament and ligamentum flavum from C4-5 the C5-6 with severe canal stenosis.  5. Thyroid nodules measuring up to 15 mm. Further evaluation with thyroid ultrasound recommended.  CTA head:  1. Patent circle of Willis. No large vessel occlusion, aneurysm, or vascular malformation identified.  2. Calcific plaque of the carotid siphons with mild right and moderate to severe left paraclinoid stenosis.  3. No appreciable left AICA, possibly diminutive or occluded.   CT head:  1. Left superior cerebellar hemorrhage, associated vasogenic edema, and mass effect are stable in comparison with prior MRI given differences in technique.  2. Stable mild superior transtentorial herniation, partial effacement of quadrigeminal plate cistern, and of fourth ventricle. No hydrocephalus.  3. No new acute intracranial abnormality.   Ct Head Wo Contrast 03/04/2017 1. Slight interval increase in size of left superior cerebellar hematoma as above with slightly worsened associated vasogenic edema. Associated mass effect on the adjacent fourth ventricle slightly worsened, although remains patent at this time. No hydrocephalus.  2. No other new acute intracranial process.   Dg Chest Port 1 View 03/03/2017 Improved aeration of the left lower lung. Atelectasis remains at both lung bases.  TTE 03/04/2017  Left ventricle: The cavity size was normal. There was moderate concentric hypertrophy. Systolic function was normal. The estimated ejection fraction was in the range of 60% to 65%. Wall motion was normal; there were no regional wall motion abnormalities  Carotid US 03/04/2017 The vertebral arteries appear patent with antegrade flow. - Findings consistent with a 1-39 percent stenosis involving the right internal carotid artery. Right external artery demonstrates elevated velocicites. - Findings consistent with a 1-39% stenosis by diastolic velocity, peak systolic internal carotid artery velocities  fall within the 60 - 79% stenosis range  MRI brain Acute hemorrhagic infarction left superior cerebellum with mass effect on the fourth ventricle. No obstructive hydrocephalus.   MRI C-spine 1. Advanced multilevel degenerative spondylolysis as above with resultant moderate to severe spinal stenosis at C3-4 through C6-7. Changes most severe at the C4-5 level. 2. Small focus of T2 hyperintensity within the right aspect of the cord at C4-5, most consistent with myelomalacia. 3. Multifactorial degenerative changes with multilevel foraminal narrowing as above. Notable findings include moderate left C3 foraminal stenosis, severe right with moderate left C5 foraminal stenosis, and severe left with moderate right C7 foraminal stenosis. 4. Evolving left cerebellar hemorrhagic infarct with associated edema, better evaluated on recent exams.      HISTORY OF PRESENT ILLNESS Ashley Chan is an 83 y.o. female with history of hypertension who presented to [facility] emergency room complaining of not feeling well. Apparently patient woke up in the morning not feeling well and then started noticing dizziness followed by nausea vomiting and some coffee-ground emesis.  Initial head CT was negative. Unfortunately they were unable to get MRI secondary to no MRIs being done at [facility] over the weekend. 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 For this reason patient was transferred to [facility] for further evaluation and care.  Date last known well: Date: 03/02/2017 Time last known well: Unable to determine tPA Given: No: Hemorrhagic transformation, no last known normal  Modified Rankin: Rankin Score=0    HOSPITAL COURSE [name] is a 83 y.o. female with history of hypertension presenting with dizziness, nausea, and coffee ground  emesis.  She did not receive IV t-PA due to [place].   Stroke: Left superior cerebellar hemorrhagic infarcts, large vessel athero vs. Embolic source.  Resultant  mild left hemi-ataxia  CT head: Slightly increased left superior cerebellar hematoma with slightly increased vasogenic edema.  Mass effect on adjacent fourth ventricle without hydrocephalus.    CTA Head/Neck: b/l ICA proximal atherosclerosis, mod-sever Lt paraclinoid stenosis, no appreciable Lt AICA  MRI head left AICA hemorrhagic conversion  Carotid Doppler: EF 60-65%. No source of embolus  2D Echo: B ICA 1-39% stenosis, VAs antegrade  Recommend 30 day cardiac event monitoring as outpt to rule out afib.  LDL not able to calculate, TG 909, total chol 216  HgbA1c 5.4  SCDs for VTE prophylaxis  No antithrombotic prior to admission, now on ASA for stroke prevention. Continue ASA on discharge.  Patient counseled to be compliant with her antithrombotic medications  Ongoing aggressive stroke risk factor management  Therapy recommendations: CIR  Disposition: CIR  ? GIB   On admission, coffee ground emesis, and fecal occult postive  Upper and lower GI work up unremarkable.   H&H stable  Resume ASA For stroke prevention  Cervical spinal stenosis  MRI C-spine showed Advanced multilevel degenerative spondylolysis  Mild spinal cord chronic malacia, asymptomatic  No intervention needed  outpt follow up with neusurgery  Hypertension  stable on Labetolol 100 mg bid   Goal SBP < 180 mmHg  Amlodipine 10mg  PO daily   HCTZ 25mg  PO daily  Long-term BP goal normotensive  Hyperlipidemia  Home meds: none  LDL UTC due to high TG, goal < 70  lipitor started  Start statin prior to discharge  Other Stroke Risk Factors  Advanced age   Other Active Problems   Hypokalemia - 3.0 -> 3.9  Thyroid nodules measuring up to 15 mm. [place] 03/09/2017 -> annual f/u recommended.   DISCHARGE EXAM Blood pressure  (!) 142/47, pulse 85, temperature 97.8 F (36.6 C), temperature source Oral, resp. rate 18, height 5\' 7"  (1.702 m), weight 72.3 kg (159 lb 4.8 oz), SpO2 97 %. Pleasant elderly African-American lady currently not in distress.  Afebrile. Head is nontraumatic. Neck is supple without bruit.    Cardiac exam no murmur or gallop. Lungs are clear to auscultation. Distal pulses are well felt.  Neurological Exam : Awake alert oriented 3. Diminished attention and recall. Follows commands well. Extraocular movements are full range but there is saccadic dysmetria on left gaze. Vision acuity and fields seem adequate. Face is symmetric without weakness. Tongue is midline. Mild weakness of left grip and intrinsic hand muscles. Fine finger movements are diminished on the left compared to the right. No upper or lower extremity drift noted. mild dysmetria on the left.  Deep tendon flow is symmetric. Plantars downgoing. Gait not tested.  Discharge Diet  DIET SOFT Room service appropriate? Yes; Fluid consistency: Thin liquids   DISCHARGE PLAN  Disposition:  Transfer to [facility] for ongoing PT, OT and ST  aspirin 325 mg daily for secondary stroke prevention.  Recommend ongoing risk factor control by Primary Care Physician at time of discharge from inpatient rehabilitation.  Follow-up PCP in 2 weeks following discharge from rehab.  Follow-up with Dr. Antony Contras, Stroke Clinic in 6 weeks, office to schedule an appointment.   40 minutes were spent preparing discharge.  Rosalin Hawking, MD PhD Stroke Neurology 03/10/2017 10:18 PM

## 2017-03-10 NOTE — Progress Notes (Signed)
Received pt. As a transfer from [facility] M. Pt. And her family were oriented to rehab unit protocol.Safety plan was explained,fall prevention plan was explained and signed by pt. And RN.

## 2017-03-10 NOTE — Progress Notes (Signed)
Patient finished drinking prep for colonoscopy, however, stool is still brown. Continuing to have frequent watery stools.

## 2017-03-10 NOTE — Anesthesia Postprocedure Evaluation (Signed)
Anesthesia Post Note  Patient: Ashley Chan  Procedure(s) Performed: Procedure(s) (LRB): COLONOSCOPY WITH PROPOFOL (N/A)     Patient location during evaluation: PACU Anesthesia Type: MAC Level of consciousness: awake and alert Pain management: pain level controlled Vital Signs Assessment: post-procedure vital signs reviewed and stable Respiratory status: spontaneous breathing, nonlabored ventilation, respiratory function stable and patient connected to nasal cannula oxygen Cardiovascular status: stable and blood pressure returned to baseline Anesthetic complications: no    Last Vitals:  Vitals:   03/10/17 1141 03/10/17 1150  BP: (!) 138/52 (!) 142/72  Pulse: 74 76  Resp: 17 17  Temp: 36.8 C   SpO2: 100% 100%    Last Pain:  Vitals:   03/10/17 1141  TempSrc: Oral  PainSc:                  Tiajuana Amass

## 2017-03-10 NOTE — Op Note (Signed)
[facility] Patient Name: Ashley Chan Procedure Date : 03/10/2017 MRN: [id number] Attending MD: Ladene Artist , MD Date of Birth: Nov 26, 1928 CSN: [id number] Age: 83 Admit Type: Inpatient Procedure:                Colonoscopy Indications:              Heme positive stool Providers:                Pricilla Riffle. Fuller Plan, MD, Zenon Mayo, RN, Marcene Duos, Technician Referring MD:             Leonia Corona, MD Medicines:                Monitored Anesthesia Care Complications:            No immediate complications. Estimated blood loss:                            None. Estimated Blood Loss:     Estimated blood loss: none. Procedure:                Pre-Anesthesia Assessment:                           - Prior to the procedure, a History and Physical                            was performed, and patient medications and                            allergies were reviewed. The patient's tolerance of                            previous anesthesia was also reviewed. The risks                            and benefits of the procedure and the sedation                            options and risks were discussed with the patient.                            All questions were answered, and informed consent                            was obtained. Prior Anticoagulants: The patient has                            taken no previous anticoagulant or antiplatelet                            agents. ASA Grade Assessment: III - A patient with  severe systemic disease. After reviewing the risks                            and benefits, the patient was deemed in                            satisfactory condition to undergo the procedure.                           After obtaining informed consent, the colonoscope                            was passed under direct vision. Throughout the                            procedure, the patient's blood  pressure, pulse, and                            oxygen saturations were monitored continuously. The                            Colonoscope was introduced through the anus and                            advanced to the the cecum, identified by                            appendiceal orifice and ileocecal valve. The                            ileocecal valve, appendiceal orifice, and rectum                            were photographed. The quality of the bowel                            preparation was adequate. The colonoscopy was                            performed without difficulty. The patient tolerated                            the procedure well. Scope In: 11:15:03 AM Scope Out: 11:31:21 AM Scope Withdrawal Time: 0 hours 13 minutes 5 seconds  Total Procedure Duration: 0 hours 16 minutes 18 seconds  Findings:      The perianal and digital rectal examinations were normal.      Two sessile polyps were found in the transverse colon. The polyps were 5       to 8 mm in size. These polyps were removed with a cold snare. Resection       and retrieval were complete.      Internal hemorrhoids were found during retroflexion. The hemorrhoids       were small and Grade I (internal hemorrhoids that do not prolapse).      A few medium-mouthed  diverticula were found in the sigmoid colon. There       was no evidence of diverticular bleeding.      A single (solitary) ten mm ulcer was found in the sigmoid colon with       surrounding erythema, edema. No bleeding was present. No stigmata of       recent bleeding were seen. Biopsies were taken with a cold forceps for       histology.      The exam was otherwise without abnormality on direct and retroflexion       views. Impression:               - Two 5 to 8 mm polyps in the transverse colon,                            removed with a cold snare. Resected and retrieved.                           - Internal hemorrhoids.                           -  Mild diverticulosis in the sigmoid colon. There                            was no evidence of diverticular bleeding.                           - A single (solitary) ulcer in the sigmoid colon.                            Biopsied.                           - The examination was otherwise normal on direct                            and retroflexion views. Moderate Sedation:      none/MAC Recommendation:           - Patient has a contact number available for                            emergencies. The signs and symptoms of potential                            delayed complications were discussed with the                            patient. Return to normal activities tomorrow.                            Written discharge instructions were provided to the                            patient.                           -  Resume previous diet.                           - Continue present medications.                           - Await pathology results.                           - No ibuprofen, naproxen, or other non-steroidal                            anti-inflammatory drugs for 2 weeks. ASA qd is ok                            if needed.                           - Suspected ischemic colitis leading to ulcer and                            heme pos stool. This should resolve on its own.                            Maintain adequate hydration long term.                           - No additional GI evaluation is recommended at                            this time.                           - No repeat colonoscopy due to age. Procedure Code(s):        --- Professional ---                           [phone], Colonoscopy, flexible; with removal of                            tumor(s), polyp(s), or other lesion(s) by snare                            technique                           45380, 26, Colonoscopy, flexible; with biopsy,                            single or multiple Diagnosis Code(s):         --- Professional ---                           D12.3, Benign neoplasm of transverse colon (hepatic  flexure or splenic flexure)                           K64.0, First degree hemorrhoids                           K63.3, Ulcer of intestine                           R19.5, Other fecal abnormalities                           K57.30, Diverticulosis of large intestine without                            perforation or abscess without bleeding CPT copyright 2016 American Medical Association. All rights reserved. The codes documented in this report are preliminary and upon coder review may  be revised to meet current compliance requirements. Ladene Artist, MD 03/10/2017 11:41:22 AM This report has been signed electronically. Number of Addenda: 0

## 2017-03-10 NOTE — Transfer of Care (Signed)
Immediate Anesthesia Transfer of Care Note  Patient: Ashley Chan  Procedure(s) Performed: Procedure(s): COLONOSCOPY WITH PROPOFOL (N/A)  Patient Location: Endoscopy Unit  Anesthesia Type:MAC  Level of Consciousness: awake, alert  and oriented  Airway & Oxygen Therapy: Patient Spontanous Breathing and Patient connected to face mask oxygen  Post-op Assessment: Report given to RN, Post -op Vital signs reviewed and stable and Patient moving all extremities  Post vital signs: Reviewed and stable  Last Vitals:  Vitals:   03/10/17 1020 03/10/17 1141  BP: (!) 168/67 (!) 138/52  Pulse: 84 74  Resp: 16 17  Temp: 36.7 C   SpO2: 96% 100%    Last Pain:  Vitals:   03/10/17 1020  TempSrc: Oral  PainSc:       Patients Stated Pain Goal: 0 (17/51/02 5852)  Complications: No apparent anesthesia complications

## 2017-03-10 NOTE — Progress Notes (Signed)
Physical Therapy Treatment Patient Details Name: Ashley Chan MRN: [id number] DOB: [birth year] Today's Date: 03/10/2017    History of Present Illness Patient is a 83 y/o female who presents with dizziness, N/V and some coffee-ground emesis. 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 PMH includes HTN.     PT Comments    Pt progressing towards physical therapy goals. Was able to perform transfers and ambulation with +2 assist for balance support, safety, and chair follow. Pt doing well with L railing for support in hall vs RW use. Will continue to follow and progress as able per POC.    Follow Up Recommendations  CIR;Supervision for mobility/OOB     Equipment Recommendations  Other (comment) (defer to next venue of care)    Recommendations for Other Services OT consult     Precautions / Restrictions Precautions Precautions: Fall Precaution Comments: SBP <140 Restrictions Weight Bearing Restrictions: No    Mobility  Bed Mobility Overal bed mobility: Needs Assistance Bed Mobility: Supine to Sit;Sit to Supine     Supine to sit: HOB elevated;Min assist Sit to supine: Min assist   General bed mobility comments: Assist to elevate trunk to EOB. Truncal ataxia but mild right lateral lean noted  Transfers Overall transfer level: Needs assistance Equipment used: 2 person hand held assist Transfers: Sit to/from [employer] Transfers Sit to Stand: +2 safety/equipment;Min assist Stand pivot transfers: +2 safety/equipment;Min assist       General transfer comment: Assist to power to standing. Able to take a few steps to chair with Min A for balance, LLE ataxia with advancement. + dizziness and nausea.  Ambulation/Gait Ambulation/Gait assistance: Mod assist;+2 safety/equipment Ambulation Distance (Feet): 30 Feet (15' x2) Assistive device: 1 person hand held assist Gait  Pattern/deviations: Step-through pattern;Decreased stride length;Ataxic;Trunk flexed;Narrow base of support Gait velocity: Decreased Gait velocity interpretation: Below normal speed for age/gender General Gait Details: VC's for improved posture, wider BOS, and step-through gait pattern. Pt held to L railing and to therapist on the R side. Close chair follow was utilized for safety.    Stairs            Wheelchair Mobility    Modified Rankin (Stroke Patients Only) Modified Rankin (Stroke Patients Only) Pre-Morbid Rankin Score: No symptoms Modified Rankin: Moderately severe disability     Balance Overall balance assessment: Needs assistance Sitting-balance support: Feet supported;No upper extremity supported Sitting balance-Leahy Scale: Fair     Standing balance support: During functional activity;Bilateral upper extremity supported Standing balance-Leahy Scale: Poor Standing balance comment: Reliant on UEs and external support for balance due to ataxia.                            Cognition Arousal/Alertness: Awake/alert Behavior During Therapy: WFL for tasks assessed/performed Overall Cognitive Status: Impaired/Different from baseline Area of Impairment: Attention;Following commands;Awareness;Problem solving                   Current Attention Level: Sustained   Following Commands: Follows one step commands inconsistently;Follows one step commands with increased time   Awareness: Emergent Problem Solving: Slow processing;Difficulty sequencing;Requires verbal cues;Requires tactile cues        Exercises      General Comments        Pertinent Vitals/Pain Pain Assessment: No/denies pain    Home Living  Prior Function            PT Goals (current goals can now be found in the care plan section) Acute Rehab PT Goals Patient Stated Goal: to return to independence PT Goal Formulation: With patient Time For Goal  Achievement: 03/18/17 Potential to Achieve Goals: Good Progress towards PT goals: Progressing toward goals    Frequency    Min 4X/week      PT Plan Current plan remains appropriate    Co-evaluation              AM-PAC PT "6 Clicks" Daily Activity  Outcome Measure  Difficulty turning over in bed (including adjusting bedclothes, sheets and blankets)?: Total Difficulty moving from lying on back to sitting on the side of the bed? : Total Difficulty sitting down on and standing up from a chair with arms (e.g., wheelchair, bedside commode, etc,.)?: Total Help needed moving to and from a bed to chair (including a wheelchair)?: A Lot Help needed walking in hospital room?: A Lot Help needed climbing 3-5 steps with a railing? : Total 6 Click Score: 8    End of Session Equipment Utilized During Treatment: Gait belt Activity Tolerance: Patient tolerated treatment well Patient left: in bed;with call bell/phone within reach;with bed alarm set Nurse Communication: Mobility status PT Visit Diagnosis: Difficulty in walking, not elsewhere classified (R26.2);Unsteadiness on feet (R26.81);Ataxic gait (R26.0);Dizziness and giddiness (R42)     Time: 2620-3559 PT Time Calculation (min) (ACUTE ONLY): 28 min  Charges:  $Gait Training: 23-37 mins                    G Codes:       Rolinda Roan, PT, DPT Acute Rehabilitation Services Pager: [phone]    Thelma Comp 03/10/2017, 2:46 PM

## 2017-03-11 ENCOUNTER — Inpatient Hospital Stay (HOSPITAL_COMMUNITY): Payer: No Typology Code available for payment source | Admitting: Occupational Therapy

## 2017-03-11 ENCOUNTER — Encounter (HOSPITAL_COMMUNITY): Payer: Self-pay | Admitting: *Deleted

## 2017-03-11 ENCOUNTER — Inpatient Hospital Stay (HOSPITAL_COMMUNITY): Payer: No Typology Code available for payment source | Admitting: Speech Pathology

## 2017-03-11 ENCOUNTER — Inpatient Hospital Stay (HOSPITAL_COMMUNITY): Payer: No Typology Code available for payment source | Admitting: Physical Therapy

## 2017-03-11 LAB — COMPREHENSIVE METABOLIC PANEL
ALT: 40 U/L (ref 14–54)
AST: 24 U/L (ref 15–41)
Albumin: 3.1 g/dL — ABNORMAL LOW (ref 3.5–5.0)
Alkaline Phosphatase: 60 U/L (ref 38–126)
Anion gap: 9 (ref 5–15)
BUN: 15 mg/dL (ref 6–20)
CHLORIDE: 105 mmol/L (ref 101–111)
CO2: 26 mmol/L (ref 22–32)
CREATININE: 0.86 mg/dL (ref 0.44–1.00)
Calcium: 9.2 mg/dL (ref 8.9–10.3)
GFR calc Af Amer: >60 mL/min (ref >60)
GFR, EST NON AFRICAN AMERICAN: 60 mL/min — AB (ref >60)
Glucose, Bld: 122 mg/dL — ABNORMAL HIGH (ref 65–99)
POTASSIUM: 4 mmol/L (ref 3.5–5.1)
Sodium: 140 mmol/L (ref 135–145)
Total Bilirubin: 0.6 mg/dL (ref 0.3–1.2)
Total Protein: 6.1 g/dL — ABNORMAL LOW (ref 6.5–8.1)

## 2017-03-11 LAB — CBC WITH DIFFERENTIAL/PLATELET
Basophils Absolute: 0 10*3/uL (ref 0.0–0.1)
Basophils Relative: 0 %
EOS PCT: 5 %
Eosinophils Absolute: 0.3 10*3/uL (ref 0.0–0.7)
HCT: 34.7 % — ABNORMAL LOW (ref 36.0–46.0)
Hemoglobin: 11.8 g/dL — ABNORMAL LOW (ref 12.0–15.0)
LYMPHS ABS: 2.3 10*3/uL (ref 0.7–4.0)
LYMPHS PCT: 34 %
MCH: 31.9 pg (ref 26.0–34.0)
MCHC: 34 g/dL (ref 30.0–36.0)
MCV: 93.8 fL (ref 78.0–100.0)
MONO ABS: 0.5 10*3/uL (ref 0.1–1.0)
Monocytes Relative: 8 %
Neutro Abs: 3.7 10*3/uL (ref 1.7–7.7)
Neutrophils Relative %: 53 %
PLATELETS: 331 10*3/uL (ref 150–400)
RBC: 3.7 MIL/uL — ABNORMAL LOW (ref 3.87–5.11)
RDW: 13.2 % (ref 11.5–15.5)
WBC: 7 10*3/uL (ref 4.0–10.5)

## 2017-03-11 MED ORDER — ENSURE ENLIVE PO LIQD
237.0000 mL | Freq: Two times a day (BID) | ORAL | Status: DC
  Administered 2017-03-11 – 2017-03-22 (×12): 237 mL via ORAL

## 2017-03-11 NOTE — Care Management Note (Signed)
[facility] Individual Statement of Services  Patient Name:  Ashley Chan  Date:  03/11/2017  Welcome to the [facility].  Our goal is to provide you with an individualized program based on your diagnosis and situation, designed to meet your specific needs.  With this comprehensive rehabilitation program, you will be expected to participate in at least 3 hours of rehabilitation therapies Monday-Friday, with modified therapy programming on the weekends.  Your rehabilitation program will include the following services:  Physical Therapy (PT), Occupational Therapy (OT), Speech Therapy (ST), 24 hour per day rehabilitation nursing, Therapeutic Recreaction (TR), Case Management (Social Worker), Rehabilitation Medicine, Nutrition Services and Pharmacy Services  Weekly team conferences will be held on Wednesday to discuss your progress.  Your Social Worker will talk with you frequently to get your input and to update you on team discussions.  Team conferences with you and your family in attendance may also be held.  Expected length of stay: 10-14 days  Overall anticipated outcome: mod/i-supervision with ambulation  Depending on your progress and recovery, your program may change. Your Social Worker will coordinate services and will keep you informed of any changes. Your Social Worker's name and contact numbers are listed  below.  The following services may also be recommended but are not provided by the [facility] will be made to provide these services after discharge if needed.  Arrangements include referral to agencies that provide these services.  Your insurance has been verified to be:  [employer] Your primary doctor is:  Dr. Manuella Ghazi  Pertinent information will be shared with your doctor and your  insurance company.  Social Worker:  Ovidio Kin, [place] or ([phone]  Information discussed with and copy given to patient by: Elease Hashimoto, 03/11/2017, 9:58 AM

## 2017-03-11 NOTE — Progress Notes (Signed)
Patient information reviewed and entered into eRehab System by Becky Galen Malkowski, covering PPS coordinator. Information including medical coding and functional independence measure will be reviewed and updated through discharge.  Per nursing, patient was given "Data Collection Information Summary for Patients in Inpatient Rehabilitation Facilities with attached Privacy Act Statement Health Care Records" upon admission.     

## 2017-03-11 NOTE — Evaluation (Signed)
Occupational Therapy Assessment and Plan  Patient Details  Name: Ashley Chan MRN: [id number] Date of Birth: 08-23-1928  OT Diagnosis: abnormal posture, apraxia, ataxia, hemiplegia affecting non-dominant side and muscle weakness (generalized) Rehab Potential: Rehab Potential (ACUTE ONLY): Good ELOS: 10-12 days   Today's Date: 03/11/2017 OT Individual Time: 1100-1200 OT Individual Time Calculation (min): 60 min     Problem List:  Patient Active Problem List   Diagnosis Date Noted  . Cerebellar stroke, acute ([facility]) 03/10/2017  . Benign neoplasm of transverse colon   . Colon ulcer   . Neurologic gait disorder   . Hypokalemia   . Dizziness and giddiness   . Coffee ground emesis   . Heme + stool   . Duodenitis   . Benign essential HTN   . Chronic midline low back pain without sciatica   . Anxiety state   . Ataxia, post-stroke   . ICH (intracerebral hemorrhage) ([facility]) -  Left superior cerebellar hematoma with increased vasogenic edema and mass effect on adjacent fourth ventricle without hydrocephalus 03/03/2017  . CVA (cerebral vascular accident) ([facility]) 03/03/2017    Past Medical History:  Past Medical History:  Diagnosis Date  . Anxiety   . Ataxia, post-stroke   . Back pain   . Chronic midline low back pain without sciatica   . CVA (cerebral vascular accident) ([facility]) 03/03/2017  . Hypertension   . Right hip pain    Past Surgical History:  Past Surgical History:  Procedure Laterality Date  . BACK SURGERY  2011  . CATARACT EXTRACTION, BILATERAL  1999  . COLONOSCOPY WITH PROPOFOL N/A 03/10/2017   Procedure: COLONOSCOPY WITH PROPOFOL;  Surgeon: Ladene Artist, MD;  Location: [facility] ENDOSCOPY;  Service: Endoscopy;  Laterality: N/A;  . ESOPHAGOGASTRODUODENOSCOPY N/A 03/09/2017   Procedure: ESOPHAGOGASTRODUODENOSCOPY (EGD);  Surgeon: [name], MD;  Location: [facility] ENDOSCOPY;  Service: Endoscopy;  Laterality: N/A;    Assessment & Plan Clinical Impression: Ashley Terrero Carteris  an87 y.o.left handed female who was admitted to OSH with reports of dizziness, malaise and coffec ground emesis. CT head unremarkable for acute process. Unable to get MRI brain due to the weekend. 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 She was transferred to [facility] for treatment and care. She was started medications for tighter BP control and required fluid boluses due to poor UOP. Repeat CT head reviewed, stable midline shift. 2 D echo showed EF 60-655 with grade 2 diastolic dysfunction, moderate mitral valve calcification and moderate pulmonary HTN. CTA head/neck showed severe left carotid bifurcation calcified plaque with severe 70% proximal L-ICA stenosis as well as thyroid nodules up to 15 mm.   She did have bradycardic episode question due to vasovagal event as well as reports of purple/red stools. Stool guaiacs heme positive and patient developed coffee ground emesis. Dr. Carlean Purl consulted for input as patient with history of gastric ulcers. EGD done yesterday and showed duodenitis but rectal exam with hard stool and dark blood on digital exam. She underwent colonoscopy today revealing ten mm ulcer in sigmoid colon with erythema and edema--bx taken, internal hems, two polyps--resected and mild diverticulosis. GI recommended maintaining adequate hydration, avoiding NSAIDs x 2 weeks and suspected that ischemic colitis led to ulcer and heme positive stools.   Patient transferred to CIR on 03/10/2017 .    Patient currently requires mod with basic self-care skills secondary to muscle weakness, decreased cardiorespiratoy endurance, motor apraxia, ataxia and decreased  coordination, central origin and decreased sitting balance, decreased standing balance, decreased postural control and decreased balance strategies.  Prior to hospitalization, patient could complete ADLs with modified independent .  Patient will  benefit from skilled intervention to decrease level of assist with basic self-care skills, increase independence with basic self-care skills and increase level of independence with iADL prior to discharge home with care partner.  Anticipate patient will require intermittent supervision and follow up home health.  OT - End of Session Activity Tolerance: Tolerates 10 - 20 min activity with multiple rests Endurance Deficit: Yes Endurance Deficit Description: Required ret breaks throughout bathing/dressing tasks OT Assessment Rehab Potential (ACUTE ONLY): Good OT Patient demonstrates impairments in the following area(s): Balance;Sensory;Other (Comment);Endurance;Behavior;Motor (Vestibular) OT Basic ADL's Functional Problem(s): Eating;Grooming;Bathing;Dressing;Toileting OT Advanced ADL's Functional Problem(s): Laundry OT Transfers Functional Problem(s): Toilet;Tub/Shower OT Additional Impairment(s): Fuctional Use of Upper Extremity OT Plan OT Intensity: Minimum of 1-2 x/day, 45 to 90 minutes OT Frequency: 5 out of 7 days OT Duration/Estimated Length of Stay: 10-12 days OT Treatment/Interventions: Medical illustrator training;Community reintegration;Discharge planning;Disease mangement/prevention;DME/adaptive equipment instruction;Functional mobility training;Neuromuscular re-education;Pain management;Patient/family education;Psychosocial support;Self Care/advanced ADL retraining;Skin care/wound managment;Splinting/orthotics;Therapeutic Activities;Therapeutic Exercise;UE/LE Strength taining/ROM;UE/LE Coordination activities;Visual/perceptual remediation/compensation OT Self Feeding Anticipated Outcome(s): Set-up- mod I OT Basic Self-Care Anticipated Outcome(s): Supervision- mod I OT Toileting Anticipated Outcome(s): Supervision- mod I OT Bathroom Transfers Anticipated Outcome(s): Supervision OT Recommendation Recommendations for Other Services: Vestibular eval Patient destination: Home Follow Up  Recommendations: Home health OT Equipment Recommended: To be determined   Skilled Therapeutic Intervention Pt seen for OT evla and ADL bathing/dressing session. Pt sitting up in w/c upon arrival, voicing increased fatigue, however, agreeable to tx session and bathing at shower level. She ambulated throughout room with HHA to gather clothing items and ambulate into bathroom. She bathed seated on 3-1 BSC, noted to have R lean with dynamic sitting tasks. She dressed in w/c, standing at [facility] with steadying assist for clothing management. Required increased assist for controlled descent due to poor descentric control. Pt left seated in w/c at end of session, all needs in reach and friend present. Pt educated throughout session regarding role of OT, POC, OT/PT goals, ELOS, and d/c planning.   OT Evaluation Precautions/Restrictions  Precautions Precautions: Fall Restrictions Weight Bearing Restrictions: No General Chart Reviewed: Yes Pain Pain Assessment Pain Assessment: No/denies pain Home Living/Prior Functioning Home Living Family/patient expects to be discharged to:: Private residence Living Arrangements: Children Available Help at Discharge: Family Type of Home: House Home Access: Ramped entrance Home Layout: One level Bathroom Shower/Tub: Chiropodist: Handicapped height Bathroom Accessibility: Yes  Lives With: Daughter (2 daughters) IADL History Homemaking Responsibilities: Yes Current License: Yes Education: 5th grade education Occupation: Retired Leisure and Hobbies: church, cooking Prior Function Level of Independence: Independent with basic ADLs, Independent with homemaking with ambulation, Independent with gait, Independent with transfers  Able to Take Stairs?: Yes Driving: Yes Vocation: Retired Comments: Pt uses SPC as needed. Drives.  Vision Baseline Vision/History: Wears glasses Wears Glasses: Reading only Patient Visual Report: No change from  baseline Vision Assessment?: Yes;Vision impaired- to be further tested in functional context Ocular Range of Motion: Within Functional Limits Alignment/Gaze Preference: Within Defined Limits Tracking/Visual Pursuits: Impaired - to be further tested in functional context;Decreased smoothness of horizontal tracking;Decreased smoothness of vertical tracking Saccades: Decreased speed of saccadic movement;Overshoots Convergence: Within functional limits Visual Fields: No apparent deficits Diplopia Assessment: Only with left gaze Depth Perception: Overshoots Perception  Perception: Within Functional Limits Praxis Praxis: Intact Cognition Overall Cognitive  Status: Within Functional Limits for tasks assessed Arousal/Alertness: Awake/alert Orientation Level: Person;Place;Situation Person: Oriented Place: Oriented Situation: Oriented Year: 2018 Month: August Day of Week: Correct Memory: Appears intact Immediate Memory Recall: Sock;Blue;Bed Memory Recall: Sock;Blue;Bed Memory Recall Sock: Without Cue Memory Recall Blue: Without Cue Memory Recall Bed: Without Cue Attention: Sustained Sustained Attention: Appears intact Sustained Attention Impairment: Verbal complex Awareness: Appears intact Problem Solving: Appears intact Problem Solving Impairment: Functional complex (likely due to fatigue, task novelty, and baseline education) Safety/Judgment: Appears intact Sensation Sensation Light Touch: Appears Intact Hot/Cold: Appears Intact Proprioception: Appears Intact Coordination Gross Motor Movements are Fluid and Coordinated: Yes Fine Motor Movements are Fluid and Coordinated: No Finger Nose Finger Test: Decreased speed and accuracy Heel Shin Test: WNL Motor  Motor Motor: Ataxia Motor - Skilled Clinical Observations: Ataxia w/ functional movement, requires HHA or AD to prevent LOB Mobility  Bed Mobility Bed Mobility: Rolling Right;Rolling Left;Supine to Sit;Sit to  Supine Rolling Right: 5: Supervision Rolling Left: 5: Supervision Supine to Sit: 4: Min assist Supine to Sit Details: Verbal cues for sequencing;Verbal cues for technique Sit to Supine: 4: Min assist Sit to Supine - Details: Verbal cues for technique;Verbal cues for sequencing Transfers Sit to Stand: 4: Min guard  Trunk/Postural Assessment  Cervical Assessment Cervical Assessment: Exceptions to [employer] (Forward head; rounded shouders) Thoracic Assessment Thoracic Assessment: Exceptions to [facility] (Kyphotic) Lumbar Assessment Lumbar Assessment: Exceptions to [facility] (Posterior pelvic tilt) Postural Control Postural Control: Deficits on evaluation (R lean with dynamic sitting and static and dynamic standing tasks. )  Balance Balance Balance Assessed: Yes Static Sitting Balance Static Sitting - Balance Support: Feet supported Static Sitting - Level of Assistance: 5: Stand by assistance Dynamic Sitting Balance Dynamic Sitting - Balance Support: During functional activity;Feet supported;Right upper extremity supported Dynamic Sitting - Level of Assistance: 4: Min assist Dynamic Sitting Balance - Compensations: R lean onto armrest and wall during showering task Sitting balance - Comments: Sitting to complete bathing task Static Standing Balance Static Standing - Balance Support: During functional activity;Right upper extremity supported;Left upper extremity supported Static Standing - Level of Assistance: 4: Min assist Static Standing - Comment/# of Minutes: Standing to complete LB bathing/dressing tasks Dynamic Standing Balance Dynamic Standing - Balance Support: During functional activity;Right upper extremity supported;Left upper extremity supported Dynamic Standing - Level of Assistance: 4: Min assist Dynamic Standing - Balance Activities: Lateral lean/weight shifting;Forward lean/weight shifting;Reaching across midline Dynamic Standing - Comments: Standing to complete LB bathing/dressing  tasks Extremity/Trunk Assessment RUE Assessment RUE Assessment: Within Functional Limits LUE Assessment LUE Assessment: Exceptions to WFL LUE AROM (degrees) Overall AROM Left Upper Extremity: Within functional limits for tasks assessed LUE Strength LUE Overall Strength: Deficits LUE Overall Strength Comments: 4-/5 shoulder flexion; 4/5 all other joints   See Function Navigator for Current Functional Status.   Refer to Care Plan for Long Term Goals  Recommendations for other services: Therapeutic Recreation  Kitchen group and Outing/community reintegration   Discharge Criteria: Patient will be discharged from OT if patient refuses treatment 3 consecutive times without medical reason, if treatment goals not met, if there is a change in medical status, if patient makes no progress towards goals or if patient is discharged from hospital.  The above assessment, treatment plan, treatment alternatives and goals were discussed and mutually agreed upon: by patient  Ernestina Patches 03/11/2017, 12:57 PM

## 2017-03-11 NOTE — Progress Notes (Signed)
Ashley Chan PHYSICAL MEDICINE & REHABILITATION     PROGRESS NOTE    Subjective/Complaints: Had a reasonable night. Able to sleep. Still has some dizziness when she sits up. Denies nausea.   ROS: pt denies nausea, vomiting, diarrhea, cough, shortness of breath or chest pain   Objective: Vital Signs: Blood pressure (!) 150/59, pulse 77, temperature 98.8 F (37.1 C), temperature source Oral, resp. rate 16, height 5\' 6"  (1.676 m), weight 79 kg (174 lb 2.6 oz), SpO2 97 %. Ct Head Wo Contrast  Result Date: 03/10/2017 CLINICAL DATA:  Left cerebellar infarct EXAM: CT HEAD WITHOUT CONTRAST TECHNIQUE: Contiguous axial images were obtained from the base of the skull through the vertex without intravenous contrast. COMPARISON:  Several recent head CT examinations, most recent March 05, 2017; brain MRI March 03, 2017 FINDINGS: Brain: Mild generalized atrophy is stable. Recent left cerebellar infarct is again noted with decreased attenuation in this area. The previously noted hematoma in this area is no longer well-defined with less acute appearing hemorrhage and less well-defined hemorrhage in this area compared to most recent CT examination. There is more diffuse hemorrhage with cytotoxic edema in this area; there does not appear to be progression of the infarct itself, however. The edema abuts but does not efface the fourth ventricle ; fourth ventricle remains in the midline and appears unchanged compared to most recent studies. Quadrigeminal plate cisterns do not appear effaced and appears stable. Elsewhere, there is no mass or hemorrhage. No midline shift or extra-axial fluid collection. There is small vessel disease throughout the centra semiovale bilaterally, stable. No new infarct is demonstrable on this study. Vascular: There is no appreciable no hyperdense vessel. There is calcification in each carotid siphon region. Skull: Bony calvarium appears intact. There is pannus posterior to the odontoid. No  impression on the craniocervical junction. Sinuses/Orbits: There is mucosal thickening in several ethmoid air cells bilaterally. Other visualized paranasal sinuses are clear. Visualized orbits appear symmetric bilaterally. Other: Mastoid air cells are clear. IMPRESSION: 1. The left cerebellar infarct is again noted. There has been partial dissolution of hematoma within this acute infarct with more diffuse and vague appearing hemorrhage compared to recent study. Well-defined hematoma is no longer evident. The overall extent of this left cerebellar infarct is stable. There is cytotoxic edema in this area which abuts but does not efface the fourth ventricle. There is no effacement of quadrigeminal plate cisterns. Fourth ventricle is midline. 2. No new hemorrhage or new infarct evident. There is periventricular small vessel disease throughout the centra semiovale bilaterally, stable. There is no well-defined mass, midline shift, or extra-axial fluid collection. 3. There is calcification in each carotid siphon region. There is mucosal thickening in several ethmoid air cells. Electronically Signed   By: Lowella Grip III M.D.   On: 03/10/2017 13:35   Mr Cervical Spine Wo Contrast  Result Date: 03/09/2017 CLINICAL DATA:  Initial evaluation for spinal stenosis. EXAM: MRI CERVICAL SPINE WITHOUT CONTRAST TECHNIQUE: Multiplanar, multisequence MR imaging of the cervical spine was performed. No intravenous contrast was administered. COMPARISON:  Prior MRI from 06/07/2016. FINDINGS: Alignment: Straightening of the normal cervical lordosis. Trace anterolisthesis of C7 on T1. Vertebrae: Vertebral body heights maintained. No evidence for acute or chronic fracture. Signal intensity within the vertebral body bone marrow within normal limits. No discrete or worrisome osseous lesions. Cord: Subtle focus of T2 signal intensity within the right aspect of the cord at the level of C4-5 (series 7, image 16), most consistent with a  small  focus of myelomalacia. Signal intensity within the cervical spinal cord is otherwise normal. Posterior Fossa, vertebral arteries, paraspinal tissues: Evolving left cerebellar hemorrhage with associated mass effect noted, better evaluated on recent exams. Craniocervical junction normal. Posterior and paraspinous soft tissues demonstrate no acute abnormality. Lipoma within the left upper back noted. Normal intravascular flow voids present within the vertebral arteries bilaterally. Disc levels: C2-C3: Minimal disc bulge with bilateral uncovertebral spurring, greater on the left. Left-sided facet degeneration. Resultant moderate left C3 foraminal stenosis. No significant canal or right neural foraminal encroachment. C3-C4: Broad posterior disc protrusion flattens and effaces the ventral thecal sac. Secondary flattening of the cervical spinal cord without cord signal changes. Severe spinal stenosis with the thecal sac measuring 5-6 mm in AP diameter. Mild bilateral C4 foraminal stenosis. C4-C5: Diffuse degenerative disc bulge with intervertebral disc space narrowing and bilateral uncovertebral spurring. Broad multilobulated posterior disc protrusion, with largest component at the right central location (series 7, image 16). Protruding disc indents and flattens the ventral thecal sac, with flattening of the cervical spinal cord, greatest on the right. Cephalad and caudad migration of disc material noted (series 3, image 7). Superimposed ligamentum flavum thickening. Associated small focus of myelomalacia within the right aspect of the cord at this level (series 7, image 16). Severe spinal stenosis with the thecal sac measuring approximately 3 mm in AP diameter at its most narrow point. Severe right with mild to moderate left C5 foraminal stenosis. C5-C6: Broad left central disc protrusion indents and flattens the left ventral thecal sac (series 7, image 12). Flattening of the left hemi cord without cord signal  changes. Moderate to severe spinal stenosis with the thecal sac measuring 5 mm at its most narrow point. Mild bilateral C6 foraminal stenosis. C6-C7: Diffuse degenerative disc bulge with bilateral uncovertebral hypertrophy. Bulging disc flattens and partially faces the ventral thecal sac. Mild ligamentum flavum thickening. Mild cord flattening without cord signal changes. Moderate spinal stenosis. Severe left with moderate right C7 foraminal stenosis. C7-T1: Trace anterolisthesis. Bilateral facet degeneration, greater on the left. Minimal disc bulge. No significant spinal stenosis. Mild left C8 foraminal narrowing. Visualized upper thoracic spine within normal limits. IMPRESSION: 1. Advanced multilevel degenerative spondylolysis as above with resultant moderate to severe spinal stenosis at C3-4 through C6-7. Changes most severe at the C4-5 level. 2. Small focus of T2 hyperintensity within the right aspect of the cord at C4-5, most consistent with myelomalacia. 3. Multifactorial degenerative changes with multilevel foraminal narrowing as above. Notable findings include moderate left C3 foraminal stenosis, severe right with moderate left C5 foraminal stenosis, and severe left with moderate right C7 foraminal stenosis. 4. Evolving left cerebellar hemorrhagic infarct with associated edema, better evaluated on recent exams. Electronically Signed   By: Jeannine Boga M.D.   On: 03/09/2017 22:48    Recent Labs  03/09/17 0210 03/11/17 0522  WBC 8.7 7.0  HGB 11.9* 11.8*  HCT 35.1* 34.7*  PLT 265 331    Recent Labs  03/09/17 0210 03/11/17 0522  NA 138 140  K 3.9 4.0  CL 100* 105  GLUCOSE 137* 122*  BUN 16 15  CREATININE 0.89 0.86  CALCIUM 9.3 9.2   CBG (last 3)  No results for input(s): GLUCAP in the last 72 hours.  Wt Readings from Last 3 Encounters:  03/10/17 79 kg (174 lb 2.6 oz)  03/05/17 72.3 kg (159 lb 4.8 oz)    Physical Exam:  Constitutional: She is oriented to person, place,  and time. She appears well-developed  and well-nourished. No distress.  HENT:  Head: Normocephalic and atraumatic.  Mouth/Throat: Oropharynx is clear and moist.  Eyes: Pupils are equal, round, and reactive to light. Conjunctivae and EOM are normal.  Neck: Normal range of motion. Neck supple.  Cardiovascular: RRR without murmur. No JVD .   Respiratory:CTA Bilaterally without wheezes or rales. Normal effort   GI: Soft. Bowel sounds are normal. She exhibits no distension. There is no tenderness.  Musculoskeletal: She exhibits edema (min edema bilateral feet). She exhibits no tenderness.  Heels sl boggy   Neurological: She is alert and oriented to person, place, and time. A cranial nerve deficit is present.  Speech clear. Cognitively displays reasonable insight and awareness Few beats nystagmus horizontally during confrontation Able to follow basic commands without difficulty.  Motor: 5/5 grossly throughout RUE: Dysmetria is mild. LUE: Dysmetria and ataxia noted but able to hold fork and feed self.  Skin: Skin is warm and dry. She is not diaphoretic.  Psychiatric: pleasant and cooperative  Assessment/Plan: 1. Balance deficits/gait disorder secondary to left cerebellar infarct which require 3+ hours per day of interdisciplinary therapy in a comprehensive inpatient rehab setting. Physiatrist is providing close team supervision and 24 hour management of active medical problems listed below. Physiatrist and rehab team continue to assess barriers to discharge/monitor patient progress toward functional and medical goals.  Function:  Bathing Bathing position      Bathing parts      Bathing assist        Upper Body Dressing/Undressing Upper body dressing                    Upper body assist        Lower Body Dressing/Undressing Lower body dressing                                  Lower body assist        Toileting Toileting          Toileting assist      Transfers Chair/bed transfer             Locomotion Ambulation           Wheelchair          Cognition Comprehension Comprehension assist level: Follows basic conversation/direction with extra time/assistive device  Expression Expression assist level: Expresses basic needs/ideas: With extra time/assistive device  Social Interaction Social Interaction assist level: Interacts appropriately with others with medication or extra time (anti-anxiety, antidepressant).  Problem Solving    Memory     Medical Problem List and Plan: 1.  Balance deficits, gait abnormality, limitations in self-care secondary to left cerebellar infarct with hemorrhagic conversion.  -begin therapies today 2.  DVT Prophylaxis/Anticoagulation: Pharmaceutical: Lovenox 3. Pain Management: tylenol prn 4. Mood: LCSW to follow for evaluation and support.  5. Neuropsych: This patient is capable of making decisions on her own behalf. 6. Skin/Wound Care: routine pressure relief measures 7. Fluids/Electrolytes/Nutrition: Monitor I/O.  Marland Kitchen Push fluids to avoid orthostatic symptoms  -I personally reviewed the patient's labs today.    -seems to have reasonable appetite 8. PJK:DTOIZTI BP bid. Continue HCTZ, labetalol and Norvasc.  9. Colitis? with melena: offer fluids frequently to maintain adequate hydration. Monitor for recurrence and check H/H serially.   -hgb 11.8 today 10. Hypokalemia: Resolved with supplement. 4.0 today.  11. Dizziness:  vestibular eval ordered  -will need acclimation .  LOS (Days) 1 A Wataga T, MD 03/11/2017 9:35 AM

## 2017-03-11 NOTE — Progress Notes (Signed)
Social Work Assessment and Plan Social Work Assessment and Plan  Patient Details  Name: Ashley Chan MRN: [id number] Date of Birth: November 03, 1928  Today's Date: 03/11/2017  Problem List:  Patient Active Problem List   Diagnosis Date Noted  . Cerebellar stroke, acute ([place]) 03/10/2017  . Benign neoplasm of transverse colon   . Colon ulcer   . Neurologic gait disorder   . Hypokalemia   . Dizziness and giddiness   . Coffee ground emesis   . Heme + stool   . Duodenitis   . Benign essential HTN   . Chronic midline low back pain without sciatica   . Anxiety state   . Ataxia, post-stroke   . ICH (intracerebral hemorrhage) ([facility]) -  Left superior cerebellar hematoma with increased vasogenic edema and mass effect on adjacent fourth ventricle without hydrocephalus 03/03/2017  . CVA (cerebral vascular accident) ([place]) 03/03/2017   Past Medical History:  Past Medical History:  Diagnosis Date  . Anxiety   . Ataxia, post-stroke   . Back pain   . Chronic midline low back pain without sciatica   . CVA (cerebral vascular accident) ([place]) 03/03/2017  . Hypertension   . Right hip pain    Past Surgical History:  Past Surgical History:  Procedure Laterality Date  . BACK SURGERY  2011  . CATARACT EXTRACTION, BILATERAL  1999  . COLONOSCOPY WITH PROPOFOL N/A 03/10/2017   Procedure: COLONOSCOPY WITH PROPOFOL;  Surgeon: Ladene Artist, MD;  Location[facility] ENDOSCOPY;  Service: Endoscopy;  Laterality: N/A;  . ESOPHAGOGASTRODUODENOSCOPY N/A 03/09/2017   Procedure: ESOPHAGOGASTRODUODENOSCOPY (EGD);  Surgeon: [name], MD;  Location: [facility] ENDOSCOPY;  Service: Endoscopy;  Laterality: N/A;   Social History:  reports that she quit smoking about 55 years ago. Her smoking use included Cigarettes. She started smoking about 69 years ago. She has never used smokeless tobacco. She reports that she does not drink alcohol or use drugs.  Family / Support Systems Marital Status: Widow/Widower Patient Roles:  Parent Children: Ashley Chan [phone]  Ashley Chan [phone] Other Supports: Ashley Chan lives with pt Anticipated Caregiver: Daughter's Ability/Limitations of Caregiver: Ashley Chan needs B-THR and uses a walker, can only provide supervision level, Ashley Chan works first shift.  Caregiver Availability: Other (Comment) (Coming up with a plan awaiting progress) Family Dynamics: Very close family has seven living children of nine. Some are local while others are spread out but have visited and are supportive. Pt has always been independent and wants to get back to this level, she is not one to ask for assist.  Social History Preferred language: English Religion: Holiness Cultural Background: No issues Education: High School Read: Yes Write: Yes Employment Status: Retired Date Retired/Disabled/Unemployed: 28 years Freight forwarder Issues: No issues Guardian/Conservator: None-according to MD pt is capable of making her own decisions while here. Ashley Chan is staying with for support while here.   Abuse/Neglect Physical Abuse: Denies Verbal Abuse: Denies Sexual Abuse: Denies Exploitation of patient/patient's resources: Denies Self-Neglect: Denies  Emotional Status Pt's affect, behavior adn adjustment status: Pt is motivated to do well and is encoruaged by her progress already. She is moving her affected side more than she was. She has always been the caregiver of others, she raised nine children. Daughter feels if anyone can do it she can. Recent Psychosocial Issues: other health issues GI bleed besides CVA Pyschiatric History: History of anxiety takes medications for this and feels they help, she gets stressed aobut her grandchildren at times and their welfare. May benefit her  from seeing neuro-psych while here for coping. She is able to express her feelings and verbalize her concerns. Substance Abuse History: No issues  Patient / Family  Perceptions, Expectations & Goals Pt/Family understanding of illness & functional limitations: Pt and daughter can explain her stroke and GI issues. They talk with the MD daily and feel they have a good understanidng of her treatment plan from here on out. She is one who will do whatever she needs to do to improve. Premorbid pt/family roles/activities: Mother, grandmother, great grandmother, retiree, church member, etc Anticipated changes in roles/activities/participation: resume Pt/family expectations/goals: Pt states: " I want to be able to take care of myself before I go home. 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: None Premorbid Home Care/DME Agencies: None Transportation available at discharge: Family members Resource referrals recommended: Neuropsychology, Support group (specify)  Discharge Planning Living Arrangements: Children Support Systems: Children, Other relatives, Water engineer, Social worker community Type of Residence: Private residence Insurance Resources: [employer], Multimedia programmer (specify) Herbalist) Financial Resources: Fish farm manager, Other (Comment) (pension) Financial Screen Referred: No Living Expenses: Own Money Management: Patient, Family Does the patient have any problems obtaining your medications?: No Home Management: Pt now her daughter will assist with this. Patient/Family Preliminary Plans: Return home with daughter-Ashley Chan who can provide supervision level only due to using a walker herself. Her other daughter-Vanessa works first shift and is there in the evenings and can assist. If needed can hire someone to help wheil daughter is at work. Sw Barriers to Discharge: Lack of/limited family support Sw Barriers to Discharge Comments: Dauhgter using a walker due to her own health issues,can not provide any physical assist. 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 Daughter-Ashley Chan is very supportive and stays here with pt. Pt is hoping to get mod/i-supervision level with a walker, so can be home with Ashley Chan while Ashley Chan works. Will await team's evaluations and work on a safe plan for her. She may benefit from seeing neuro-psych while here due to anxiety issues. Provide support while here.  Elease Hashimoto 03/11/2017, 9:55 AM

## 2017-03-11 NOTE — Evaluation (Signed)
Physical Therapy Assessment and Plan  Patient Details  Name: Ashley Chan MRN: [id number] Date of Birth: 28-Mar-1929  PT Diagnosis: Abnormal posture, Abnormality of gait, Ataxia, Ataxic gait, Difficulty walking and Muscle weakness Rehab Potential: Good ELOS: 12-14 days   Today's Date: 03/11/2017 PT Individual Time: 8101-7510 PT Individual Time Calculation (min): 75 min    Problem List:  Patient Active Problem List   Diagnosis Date Noted  . Cerebellar stroke, acute ([place]) 03/10/2017  . Benign neoplasm of transverse colon   . Colon ulcer   . Neurologic gait disorder   . Hypokalemia   . Dizziness and giddiness   . Coffee ground emesis   . Heme + stool   . Duodenitis   . Benign essential HTN   . Chronic midline low back pain without sciatica   . Anxiety state   . Ataxia, post-stroke   . ICH (intracerebral hemorrhage) (HCC) -  Left superior cerebellar hematoma with increased vasogenic edema and mass effect on adjacent fourth ventricle without hydrocephalus 03/03/2017  . CVA (cerebral vascular accident) ([place]) 03/03/2017    Past Medical History:  Past Medical History:  Diagnosis Date  . Anxiety   . Ataxia, post-stroke   . Back pain   . Chronic midline low back pain without sciatica   . CVA (cerebral vascular accident) ([place]) 03/03/2017  . Hypertension   . Right hip pain    Past Surgical History:  Past Surgical History:  Procedure Laterality Date  . BACK SURGERY  2011  . CATARACT EXTRACTION, BILATERAL  1999  . COLONOSCOPY WITH PROPOFOL N/A 03/10/2017   Procedure: COLONOSCOPY WITH PROPOFOL;  Surgeon: Ladene Artist, MD;  Location: [facility] ENDOSCOPY;  Service: Endoscopy;  Laterality: N/A;  . ESOPHAGOGASTRODUODENOSCOPY N/A 03/09/2017   Procedure: ESOPHAGOGASTRODUODENOSCOPY (EGD);  Surgeon: [name], MD;  Location: [facility] ENDOSCOPY;  Service: Endoscopy;  Laterality: N/A;    Assessment & Plan Clinical Impression: Patient is a 83 y.o.left handed female who was admitted to OSH  with reports of dizziness, malaise and coffec ground emesis. History taken from chart review, patient, and family.  CT head unremarkable for acute process. Unable to get MRI brain due to the weekend. 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 She was transferred to [facility] for treatment and care. She was started medications for tighter BP control and required fluid boluses due to poor UOP. Repeat CT head 8/8 reviewed, stable midline shift. 2 D echo showed EF 60-655 with grade 2 diastolic dysfunction, moderate mitral valve calcification and moderate pulmonary HTN.  CTA head/neck showed severe left carotid bifurcation calcified plaque with severe 70% proximal L-ICA stenosis as well as thyroid nodules up to 15 mm. Repeat head CT 8/13 reviewed, with some improvement.  She did have bradycardic episode question due to vasovagal event as well as reports of purple/red stools.  Stool guaiacs heme positive and patient developed coffee ground emesis. Dr. Carlean Purl consulted for input as patient with history of gastric ulcers. EGD  done yesterday and showed duodenitis but rectal exam with hard stool and dark blood on digital exam.  She underwent colonoscopy today revealing ten mm ulcer in sigmoid colon with erythema and edema--bx taken, internal hems, two polyps--resected and mild diverticulosis.  GI recommended maintaining adequate hydration, avoiding NSAIDs x 2 weeks and suspected that ischemic colitis led to ulcer and heme positive stools. MRI C-spine reviewed, significant for degenerative changes and myelomalacia. No antithrombotic due to hemorrhage  per neurology  Notes. Therapy ongoing and patient with limitation in mobility and ability to carry out ADL tasks. CIR recommended by MD and rehab team for follow up therapy.Patient transferred to CIR on 03/10/2017 .   Patient currently requires mod with mobility secondary to muscle  weakness, decreased cardiorespiratoy endurance, ataxia, central origin (cerebellar hemorrhage) and decreased sitting balance, decreased standing balance, decreased postural control and decreased balance strategies.  Prior to hospitalization, patient was independent  with mobility and lived with Daughter (2 daughters) in a House home.  Home access is  Ramped entrance.  Patient will benefit from skilled PT intervention to maximize safe functional mobility, minimize fall risk and decrease caregiver burden for planned discharge home with 24 hour supervision.  Anticipate patient will benefit from follow up OP at discharge.  PT - End of Session Activity Tolerance: Tolerates 10 - 20 min activity with multiple rests Endurance Deficit: Yes Endurance Deficit Description: Decreased, increased work of breathing w/ functional activity  PT Assessment Rehab Potential (ACUTE/IP ONLY): Good PT Barriers to Discharge: Decreased caregiver support;Lack of/limited family support PT Barriers to Discharge Comments: Daughters unable to physically assist, however able to provide 24/7 supervision PT Patient demonstrates impairments in the following area(s): Balance;Endurance;Motor;Safety PT Transfers Functional Problem(s): Bed Mobility;Bed to Chair;Car;Furniture;Floor PT Locomotion Functional Problem(s): Ambulation;Wheelchair Mobility;Stairs PT Plan PT Intensity: Minimum of 1-2 x/day ,45 to 90 minutes PT Frequency: 5 out of 7 days PT Duration Estimated Length of Stay: 12-14 days PT Treatment/Interventions: Ambulation/gait training;Balance/vestibular training;Cognitive remediation/compensation;Community reintegration;Discharge planning;Neuromuscular re-education;Functional mobility training;DME/adaptive equipment instruction;Functional electrical stimulation;Disease management/prevention;Pain management;Patient/family education;Psychosocial support;Splinting/orthotics;UE/LE Coordination activities;UE/LE Strength  taining/ROM;Therapeutic Exercise;Therapeutic Activities;Stair training;Visual/perceptual remediation/compensation;Wheelchair propulsion/positioning PT Transfers Anticipated Outcome(s): Supervision PT Locomotion Anticipated Outcome(s): Supervision PT Recommendation Follow Up Recommendations: Home health PT;Outpatient PT (Will continue to assess safety w/ going to/from OPPT w/ someone to drive her) Patient destination: Home Equipment Recommended: To be determined  Skilled Therapeutic Intervention  Pt supine in bed upon arrival and agreeable to therapy, no c/o pain. Performed functional mobility tasks as detailed below w/ increased time and increased ataxia w/ all movements. PT instructed patient in PT Evaluation and initiated treatment intervention; see below for results. PT educated patient in [place], rehab potential, rehab goals, and discharge recommendations. Ended session in w/c w/ call bell within reach and all needs met.   PT Evaluation Precautions/Restrictions Precautions Precautions: Fall Restrictions Weight Bearing Restrictions: No Pain Pain Assessment Pain Assessment: No/denies pain Home Living/Prior Functioning Home Living Living Arrangements: Children Available Help at Discharge: Family Type of Home: House Home Access: Ramped entrance Home Layout: One level Bathroom Shower/Tub: Chiropodist: Handicapped height Bathroom Accessibility: Yes  Lives With: Daughter (2 daughters) Prior Function Level of Independence: Independent with basic ADLs;Independent with homemaking with ambulation;Independent with gait  Able to Take Stairs?: Yes Driving: Yes Vocation: Retired Comments: Pt uses SPC as needed. Drives.  Vision/Perception  Vision - Assessment Ocular Range of Motion: Within Functional Limits Alignment/Gaze Preference: Within Defined Limits Tracking/Visual Pursuits: Impaired - to be further tested in functional context (Decreased smoothness of movement in  all directions) Saccades: Decreased speed of saccadic movement;Overshoots Convergence: Within functional limits Diplopia Assessment: Only with left gaze (Pt reports double vision in L eye occasionally before stroke and more so after stroke, has since resolved) Perception Perception: Within Functional Limits Praxis Praxis: Intact  Cognition Overall Cognitive Status: Within Functional Limits for tasks assessed Arousal/Alertness: Awake/alert Orientation Level: Oriented X4 Attention: Sustained Sustained Attention: Appears intact Sustained Attention Impairment: Verbal complex Memory: Appears intact Awareness:  Appears intact Problem Solving: Appears intact Problem Solving Impairment: Functional complex (likely due to fatigue, task novelty, and baseline education) Safety/Judgment: Appears intact Sensation Sensation Light Touch: Appears Intact Proprioception: Appears Intact Coordination Gross Motor Movements are Fluid and Coordinated: Yes Fine Motor Movements are Fluid and Coordinated: No Finger Nose Finger Test: Decreased speed and overshoots Heel Shin Test: WNL Motor  Motor Motor: Ataxia Motor - Skilled Clinical Observations: Ataxia w/ functional movement, requires HHA or AD to prevent LOB  Mobility Bed Mobility Bed Mobility: Rolling Right;Rolling Left;Supine to Sit;Sit to Supine Rolling Right: 5: Supervision Rolling Left: 5: Supervision Supine to Sit: 4: Min assist Supine to Sit Details: Verbal cues for sequencing;Verbal cues for technique Sit to Supine: 4: Min assist Sit to Supine - Details: Verbal cues for technique;Verbal cues for sequencing Transfers Transfers: Yes Sit to Stand: 4: Min guard Stand Pivot Transfers: 4: Min assist Stand Pivot Transfer Details: Verbal cues for technique;Verbal cues for sequencing;Visual cues for safe use of DME/AE Locomotion  Ambulation Ambulation: Yes Ambulation/Gait Assistance: 4: Min assist Ambulation Distance (Feet): 60  Feet Assistive device: Rolling walker Ambulation/Gait Assistance Details: Verbal cues for gait pattern;Verbal cues for safe use of DME/AE;Verbal cues for precautions/safety Gait Gait: Yes Gait Pattern: Impaired Gait Pattern: Decreased stride length;Trunk flexed;Narrow base of support;Lateral trunk lean to right;Decreased trunk rotation;Shuffle;Ataxic Gait velocity: Decreased Stairs / Additional Locomotion Stairs: No (Will continue to assess) Product manager Mobility: Yes Wheelchair Assistance: 3: Mod assist Wheelchair Assistance Details: Verbal cues for technique;Verbal cues for sequencing;Manual facilitation for placement Wheelchair Propulsion: Both lower extermities;Both upper extremities Wheelchair Parts Management: Needs assistance Distance: 100'  Trunk/Postural Assessment  Cervical Assessment Cervical Assessment: Exceptions to [facility] (Forward head, rounded shoulder posture) Thoracic Assessment Thoracic Assessment: Exceptions to [facility] (Increased kyphsis) Lumbar Assessment Lumbar Assessment: Exceptions to [facility] (Posterior pelvic tilt ) Postural Control Postural Control: Deficits on evaluation (decreased postural control during functional mobility, mild R lateral lead)  Balance Balance Balance Assessed: Yes Static Sitting Balance Static Sitting - Balance Support: Bilateral upper extremity supported Static Sitting - Level of Assistance: 5: Stand by assistance Dynamic Sitting Balance Dynamic Sitting - Balance Support: During functional activity;Feet supported;Right upper extremity supported Dynamic Sitting - Level of Assistance: 4: Min assist Static Standing Balance Static Standing - Balance Support: No upper extremity supported Static Standing - Level of Assistance: 4: Min assist Dynamic Standing Balance Dynamic Standing - Balance Support: Bilateral upper extremity supported Dynamic Standing - Level of Assistance: 4: Min assist Dynamic Standing - Balance Activities:  Lateral lean/weight shifting;Forward lean/weight shifting;Reaching across midline Extremity Assessment  RUE Assessment RUE Assessment: Not tested (Defer to OT) LUE Assessment LUE Assessment: Not tested (Defer to OT) RLE Assessment RLE Assessment: Exceptions to [facility] (Grossly 4/5) LLE Assessment LLE Assessment: Exceptions to [facility] (Grossly 4/5)   See Function Navigator for Current Functional Status.   Refer to Care Plan for Long Term Goals  Recommendations for other services: Other: vestibular evaluation, PA-C already put in order  Discharge Criteria: Patient will be discharged from PT if patient refuses treatment 3 consecutive times without medical reason, if treatment goals not met, if there is a change in medical status, if patient makes no progress towards goals or if patient is discharged from hospital.  The above assessment, treatment plan, treatment alternatives and goals were discussed and mutually agreed upon: by patient and by family  Alexandre Faries K Arnette 03/11/2017, 10:54 AM

## 2017-03-11 NOTE — Evaluation (Signed)
Speech Language Pathology Assessment and Plan  Patient Details  Name: Ashley Chan MRN: [id number] Date of Birth: Dec 11, 1928    Today's Date: 03/11/2017 SLP Individual Time: 2841-3244 SLP Individual Time Calculation (min): 50 min   Problem List:  Patient Active Problem List   Diagnosis Date Noted  . Cerebellar stroke, acute ([place]) 03/10/2017  . Benign neoplasm of transverse colon   . Colon ulcer   . Neurologic gait disorder   . Hypokalemia   . Dizziness and giddiness   . Coffee ground emesis   . Heme + stool   . Duodenitis   . Benign essential HTN   . Chronic midline low back pain without sciatica   . Anxiety state   . Ataxia, post-stroke   . ICH (intracerebral hemorrhage) (HCC) -  Left superior cerebellar hematoma with increased vasogenic edema and mass effect on adjacent fourth ventricle without hydrocephalus 03/03/2017  . CVA (cerebral vascular accident) ([place]) 03/03/2017   Past Medical History:  Past Medical History:  Diagnosis Date  . Anxiety   . Ataxia, post-stroke   . Back pain   . Chronic midline low back pain without sciatica   . CVA (cerebral vascular accident) ([place]) 03/03/2017  . Hypertension   . Right hip pain    Past Surgical History:  Past Surgical History:  Procedure Laterality Date  . BACK SURGERY  2011  . CATARACT EXTRACTION, BILATERAL  1999  . COLONOSCOPY WITH PROPOFOL N/A 03/10/2017   Procedure: COLONOSCOPY WITH PROPOFOL;  Surgeon: Ladene Artist, MD;  Location: [facility] ENDOSCOPY;  Service: Endoscopy;  Laterality: N/A;  . ESOPHAGOGASTRODUODENOSCOPY N/A 03/09/2017   Procedure: ESOPHAGOGASTRODUODENOSCOPY (EGD);  Surgeon: [name], MD;  Location: [facility] ENDOSCOPY;  Service: Endoscopy;  Laterality: N/A;    Assessment / Plan / Recommendation Clinical Impression  Ashley G Carteris a 83 y.o.left handed female who was admitted to OSH with reports of dizziness, malaise and coffec ground emesis. History taken from chart review, patient, and family.  CT  head unremarkable for acute process. Unable to get MRI brain due to the weekend. 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 She was transferred to [facility] for treatment and care.  Repeat CT head 8/8 reviewed, stable midline shift. Repeat head CT 8/13 reviewed, with some improvement.  CIR recommended by MD and rehab team for follow up therapy. Patient cleared for admission today.  SLP evaluation was completed on 8/14 per verbal orders from MD and PA with the following results:  Pt presents with mild higher level cognitive impairments which are likely related to baseline education, normal age related changes, and fatigue during assessment. Pt and daughter both report that pt is at baseline for cognition.  As a result, no further ST needs are indicated at this time.    Skilled Therapeutic Interventions          Pt needed min assist verbal cues for medication and money management portions of the ALFA standardized cognitive assessment.  Both pt and daughter report that, while pt did manage her own medications and finances prior to admission, math was never pt's strength given her limited formal education (5th grade).  Discussed recommendations that pt have at least initial assistance for medication and financial management at discharge.  Pt's daughter reported that she will be able to provide recommended level of assistance and was intermittently helping pt with these types of tasks per pt request prior to admission.  All  questions were answered to their satisfaction at this time.    SLP Assessment  Patient does not need any further Speech Lanaguage Pathology Services    Recommendations  Follow up Recommendations: None Equipment Recommended: None recommended by SLP           Pain No/denies pain  Prior Functioning Cognitive/Linguistic Baseline: Within functional limits Type of Home: House  Lives With: Daughter (2  daughters) Available Help at Discharge: Family Education: 5th grade education Vocation: Retired  Function:  Eating Eating   Modified Consistency Diet: No Eating Assist Level: Set up assist for           Cognition Comprehension Comprehension assist level: Follows complex conversation/direction with extra time/assistive device  Expression   Expression assist level: Expresses complex ideas: With extra time/assistive device  Social Interaction Social Interaction assist level: Interacts appropriately with others with medication or extra time (anti-anxiety, antidepressant).  Problem Solving Problem solving assist level: Solves basic 75 - 89% of the time/requires cueing 10 - 24% of the time (due to lethargy and task novelty)  Memory Memory assist level: More than reasonable amount of time    Refer to Care Plan for Long Term Goals  Recommendations for other services: None   Discharge Criteria: Patient will be discharged from SLP if patient refuses treatment 3 consecutive times without medical reason, if treatment goals not met, if there is a change in medical status, if patient makes no progress towards goals or if patient is discharged from hospital.  The above assessment, treatment plan, treatment alternatives and goals were discussed and mutually agreed upon: by patient and by family  Emilio Math 03/11/2017, 12:43 PM

## 2017-03-12 ENCOUNTER — Inpatient Hospital Stay (HOSPITAL_COMMUNITY): Payer: No Typology Code available for payment source | Admitting: Occupational Therapy

## 2017-03-12 ENCOUNTER — Inpatient Hospital Stay (HOSPITAL_COMMUNITY): Payer: No Typology Code available for payment source

## 2017-03-12 ENCOUNTER — Encounter: Payer: Self-pay | Admitting: Gastroenterology

## 2017-03-12 LAB — URINALYSIS, ROUTINE W REFLEX MICROSCOPIC
Bilirubin Urine: NEGATIVE
GLUCOSE, UA: NEGATIVE mg/dL
HGB URINE DIPSTICK: NEGATIVE
Ketones, ur: NEGATIVE mg/dL
NITRITE: NEGATIVE
PROTEIN: NEGATIVE mg/dL
SPECIFIC GRAVITY, URINE: 1.008 (ref 1.005–1.030)
pH: 6 (ref 5.0–8.0)

## 2017-03-12 NOTE — Progress Notes (Signed)
Social Work Madia Carvell, Eliezer Champagne Social Worker Signed   Patient Care Conference Date of Service: 03/12/2017 12:34 PM      Hide copied text Hover for attribution information Inpatient RehabilitationTeam Conference and Plan of Care Update Date: 03/12/2017   Time: 10:45 AM      Patient Name: Ashley Chan      Medical Record Number: [id number]  Date of Birth: 03-Oct-1928 Sex: Female         Room/Bed: 4W23C/4W23C-01 Payor Info: Payor: MEDICARE / Plan: MEDICARE PART A AND B / Product Type: *No Product type* /     Admitting Diagnosis: CVA  Admit Date/Time:  03/10/2017  4:46 PM Admission Comments: No comment available    Primary Diagnosis:  <principal problem not specified> Principal Problem: <principal problem not specified>       Patient Active Problem List    Diagnosis Date Noted  . Cerebellar stroke, acute ([place]) 03/10/2017  . Benign neoplasm of transverse colon    . Colon ulcer    . Neurologic gait disorder    . Hypokalemia    . Dizziness and giddiness    . Coffee ground emesis    . Heme + stool    . Duodenitis    . Benign essential HTN    . Chronic midline low back pain without sciatica    . Anxiety state    . Ataxia, post-stroke    . ICH (intracerebral hemorrhage) (HCC) -  Left superior cerebellar hematoma with increased vasogenic edema and mass effect on adjacent fourth ventricle without hydrocephalus 03/03/2017  . CVA (cerebral vascular accident) ([place]) 03/03/2017      Expected Discharge Date: Expected Discharge Date: 03/25/17   Team Members Present: Physician leading conference: Dr. Alysia Penna Social Worker Present: Ovidio Kin, LCSW Nurse Present: Elliot Cousin, RN PT Present: Leavy Cella, PT;Rosita Dechalus, PTA OT Present: Napoleon Form, OT SLP Present: Windell Moulding, SLP PPS Coordinator present : Daiva Nakayama, RN, CRRN       Current Status/Progress Goal Weekly Team Focus  Medical     left hemiataxia, cognition intact  maintain med stability  initiating  therapy program   Bowel/Bladder     cont of b/b; lbm 8/13; urgency  maintain continence during rehab stay  monitor b/b continence q shift and prn   Swallow/Nutrition/ Hydration               ADL's     Min-mod A overall  Supervision- mod I overall  Neuro re-ed, ADL re-training, functional sitting/ standing balance, activity tolerance   Mobility     Mod A w/c, Min A transfers and gait, ataxic w/ all mobility   supevision gait/transfers, Mod I bed mobility and w/c  balance, ataxia, functional mobility, endurance, LE strength    Communication               Safety/Cognition/ Behavioral Observations             Pain     denies pain  <2 out of 10 duirng rehab stay  assess pain q shift and prn   Skin     edema to BLE; boggy heels; prevalon boots; eucerin cream for bilat feet  skin free of infection and breakdown during rehab stay  assess skin q shift and prn     *See Care Plan and progress notes for long and short-term goals.      Barriers to Discharge   Current Status/Progress Possible Resolutions Date Resolved   Physician  Medical stability;Other (comments)  vestibular symptoms, constipation , urine is cloudy  initiating rehab, no SLP needs identified  manage bowel and bladder      Nursing                 PT  Decreased caregiver support;Lack of/limited family support  Daughters unable to physically assist, however able to provide 24/7 supervision              OT                 SLP            SW Lack of/limited family support Dauhgter using a walker due to her own health issues,can not provide any physical assist.             Discharge Planning/Teaching Needs:  HOme with daughter who uses a walker due to needing B-THR, so only can provide supervision level while other daughter works. Daughter stays here with her.      Team Discussion:  Goals supervision-mod/i level. Vertigo main issue and will have vestibular evaluation today. Speech evaluation and no need to follow here.  Urgency with bladder, tolieting frequently. MD checking UA per RN request. Ataxia per PT, progressing already in therapies.  Revisions to Treatment Plan:  DC 8/28    Continued Need for Acute Rehabilitation Level of Care: The patient requires daily medical management by a physician with specialized training in physical medicine and rehabilitation for the following conditions: Daily direction of a multidisciplinary physical rehabilitation program to ensure safe treatment while eliciting the highest outcome that is of practical value to the patient.: Yes Daily medical management of patient stability for increased activity during participation in an intensive rehabilitation regime.: Yes Daily analysis of laboratory values and/or radiology reports with any subsequent need for medication adjustment of medical intervention for : Neurological problems;Urological problems   Elease Hashimoto 03/12/2017, 12:34 PM           Patient ID: Ashley Chan, female   DOB: [birth year], 83 y.o.   MRN: [id number]

## 2017-03-12 NOTE — Progress Notes (Signed)
Occupational Therapy Session Note  Patient Details  Name: MYLEA ROARTY MRN: [id number] Date of Birth: 11/24/1928  Today's Date: 03/12/2017 OT Individual Time: 7199-4129 OT Individual Time Calculation (min): 30 min    Short Term Goals: Week 1:  OT Short Term Goal 1 (Week 1): Pt will complete 3/3 toileting tasks with steadying assist OT Short Term Goal 2 (Week 1): Pt will complete 2 grooming tasks in standing with steadying assist in order to increase functional activity tolerance OT Short Term Goal 3 (Week 1): Pt will dress UB with set-up assist.  OT Short Term Goal 4 (Week 1): Pt will use L UE at non-dominant level with min cuing for initiation  Skilled Therapeutic Interventions/Progress Updates:    OT treatment session focused on vestibular exercises and L UE coordination. Pt completed 2 sets of vestibular there-ex of 5 slow sit<>stands, and bending forward 5x to pick objects up off of floor Pt unable to tolerate bending all the way forward to the floor, so activity graded to reach objects held by therapist at shin height. Incorporated L UE coordination with reaching with L UE only to grasp objects. Pt reported need for bathroom and ambulated to bathroom with Min A and VC for safe use of RW. Pt voided bladder and completed 3/3 toileting steps with min A for balance w/o UE support. Pt washed hands at sink with min guard A for balance and VC for RW positioning, then returned to bed at end of session and left with bed alarm on and needs met.   Therapy Documentation Precautions:  Precautions Precautions: Fall Restrictions Weight Bearing Restrictions: No Pain:  none/denies pain  See Function Navigator for Current Functional Status.   Therapy/Group: Individual Therapy  Valma Cava 03/12/2017, 3:23 PM

## 2017-03-12 NOTE — Progress Notes (Signed)
[place] PHYSICAL MEDICINE & REHABILITATION     PROGRESS NOTE    Subjective/Complaints:  No issues overnite son at bedside. Discussed rehab stay in overall plan Some dizziness with movement,   ROS: pt denies nausea, vomiting, diarrhea, cough, shortness of breath or chest pain   Objective: Vital Signs: Blood pressure (!) 136/54, pulse 79, temperature 98.3 F (36.8 C), temperature source Oral, resp. rate 16, height _0  (1.676 m), weight 79 kg (174 lb 2.6 oz), SpO2 99 %. Ct Head Wo Contrast  Result Date: 03/10/2017 CLINICAL DATA:  Left cerebellar infarct EXAM: CT HEAD WITHOUT CONTRAST TECHNIQUE: Contiguous axial images were obtained from the base of the skull through the vertex without intravenous contrast. COMPARISON:  Several recent head CT examinations, most recent March 05, 2017; brain MRI March 03, 2017 FINDINGS: Brain: Mild generalized atrophy is stable. Recent left cerebellar infarct is again noted with decreased attenuation in this area. The previously noted hematoma in this area is no longer well-defined with less acute appearing hemorrhage and less well-defined hemorrhage in this area compared to most recent CT examination. There is more diffuse hemorrhage with cytotoxic edema in this area; there does not appear to be progression of the infarct itself, however. The edema abuts but does not efface the fourth ventricle ; fourth ventricle remains in the midline and appears unchanged compared to most recent studies. Quadrigeminal plate cisterns do not appear effaced and appears stable. Elsewhere, there is no mass or hemorrhage. No midline shift or extra-axial fluid collection. There is small vessel disease throughout the centra semiovale bilaterally, stable. No new infarct is demonstrable on this study. Vascular: There is no appreciable no hyperdense vessel. There is calcification in each carotid siphon region. Skull: Bony calvarium appears intact. There is pannus posterior to the odontoid.  No impression on the craniocervical junction. Sinuses/Orbits: There is mucosal thickening in several ethmoid air cells bilaterally. Other visualized paranasal sinuses are clear. Visualized orbits appear symmetric bilaterally. Other: Mastoid air cells are clear. IMPRESSION: 1. The left cerebellar infarct is again noted. There has been partial dissolution of hematoma within this acute infarct with more diffuse and vague appearing hemorrhage compared to recent study. Well-defined hematoma is no longer evident. The overall extent of this left cerebellar infarct is stable. There is cytotoxic edema in this area which abuts but does not efface the fourth ventricle. There is no effacement of quadrigeminal plate cisterns. Fourth ventricle is midline. 2. No new hemorrhage or new infarct evident. There is periventricular small vessel disease throughout the centra semiovale bilaterally, stable. There is no well-defined mass, midline shift, or extra-axial fluid collection. 3. There is calcification in each carotid siphon region. There is mucosal thickening in several ethmoid air cells. Electronically Signed   By: Lowella Grip III M.D.   On: 03/10/2017 13:35    Recent Labs  03/11/17 0522  WBC 7.0  HGB 11.8*  HCT 34.7*  PLT 331    Recent Labs  03/11/17 0522  NA 140  K 4.0  CL 105  GLUCOSE 122*  BUN 15  CREATININE 0.86  CALCIUM 9.2   CBG (last 3)  No results for input(s): GLUCAP in the last 72 hours.  Wt Readings from Last 3 Encounters:  03/10/17 79 kg (174 lb 2.6 oz)  03/05/17 72.3 kg (159 lb 4.8 oz)    Physical Exam:  Constitutional: She is oriented to person, place, and time. She appears well-developed and well-nourished. No distress.  HENT:  Head: Normocephalic and atraumatic.  Mouth/Throat:  Oropharynx is clear and moist.  Eyes: Pupils are equal, round, and reactive to light. Conjunctivae and EOM are normal.  Neck: Normal range of motion. Neck supple.  Cardiovascular: RRR without  murmur. No JVD .   Respiratory:CTA Bilaterally without wheezes or rales. Normal effort   GI: Soft. Bowel sounds are normal. She exhibits no distension. There is no tenderness.  Musculoskeletal: She exhibits edema (min edema bilateral feet). She exhibits no tenderness.  Heels sl boggy   Neurological: She is alert and oriented to person, place, and time. A cranial nerve deficit is present.  Speech clear. Cognitively displays reasonable insight and awareness Few beats nystagmus horizontally during confrontation Able to follow basic commands without difficulty.  Motor: 5/5 grossly throughout RUE: Dysmetria is mild. LUE: Dysmetria and ataxia noted but able to hold fork and feed self.  Skin: Skin is warm and dry. She is not diaphoretic.  Psychiatric: pleasant and cooperative  Assessment/Plan: 1. Balance deficits/gait disorder secondary to left cerebellar infarct which require 3+ hours per day of interdisciplinary therapy in a comprehensive inpatient rehab setting. Physiatrist is providing close team supervision and 24 hour management of active medical problems listed below. Physiatrist and rehab team continue to assess barriers to discharge/monitor patient progress toward functional and medical goals.  Function:  Bathing Bathing position   Position: Shower  Bathing parts Body parts bathed by patient: Right arm, Left arm, Chest, Abdomen, Front perineal area, Buttocks, Right upper leg, Left upper leg Body parts bathed by helper: Right lower leg, Left lower leg, Back  Bathing assist Assist Level: Touching or steadying assistance(Pt > 75%)      Upper Body Dressing/Undressing Upper body dressing   What is the patient wearing?: Button up shirt         Button up shirt - Perfomed by patient: Thread/unthread right sleeve, Thread/unthread left sleeve Button up shirt - Perfomed by helper: Pull shirt around back, Button/unbutton shirt    Upper body assist        Lower Body  Dressing/Undressing Lower body dressing   What is the patient wearing?: Pants, Non-skid slipper socks     Pants- Performed by patient: Thread/unthread right pants leg, Thread/unthread left pants leg, Pull pants up/down     Non-skid slipper socks- Performed by helper: Don/doff right sock, Don/doff left sock                  Lower body assist Assist for lower body dressing: Touching or steadying assistance (Pt > 75%)      Toileting Toileting   Toileting steps completed by patient: Adjust clothing prior to toileting, Performs perineal hygiene, Adjust clothing after toileting Toileting steps completed by helper: Adjust clothing prior to toileting, Adjust clothing after toileting Toileting Assistive Devices: Grab bar or rail  Toileting assist Assist level: Set up/obtain supplies, More than reasonable time   Transfers Chair/bed transfer   Chair/bed transfer method: Stand pivot Chair/bed transfer assist level: Touching or steadying assistance (Pt > 75%) Chair/bed transfer assistive device: Armrests, Walker     Locomotion Ambulation     Max distance: 60' Assist level: Touching or steadying assistance (Pt > 75%)   Wheelchair   Type: Manual Max wheelchair distance: 100' Assist Level: Moderate assistance (Pt 50 - 74%)  Cognition Comprehension Comprehension assist level: Follows complex conversation/direction with extra time/assistive device  Expression Expression assist level: Expresses complex 90% of the time/cues < 10% of the time  Social Interaction Social Interaction assist level: Interacts appropriately with others with medication or  extra time (anti-anxiety, antidepressant).  Problem Solving Problem solving assist level: Solves basic 75 - 89% of the time/requires cueing 10 - 24% of the time  Memory Memory assist level: Recognizes or recalls 90% of the time/requires cueing < 10% of the time   Medical Problem List and Plan: 1.  Balance deficits, gait abnormality,  limitations in self-care secondary to left cerebellar infarct with hemorrhagic conversion.  -Team conference today please see physician documentation under team conference tab, met with team face-to-face to discuss problems,progress, and goals. Formulized individual treatment plan based on medical history, underlying problem and comorbidities. 2.  DVT Prophylaxis/Anticoagulation: Pharmaceutical: Lovenox, plt normal 8/14 3. Pain Management: tylenol prn 4. Mood: LCSW to follow for evaluation and support.  5. Neuropsych: This patient is capable of making decisions on her own behalf. 6. Skin/Wound Care: routine pressure relief measures 7. Fluids/Electrolytes/Nutrition: Monitor I/O.  Marland Kitchen Push fluids to avoid orthostatic symptoms  -I personally reviewed the patient's labs today.    -seems to have reasonable appetite 8. DGL:OVFIEPP BP bid. Continue HCTZ, labetalol and Norvasc.  9. Colitis? with melena: offer fluids frequently to maintain adequate hydration. Monitor for recurrence and check H/H serially.   -hgb 11.8 8/14 10. Hypokalemia: Resolved with supplement. K+ 4.0 8/14  11. Dizziness:  vestibular eval ordered  -will need acclimation .     LOS (Days) 2 A FACE TO FACE EVALUATION WAS PERFORMED  Charlett Blake, MD 03/12/2017 8:46 AM

## 2017-03-12 NOTE — Patient Care Conference (Signed)
Inpatient RehabilitationTeam Conference and Plan of Care Update Date: 03/12/2017   Time: 10:45 AM    Patient Name: Ashley Chan      Medical Record Number: [id number]  Date of Birth: 1929-07-23 Sex: Female         Room/Bed: 4W23C/4W23C-01 Payor Info: Payor: MEDICARE / Plan: MEDICARE PART A AND B / Product Type: *No Product type* /    Admitting Diagnosis: CVA  Admit Date/Time:  03/10/2017  4:46 PM Admission Comments: No comment available   Primary Diagnosis:  <principal problem not specified> Principal Problem: <principal problem not specified>  Patient Active Problem List   Diagnosis Date Noted  . Cerebellar stroke, acute ([facility]) 03/10/2017  . Benign neoplasm of transverse colon   . Colon ulcer   . Neurologic gait disorder   . Hypokalemia   . Dizziness and giddiness   . Coffee ground emesis   . Heme + stool   . Duodenitis   . Benign essential HTN   . Chronic midline low back pain without sciatica   . Anxiety state   . Ataxia, post-stroke   . ICH (intracerebral hemorrhage) ([facility]) -  Left superior cerebellar hematoma with increased vasogenic edema and mass effect on adjacent fourth ventricle without hydrocephalus 03/03/2017  . CVA (cerebral vascular accident) ([facility]) 03/03/2017    Expected Discharge Date: Expected Discharge Date: 03/25/17  Team Members Present: Physician leading conference: Dr. Alysia Penna Social Worker Present: Ovidio Kin, LCSW Nurse Present: Elliot Cousin, RN PT Present: Leavy Cella, PT;Rosita Dechalus, PTA OT Present: Napoleon Form, OT SLP Present: Windell Moulding, SLP PPS Coordinator present : Daiva Nakayama, RN, CRRN     Current Status/Progress Goal Weekly Team Focus  Medical   left hemiataxia, cognition intact  maintain med stability  initiating therapy program   Bowel/Bladder   cont of b/b; lbm 8/13; urgency  maintain continence during rehab stay  monitor b/b continence q shift and prn   Swallow/Nutrition/ Hydration             ADL's   Min-mod A  overall  Supervision- mod I overall  Neuro re-ed, ADL re-training, functional sitting/ standing balance, activity tolerance   Mobility   Mod A w/c, Min A transfers and gait, ataxic w/ all mobility   supevision gait/transfers, Mod I bed mobility and w/c  balance, ataxia, functional mobility, endurance, LE strength    Communication             Safety/Cognition/ Behavioral Observations            Pain   denies pain  <2 out of 10 duirng rehab stay  assess pain q shift and prn   Skin   edema to BLE; boggy heels; prevalon boots; eucerin cream for bilat feet  skin free of infection and breakdown during rehab stay  assess skin q shift and prn      *See Care Plan and progress notes for long and short-term goals.     Barriers to Discharge  Current Status/Progress Possible Resolutions Date Resolved   Physician    Medical stability;Other (comments)  vestibular symptoms, constipation , urine is cloudy  initiating rehab, no SLP needs identified  manage bowel and bladder      Nursing                  PT  Decreased caregiver support;Lack of/limited family support  Daughters unable to physically assist, however able to provide 24/7 supervision  OT                  SLP                SW Lack of/limited family support Dauhgter using a walker due to her own health issues,can not provide any physical assist.            Discharge Planning/Teaching Needs:  HOme with daughter who uses a walker due to needing B-THR, so only can provide supervision level while other daughter works. Daughter stays here with her.      Team Discussion:  Goals supervision-mod/i level. Vertigo main issue and will have vestibular evaluation today. Speech evaluation and no need to follow here. Urgency with bladder, tolieting frequently. MD checking UA per RN request. Ataxia per PT, progressing already in therapies.  Revisions to Treatment Plan:  DC 8/28    Continued Need for Acute Rehabilitation Level of  Care: The patient requires daily medical management by a physician with specialized training in physical medicine and rehabilitation for the following conditions: Daily direction of a multidisciplinary physical rehabilitation program to ensure safe treatment while eliciting the highest outcome that is of practical value to the patient.: Yes Daily medical management of patient stability for increased activity during participation in an intensive rehabilitation regime.: Yes Daily analysis of laboratory values and/or radiology reports with any subsequent need for medication adjustment of medical intervention for : Neurological problems;Urological problems  Elease Hashimoto 03/12/2017, 12:34 PM

## 2017-03-12 NOTE — Progress Notes (Signed)
Physical Therapy Session Note/Vestibular Evaluation  Patient Details  Name: Ashley Chan MRN: [id number] Date of Birth: 14-Oct-1928  Today's Date: 03/12/2017 PT Individual Time: 1302-1405 PT Individual Time Calculation (min): 63 min   Short Term Goals: Week 1:  PT Short Term Goal 1 (Week 1): Pt will ambulate 100' w/ Min A using LRAD PT Short Term Goal 2 (Week 1): Pt will negotiate 4 steps w/ Mod A PT Short Term Goal 3 (Week 1): Pt will all bed mobility w/ supervision and w/o handrail use or HOB elevated  PT Short Term Goal 4 (Week 1): Pt will transfer w/ Min guard via stand pivot transfer using LRAD PT Short Term Goal 5 (Week 1): Pt will maintain dynamic sitting balance w/ supervision  Skilled Therapeutic Interventions/Progress Updates:    Patient in bed with family present, assisted to EOB with min A for trunk support.  Sit to stand min A and ambulated 160' with RW min A for trunk steadiness/coordination due to ataxia.  Patient seated edge of mat leaning R with R head tilt.  Completed vestibular assessment as noted below.  Educated pt on habituation therapy.  She reports already utilizing targeting technique for reduction of symptoms during mobility and this was encouraged throughout ambulation.  Patient also reported improved symptoms over past two days without nausea (until vestibular assessment this pm.)  But still feels unsteady on her feet.  Did noted decreased coordination L UE and LE in sitting as well as saccadic smooth pursuits and jerky movements with saccades all indicating central cause for her symptoms.  Completed motion sensitivity testing and noted moderate to min symptoms with forward bending and sit to stand.  Both issued for HEP and demonstrated.  Discussed plan to utilize in therapy and progress when closer to home for HEP with daughter assistance.  Patient left in supine with bed alarm set and call bell in reach end of session.    Therapy Documentation Precautions:   Precautions Precautions: Fall Restrictions Weight Bearing Restrictions: No Pain: Pain Assessment Faces Pain Scale: No hurt   Other Treatments:        Vestibular Assessment - 03/12/17 0001      Vestibular Assessment   General Observation Patient relates a "rocky feeling" that began when she had this stroke.  She relates no symptoms lying down, but exacerbated when rising, rolling or walking.  She had L eye double vision ever since removal of scar tissue on posterior aspect years ago.  She wears glasses (bifocals) for reading or when going out.  States no hearing changes, but feels very unsteady on her feet.     Symptom Behavior   Type of Dizziness Imbalance  Rocky feeling   Frequency of Dizziness intermittent   Duration of Dizziness minutes   Aggravating Factors Sit to stand;Supine to sit;Turning body quickly   Relieving Factors Lying supine;Rest;Slow movements     Occulomotor Exam   Occulomotor Alignment Normal   Spontaneous Absent   Gaze-induced Absent   Head shaking Horizontal Absent   Smooth Pursuits Saccades   Saccades Poor trajectory  jumpy at end worse on L     Vestibulo-Occular Reflex   VOR 1 Head Only (x 1 viewing) tolerated only 20 s horizontal with symptoms of nausea; minmal symptoms vertical     Auditory   Comments intact and equal to scratch test bilaterally     Positional Testing   Dix-Hallpike Dix-Hallpike Right     Dix-Hallpike Right   Dix-Hallpike Right Duration unable to  tolerate getting into position like would throw up   Dix-Hallpike Right Symptoms Other (comment)  nausea     Positional Sensitivities   Sit to Supine No dizziness   Supine to Left Side No dizziness   Supine to Right Side No dizziness   Supine to Sitting Moderate dizziness   Right Hallpike Vomiting   Up from Right Hallpike Vomiting   Up from Left Hallpike --  NT   Nose to Right Knee Lightheadedness   Right Knee to Sitting No dizziness   Nose to Left Knee Lightheadedness    Left Knee to Sitting Mild dizziness   Head Turning x 5 Moderate dizziness   Head Nodding x 5 No dizziness   Pivot Right in Standing Lightheadedness   Pivot Left in Standing Mild dizziness   Rolling Right No dizziness   Rolling Left No dizziness       See Function Navigator for Current Functional Status.   Therapy/Group: Individual Therapy  Reginia Naas  [place], [place] 03/12/2017  03/12/2017, 5:52 PM

## 2017-03-12 NOTE — Progress Notes (Signed)
Occupational Therapy Session Note  Patient Details  Name: Ashley Chan MRN: [id number] Date of Birth: [birth year]  Today's Date: 03/12/2017 OT Individual Time: 0930-1030 and 1430-1500 OT Individual Time Calculation (min): 60 min and 30 min   Short Term Goals: Week 1:  OT Short Term Goal 1 (Week 1): Pt will complete 3/3 toileting tasks with steadying assist OT Short Term Goal 2 (Week 1): Pt will complete 2 grooming tasks in standing with steadying assist in order to increase functional activity tolerance OT Short Term Goal 3 (Week 1): Pt will dress UB with set-up assist.  OT Short Term Goal 4 (Week 1): Pt will use L UE at non-dominant level   Skilled Therapeutic Interventions/Progress Updates:    Session One: Pt seen for OT ADL bathing/dressing session. Pt sitting up in w/c upon arrival with son present, agreeable to tx session.  She ambulated throughout session with RW and min A, VCs for RW management. Toileting task completed with steadying assist.  She bathed seated on 3-1 BSC, demonstrating improved static and dynamic sitting balance today compared to previous session.  She returned to w/c to dress, able to fasten bra with increased time and steadying assist to pull pants up. Pt able to cross ankle over knee to don lotion and socks. Completed functional reaching task with L UE with decreased coordination though able to complete with increased time. Pt cont with reports of dizziness, however, reports it is getting better since admission. She required rest breaks throughout session due to decreased functional activity tolerance.  Pt requested return to bed at end of session, left in supine, all needs in reach.   Session Two: Pt seen for OT session focusing on neuro-muscular re-education and control with L UE. Pt in supine upon arrival, agreeable to tx session. She transferred to EOB with supervision using hospital bed functions, min A stand pivot to w/c. Completed dynavision activity in  seated and standing positions focusing on controlled movements and finger isolation control with L UE. CGA for dynamic standing balance. With increased time and effort, pt able to accurately hit targets with single finger movement. Pt returned to room at end of session, left seated in w/c with son present awaiting next therapist.   Therapy Documentation Precautions:  Precautions Precautions: Fall Restrictions Weight Bearing Restrictions: No Pain:   No/ denies painn  See Function Navigator for Current Functional Status.   Therapy/Group: Individual Therapy  Lewis, Eilyn Polack C 03/12/2017, 7:11 AM

## 2017-03-12 NOTE — Patient Instructions (Addendum)
Sit to Stand: Head Upright    With head upright, stand up slowly with eyes open. Repeat _5___ times per session. Do _3___ sessions per day.  Copyright  VHI. All rights reserved.  Bending / Picking Up Objects    Sitting, slowly bend head down and pick up object on the floor. Return to upright position. Hold position until symptoms subside. Repeat _5___ times per session. Do _5___ sessions per day.  Copyright  VHI. All rights reserved.  Feet Together, Varied Arm Positions - Eyes Closed    Stand with feet together and at sides. Close eyes and visualize upright position. Hold __30__ seconds. Repeat __2__ times per session. Do __2__ sessions per day.  Copyright  VHI. All rights reserved.  Feet Apart, Head Motion - Eyes Open    With eyes open, feet apart, move head slowly: up and down. Repeat __10__ times per session. Do _2___ sessions per day.  Copyright  VHI. All rights reserved.  Tip Card  1.The goal of habituation training is to assist in decreasing symptoms of vertigo, dizziness, or nausea provoked by specific head and body motions. 2.These exercises may initially increase symptoms; however, be persistent and work through symptoms. With repetition and time, the exercises will assist in reducing or eliminating symptoms. 3.Exercises should be stopped and discussed with the therapist if you experience any of the following: - Sudden change or fluctuation in hearing - New onset of ringing in the ears, or increase in current intensity - Any fluid discharge from the ear - Severe pain in neck or back - Extreme nausea  Copyright  VHI. All rights reserved.  General Safety Tip Card    1.For safety, all exercises must be performed close to a support (wall, countertop, person, etc.) or in a corner with back of chair in front. 2.Only perform those exercises as instructed by the therapist. If instructions are not clearly understood, wait for clarification by therapist before  attempting to perform.  Copyright  VHI. All rights reserved.

## 2017-03-12 NOTE — Progress Notes (Signed)
Social Work Patient ID: Ashley Chan, female   DOB: 1929/03/13, 83 y.o.   MRN: [id number]  Met with pt, daughter and son who are here to discuss team conference goals superivison-mod/i level. Main issue is her dizziness and if can improve this issue pt will be doing well. Target discharge date is 8/28, pt states: " [birth year]." Daughter voiced this will work out well. Family aware of  Pt's need for 24 hr supervision at discharge and will arrange for this.

## 2017-03-13 ENCOUNTER — Inpatient Hospital Stay (HOSPITAL_COMMUNITY): Payer: No Typology Code available for payment source | Admitting: Occupational Therapy

## 2017-03-13 ENCOUNTER — Inpatient Hospital Stay (HOSPITAL_COMMUNITY): Payer: No Typology Code available for payment source | Admitting: Physical Therapy

## 2017-03-13 ENCOUNTER — Inpatient Hospital Stay (HOSPITAL_COMMUNITY): Payer: No Typology Code available for payment source

## 2017-03-13 DIAGNOSIS — I69993 Ataxia following unspecified cerebrovascular disease: Secondary | ICD-10-CM

## 2017-03-13 LAB — URINE CULTURE: CULTURE: NO GROWTH

## 2017-03-13 NOTE — Progress Notes (Signed)
PHYSICAL MEDICINE & REHABILITATION     PROGRESS NOTE    Subjective/Complaints:  Patient doesn't feel great this morning, slept poorly due to probable on boots. Nursing indicates that purple on boots were placed because of "boggy heels", patient denies any heel pain  ROS: pt denies nausea, vomiting, diarrhea, cough, shortness of breath or chest pain   Objective: Vital Signs: Blood pressure (!) 168/80, pulse 86, temperature 98.4 F (36.9 C), temperature source Oral, resp. rate 17, height 5\' 6"  (1.676 m), weight 79 kg (174 lb 2.6 oz), SpO2 100 %. No results found.  Recent Labs  03/11/17 0522  WBC 7.0  HGB 11.8*  HCT 34.7*  PLT 331    Recent Labs  03/11/17 0522  NA 140  K 4.0  CL 105  GLUCOSE 122*  BUN 15  CREATININE 0.86  CALCIUM 9.2   CBG (last 3)  No results for input(s): GLUCAP in the last 72 hours.  Wt Readings from Last 3 Encounters:  03/10/17 79 kg (174 lb 2.6 oz)  03/05/17 72.3 kg (159 lb 4.8 oz)    Physical Exam:  Constitutional: She is oriented to person, place, and time. She appears well-developed and well-nourished. No distress.  HENT:  Head: Normocephalic and atraumatic.  Mouth/Throat: Oropharynx is clear and moist.  Eyes: Pupils are equal, round, and reactive to light. Conjunctivae and EOM are normal.  Neck: Normal range of motion. Neck supple.  Cardiovascular: RRR without murmur. No JVD .   Respiratory:CTA Bilaterally without wheezes or rales. Normal effort   GI: Soft. Bowel sounds are normal. She exhibits no distension. There is no tenderness.  Musculoskeletal: She exhibits edema (min edema bilateral feet). She exhibits no tenderness.  Heels sl boggy   Neurological: She is alert and oriented to person, place, and time. A cranial nerve deficit is present.  Speech clear. Cognitively displays reasonable insight and awareness Few beats nystagmus horizontally during confrontation Able to follow basic commands without difficulty.  Motor:  5/5 grossly throughout RUE: Dysmetria is mild. LUE: Dysmetria and ataxia noted but able to hold fork and feed self.  Skin: Skin is warm and dry. She is not diaphoretic.  Psychiatric: pleasant and cooperative  Assessment/Plan: 1. Balance deficits/gait disorder secondary to left cerebellar infarct which require 3+ hours per day of interdisciplinary therapy in a comprehensive inpatient rehab setting. Physiatrist is providing close team supervision and 24 hour management of active medical problems listed below. Physiatrist and rehab team continue to assess barriers to discharge/monitor patient progress toward functional and medical goals.  Function:  Bathing Bathing position   Position: Shower  Bathing parts Body parts bathed by patient: Right arm, Left arm, Chest, Abdomen, Front perineal area, Buttocks, Right upper leg, Left upper leg Body parts bathed by helper: Right lower leg, Left lower leg, Back  Bathing assist Assist Level: Touching or steadying assistance(Pt > 75%)      Upper Body Dressing/Undressing Upper body dressing   What is the patient wearing?: Pull over shirt/dress, Bra Bra - Perfomed by patient: Thread/unthread right bra strap, Thread/unthread left bra strap, Hook/unhook bra (pull down sports bra)       Button up shirt - Perfomed by patient: Thread/unthread right sleeve, Thread/unthread left sleeve, Pull shirt around back, Button/unbutton shirt Button up shirt - Perfomed by helper: Pull shirt around back, Button/unbutton shirt    Upper body assist Assist Level: Set up   Set up : To obtain clothing/put away  Lower Body Dressing/Undressing Lower body dressing  What is the patient wearing?: Pants, Non-skid slipper socks     Pants- Performed by patient: Thread/unthread right pants leg, Thread/unthread left pants leg, Pull pants up/down     Non-skid slipper socks- Performed by helper: Don/doff right sock, Don/doff left sock                  Lower body  assist Assist for lower body dressing: Touching or steadying assistance (Pt > 75%)      Toileting Toileting   Toileting steps completed by patient: Adjust clothing prior to toileting, Performs perineal hygiene, Adjust clothing after toileting Toileting steps completed by helper: Adjust clothing prior to toileting, Adjust clothing after toileting Toileting Assistive Devices: Grab bar or rail  Toileting assist Assist level: Touching or steadying assistance (Pt.75%)   Transfers Chair/bed transfer   Chair/bed transfer method: Stand pivot Chair/bed transfer assist level: Touching or steadying assistance (Pt > 75%) Chair/bed transfer assistive device: Armrests, Medical sales representative     Max distance: 160 Assist level: Touching or steadying assistance (Pt > 75%)   Wheelchair   Type: Manual Max wheelchair distance: 160 Assist Level: Dependent (Pt equals 0%)  Cognition Comprehension Comprehension assist level: Follows complex conversation/direction with extra time/assistive device  Expression Expression assist level: Expresses complex 90% of the time/cues < 10% of the time  Social Interaction Social Interaction assist level: Interacts appropriately with others with medication or extra time (anti-anxiety, antidepressant).  Problem Solving Problem solving assist level: Solves basic 75 - 89% of the time/requires cueing 10 - 24% of the time  Memory Memory assist level: Recognizes or recalls 90% of the time/requires cueing < 10% of the time   Medical Problem List and Plan: 1.  Balance deficits, gait abnormality, limitations in self-care secondary to left cerebellar infarct with hemorrhagic conversion.  -Cont CIR PT, OT, SLP 2.  DVT Prophylaxis/Anticoagulation: Pharmaceutical: Lovenox, plt normal 8/14 3. Pain Management: tylenol prn 4. Mood: LCSW to follow for evaluation and support.  5. Neuropsych: This patient is capable of making decisions on her own behalf. 6. Skin/Wound  Care: routine pressure relief measures 7. Fluids/Electrolytes/Nutrition: Monitor I/O.  Marland Kitchen Push fluids to avoid orthostatic symptoms  -I personally reviewed the patient's labs today.    -seems to have reasonable appetite 8. SJG:GEZMOQH BP bid. Continue HCTZ, labetalol and Norvasc. Systolic running on high side, although will allow permissive hypertension for the first 1-2 weeks post stroke Vitals:   03/12/17 2059 03/13/17 0514  BP: (!) 161/63 (!) 168/80  Pulse: 81 86  Resp:  17  Temp:  98.4 F (36.9 C)  SpO2:  100%   9. Colitis? with melena: offer fluids frequently to maintain adequate hydration. Monitor for recurrence and check H/H serially.   -hgb 11.8 8/14 10. Hypokalemia: Resolved with supplement. K+ 4.0 8/14  11. Dizziness:  vestibular eval ordered  -will need acclimation .   12. Insomnia. Patient blames the probable on boots. Heels look okay, will DC boots and monitor  LOS (Days) 3 A FACE TO FACE EVALUATION WAS PERFORMED  Charlett Blake, MD 03/13/2017 8:27 AM

## 2017-03-13 NOTE — Progress Notes (Signed)
Physical Therapy Session Note  Patient Details  Name: Ashley Chan MRN: [id number] Date of Birth: 04/22/1929  Today's Date: 03/13/2017 PT Individual Time: 1000-1033 PT Individual Time Calculation (min): 33 min   Short Term Goals: Week 1:  PT Short Term Goal 1 (Week 1): Pt will ambulate 100' w/ Min A using LRAD PT Short Term Goal 2 (Week 1): Pt will negotiate 4 steps w/ Mod A PT Short Term Goal 3 (Week 1): Pt will all bed mobility w/ supervision and w/o handrail use or HOB elevated  PT Short Term Goal 4 (Week 1): Pt will transfer w/ Min guard via stand pivot transfer using LRAD PT Short Term Goal 5 (Week 1): Pt will maintain dynamic sitting balance w/ supervision  Skilled Therapeutic Interventions/Progress Updates: Pt received seated in w/c with daughter present; denies pain and agreeable to treatment. Instructed pt in vestibular HEP; provided pt with handout and pt able to read directions on handout and perform with minimal cueing. Supervision for 5 reps sit <>stand, cues for visual focus on target as part of exercise. Slow deliberate performance of forward leans toward foot for second exercise, no nausea reported during or after exercises pt reports "I'm just going slow and taking my time". Gait to gym with RW and close S overall, occasional minA due to staggering ataxic steps, 175' total. Once in gym pt required several minute rest break d/t fatigue. Attempted to engage pt in dynamic standing balance activities however pt reporting increased nausea; suspect latent effects of vestibular exercises. Lateral steps with RW to transfer from regular chair to w/c. Returned to room totalA. Supervision stand pivot transfer with RW to bed, S sit >supine. Remained semi-reclined in bed at end of session, alarm intact and all needs in reach. Missed 27 min PT d/t nausea.      Therapy Documentation Precautions:  Precautions Precautions: Fall Restrictions Weight Bearing Restrictions: No General: PT  Missed Treatment Reason: Patient ill (Comment) (nausea)  See Function Navigator for Current Functional Status.   Therapy/Group: Individual Therapy  Luberta Mutter 03/13/2017, 10:40 AM

## 2017-03-13 NOTE — Progress Notes (Signed)
Physical Therapy Session Note  Patient Details  Name: Ashley Chan MRN: [id number] Date of Birth: 1929/02/17  Today's Date: 03/13/2017 PT Individual Time: 1330-1400 PT Individual Time Calculation (min): 30 min   Short Term Goals: Week 1:  PT Short Term Goal 1 (Week 1): Pt will ambulate 100' w/ Min A using LRAD PT Short Term Goal 2 (Week 1): Pt will negotiate 4 steps w/ Mod A PT Short Term Goal 3 (Week 1): Pt will all bed mobility w/ supervision and w/o handrail use or HOB elevated  PT Short Term Goal 4 (Week 1): Pt will transfer w/ Min guard via stand pivot transfer using LRAD PT Short Term Goal 5 (Week 1): Pt will maintain dynamic sitting balance w/ supervision  Skilled Therapeutic Interventions/Progress Updates:    Session focused on addressing vestibular exercises (recommended 3 x daily and has completed 1 per report) for habituation, functional transfers, gait, and balance during toileting activities using RW with overall min assist, and functional gait on unit over carpeted surfaces with overall steadying to min assist for balance especially during turns due to decreased coordination in BLE. End of session left up in w/c with all needs in reach and family at bedside. Cues for safe positioning of RW during turns and uneven surface negotiation.   Therapy Documentation Precautions:  Precautions Precautions: Fall Restrictions Weight Bearing Restrictions: No  Pain: Reports headache - premedicated. Willing to participate.   See Function Navigator for Current Functional Status.   Therapy/Group: Individual Therapy  Canary Brim Ivory Broad, PT, DPT  03/13/2017, 4:03 PM

## 2017-03-13 NOTE — IPOC Note (Signed)
Overall Plan of Care [facility]) Patient Details Name: Ashley Chan MRN: [id number] DOB: September 10, 1928  Admitting Diagnosis: CVA  Hospital Problems: Active Problems:   Cerebellar stroke, acute ([facility])     Functional Problem List: Nursing Endurance, Safety  PT Balance, Endurance, Motor, Safety  OT Balance, Sensory, Other (Comment), Endurance, Behavior, Motor (Vestibular)  SLP    TR         Basic ADL's: OT Eating, Grooming, Bathing, Dressing, Toileting     Advanced  ADL's: OT Laundry     Transfers: PT Bed Mobility, Bed to Chair, Musician, [employer], Futures trader, Metallurgist: PT Ambulation, Emergency planning/management officer, Stairs     Additional Impairments: OT Fuctional Use of Upper Extremity  SLP        TR      Anticipated Outcomes Item Anticipated Outcome  Self Feeding Set-up- mod I  Swallowing      Basic self-care  Supervision- mod I  Toileting  Supervision- mod I   Bathroom Transfers Supervision  Bowel/Bladder  Continent to bladder and bowel with min. assist.  Transfers  Supervision  Locomotion  Supervision  Communication     Cognition     Pain  Less than 3 on 1 to 10 scale.  Safety/Judgment  Free from falls during her stay in rehab.   Therapy Plan: PT Intensity: Minimum of 1-2 x/day ,45 to 90 minutes PT Frequency: 5 out of 7 days PT Duration Estimated Length of Stay: 12-14 days OT Intensity: Minimum of 1-2 x/day, 45 to 90 minutes OT Frequency: 5 out of 7 days OT Duration/Estimated Length of Stay: 10-12 days      Team Interventions: Nursing Interventions Patient/Family Education  PT interventions Ambulation/gait training, Training and development officer, Cognitive remediation/compensation, Community reintegration, Discharge planning, Neuromuscular re-education, Functional mobility training, DME/adaptive equipment instruction, Functional electrical stimulation, Disease management/prevention, Pain management, Patient/family education, Psychosocial  support, Splinting/orthotics, UE/LE Coordination activities, UE/LE Strength taining/ROM, Therapeutic Exercise, Therapeutic Activities, Stair training, Visual/perceptual remediation/compensation, Wheelchair propulsion/positioning  OT Interventions Training and development officer, Academic librarian, Discharge planning, Disease mangement/prevention, Engineer, drilling, Functional mobility training, Neuromuscular re-education, Pain management, Patient/family education, Psychosocial support, Self Care/advanced ADL retraining, Skin care/wound managment, Splinting/orthotics, Therapeutic Activities, Therapeutic Exercise, UE/LE Strength taining/ROM, UE/LE Coordination activities, Visual/perceptual remediation/compensation  SLP Interventions    TR Interventions    SW/CM Interventions Discharge Planning, Psychosocial Support, Patient/Family Education   Barriers to Discharge MD  Medical stability and Home enviroment access/loayout  Nursing      PT Decreased caregiver support, Lack of/limited family support Daughters unable to physically assist, however able to provide 24/7 supervision  OT      SLP      SW Lack of/limited family support Dauhgter using a walker due to her own health issues,can not provide any physical assist.   Team Discharge Planning: Destination: PT-Home ,OT- Home , SLP-  Projected Follow-up: PT-Home health PT, Outpatient PT (Will continue to assess safety w/ going to/from OPPT w/ someone to drive her), OT-  Home health OT, SLP-None Projected Equipment Needs: PT-To be determined, OT- To be determined, SLP-None recommended by SLP Equipment Details: PT- , OT-  Patient/family involved in discharge planning: PT- Patient, Family member/caregiver,  OT-Patient, SLP-Patient, Family member/caregiver  MD ELOS: 10-14 days Medical Rehab Prognosis:  Good Assessment:  83 y.o.left handed female who was admitted to OSH with reports of dizziness, malaise and coffec ground emesis.  History taken from chart review, patient, and family.  CT head unremarkable for acute process. Unable to get  MRI brain due to the weekend. 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 She was transferred to [facility] for treatment and care. She was started medications for tighter BP control and required fluid boluses due to poor UOP. Repeat CT head 8/8 reviewed, stable midline shift. 2 D echo showed EF 60-655 with grade 2 diastolic dysfunction, moderate mitral valve calcification and moderate pulmonary HTN.  CTA head/neck showed severe left carotid bifurcation calcified plaque with severe 70% proximal L-ICA stenosis as well as thyroid nodules up to 15 mm. Repeat head CT 8/13 reviewed, with some improvement.   She did have bradycardic episode question due to vasovagal event as well as reports of purple/red stools.  Stool guaiacs heme positive and patient developed coffee ground emesis. Dr. Carlean Purl consulted for input as patient with history of gastric ulcers. EGD  done yesterday and showed duodenitis but rectal exam with hard stool and dark blood on digital exam.  She underwent colonoscopy today revealing ten mm ulcer in sigmoid colon with erythema and edema--bx taken, internal hems, two polyps--resected and mild diverticulosis.  GI recommended maintaining adequate hydration, avoiding NSAIDs x 2 weeks and suspected that ischemic colitis led to ulcer and heme positive stools. MRI C-spine reviewed, significant for degenerative changes and myelomalacia.     See Team Conference Notes for weekly updates to the plan of care

## 2017-03-13 NOTE — Progress Notes (Addendum)
Occupational Therapy Session Note  Patient Details  Name: Ashley Chan MRN: [id number] Date of Birth: [birth year]  Today's Date: 03/13/2017 OT Individual Time: 5852-7782 and 4235-3614 OT Individual Time Calculation (min): 60 min and 45 min   Short Term Goals: Week 1:  OT Short Term Goal 1 (Week 1): Pt will complete 3/3 toileting tasks with steadying assist OT Short Term Goal 2 (Week 1): Pt will complete 2 grooming tasks in standing with steadying assist in order to increase functional activity tolerance OT Short Term Goal 3 (Week 1): Pt will dress UB with set-up assist.  OT Short Term Goal 4 (Week 1): Pt will use L UE at non-dominant level with min cuing for initiation  Skilled Therapeutic Interventions/Progress Updates:    Session One: Pt seen for OT ADL bathing/dressing session. Pt in supine upon arrival, agreeable to tx session. She ambulated throughout room with min A using RW. She showered seated on 3-1 BSC, crossing ankle over knee to wash B feet. She returned to w/c to dress with increased time, steadying assist required to pull pants up. Pt requiring increased rest breaks throughout session due to decreased functional activity tolerance. She transferred to reclined couch in room, completed modified crunches, required to lean forward off back support to obtain item with L UE. Pt with R lean during movements, able to self- correct with VCs. From seated position, practiced picking up items from floor, pt required increased time and independently initiated use of fixed gaze when completing in order to decrease dizziness, reported no nausea and required rest breaks throughout. Pt then ambulated to bathroom and left seated on toilet, RN made aware of position and pt educated regarding pull call bell.   Session Two: Pt seen for OT session focusing on fine motor coordination and tolerance of position changes due to vestibular deficits. Pt in supine upon arrival, agreeable to tx session.  Completed stand pivot transfers with guarding assist and RW throughout session.  Seated on EOM in therapy gym completed 9 hole peg test, see results below. Pt then completed functional reaching task with L UE, required to reach and reposition small animal figurines. She was then required to place items in cup on floor, requiring her to bend down to place. Pt tolerated well with no complaints of nausea or increased dizziness. CGA for dynamic standing tasks.  Pt returned to room at end of session, requesting supine rest break before next session, left with all needs in reach and family present.   R: 45.72, 34.66, and 34.03 L: 1 min 21 seconds, 1 min 9 seconds, and 1 min 7 seconds   Therapy Documentation Precautions:  Precautions Precautions: Fall Restrictions Weight Bearing Restrictions: No Pain:    No/ denies pain See Function Navigator for Current Functional Status.   Therapy/Group: Individual Therapy  Lewis, [name] 03/13/2017, 7:07 AM

## 2017-03-13 NOTE — Plan of Care (Addendum)
Problem: RH BOWEL ELIMINATION Goal: RH STG MANAGE BOWEL WITH ASSISTANCE STG Manage Bowel with Min. Assistance.  Outcome: Progressing  03/13/17 0336  Bowel Management Goals  STG: Pt will manage bowels with assistance 3-Mod assistance    Problem: RH BLADDER ELIMINATION Goal: RH STG MANAGE BLADDER WITH ASSISTANCE STG Manage Bladder With Min.Assistance  Outcome: Progressing  03/13/17 0336  Bladder Management Goals  STG: Pt will manage bladder with assistance 3-Mod assistance    Problem: RH PAIN MANAGEMENT Goal: RH STG PAIN MANAGED AT OR BELOW PT'S PAIN GOAL Less than 3,on 1 to 10 score.  Outcome: Progressing Pt remain free of pain throughout the night

## 2017-03-14 ENCOUNTER — Ambulatory Visit (HOSPITAL_COMMUNITY): Payer: No Typology Code available for payment source | Admitting: Physical Therapy

## 2017-03-14 ENCOUNTER — Inpatient Hospital Stay (HOSPITAL_COMMUNITY): Payer: No Typology Code available for payment source | Admitting: Occupational Therapy

## 2017-03-14 DIAGNOSIS — M4802 Spinal stenosis, cervical region: Secondary | ICD-10-CM

## 2017-03-14 NOTE — Progress Notes (Signed)
Occupational Therapy Session Note  Patient Details  Name: Ashley Chan MRN: [id number] Date of Birth: [birth year]  Today's Date: 03/14/2017 OT Individual Time: 0945-1100 OT Individual Time Calculation (min): 75 min    Short Term Goals: Week 1:  OT Short Term Goal 1 (Week 1): Pt will complete 3/3 toileting tasks with steadying assist OT Short Term Goal 2 (Week 1): Pt will complete 2 grooming tasks in standing with steadying assist in order to increase functional activity tolerance OT Short Term Goal 3 (Week 1): Pt will dress UB with set-up assist.  OT Short Term Goal 4 (Week 1): Pt will use L UE at non-dominant level with min cuing for initiation  Skilled Therapeutic Interventions/Progress Updates:    Pt seen for OT ADL bathing/dressing session. Pt in supine upon arrival, agreeable to tx session and desiring to shower.  She ambulated throughout room with CGA, required VCs and demonstration for problem solving side stepping technique to maneuver around environemntal barriers.  She gathered clothing from chest of drawers, able to bbend down to bootom drawer to obtain item, requiring heavy min A for dynamic standing balance. She bathed seated on 3-1 BSC with supervision, VCs for safety awarness.as pt attempting to stand on pile of towels and balance on one foot while drying off.  She returned to w/c to dress with increased time for fine motor tasks of fastening and threading bra. She stood with guarding assist to pull pants up. Rest breaks required throughout due to decreased functional activity tolerance. Groom completed w/c level at sink for energy conservation. She ambulated to therapy gym with guarding assist, required one seated rest break which pt was able to self initiate. In therapy gym, completed vestibular HEP. Completed x5 sit <> stand from EOM without UE support. Then practiced picking items up from floor while seated EOM. Pt denied nausea following exercises, however did feel a  little more "rocky" in the head.Completed x2 trials. Pt left seated EOM at end of session with hand off to PT.  Pt educated throughout session regarding importance of rest breaks, rate of recovery, OT/PT goals, and d/c planning.   Therapy Documentation Precautions:  Precautions Precautions: Fall Restrictions Weight Bearing Restrictions: No Pain:    No/ denies pain  See Function Navigator for Current Functional Status.   Therapy/Group: Individual Therapy  Lewis, Purl Claytor C 03/14/2017, 7:12 AM

## 2017-03-14 NOTE — Progress Notes (Signed)
Occupational Therapy Session Note  Patient Details  Name: Ashley Chan MRN: [id number] Date of Birth: December 29, 1928  Today's Date: 03/14/2017 OT Individual Time: 1400-1500 OT Individual Time Calculation (min): 60 min    Short Term Goals: Week 1:  OT Short Term Goal 1 (Week 1): Pt will complete 3/3 toileting tasks with steadying assist OT Short Term Goal 2 (Week 1): Pt will complete 2 grooming tasks in standing with steadying assist in order to increase functional activity tolerance OT Short Term Goal 3 (Week 1): Pt will dress UB with set-up assist.  OT Short Term Goal 4 (Week 1): Pt will use L UE at non-dominant level with min cuing for initiation  Skilled Therapeutic Interventions/Progress Updates:    treatment session focused on ADL/self care training, transfer training, pt education, balance/functional reaching tasks, [place] tasks/standing endurance. Upon entering pt agreeable to OT session and transferred from supine lying to EOB with min A for UB support. She transferred EOB<>w/c with close guard assistance. Pt completed IADL tasks in standing with touching assistance for steadiness during stimulated laundry and kitchen access tasks, requiring crossing midline, dynamic/static standing, standing tolerance/endurance, and precision. Therapist instructed pt on safety awareness during transfers/fucntional mobility and activity. Pt tolerated standing for 4 minutes before reporting fatigue. Therapist and pt discussed bathroom/tub at home. Pt practiced tub transfers with grab bars and shower bench in therapy bathroom and required min A and verbal cues for task. Pt was returned to her room and completed w/c to bed transfer with min A and repositioned in bed with verbal instructions and min A for bridging and boosting self in bed. Pt left with needs met and resting with daughter present. No c/o of pain.   Therapy Documentation Precautions:  Precautions Precautions: Fall Restrictions Weight Bearing  Restrictions: No    Vital Signs: Therapy Vitals Temp: 98.6 F (37 C) Temp Source: Oral Pulse Rate: 72 Resp: 18 BP: (!) 154/68 Patient Position (if appropriate): Sitting Oxygen Therapy SpO2: 99 % O2 Device: Not Delivered Pain: Pain Assessment Pain Assessment: No/denies pain  See Function Navigator for Current Functional Status.   Therapy/Group: Individual Therapy  Delon [place] 03/14/2017, 3:15 PM

## 2017-03-14 NOTE — Progress Notes (Signed)
Physical Therapy Session Note  Patient Details  Name: Ashley Chan MRN: [id number] Date of Birth: [birth year]  Today's Date: 03/14/2017 PT Individual Time: 1100-1200 PT Individual Time Calculation (min): 60 min   Short Term Goals: Week 1:  PT Short Term Goal 1 (Week 1): Pt will ambulate 100' w/ Min A using LRAD PT Short Term Goal 2 (Week 1): Pt will negotiate 4 steps w/ Mod A PT Short Term Goal 3 (Week 1): Pt will all bed mobility w/ supervision and w/o handrail use or HOB elevated  PT Short Term Goal 4 (Week 1): Pt will transfer w/ Min guard via stand pivot transfer using LRAD PT Short Term Goal 5 (Week 1): Pt will maintain dynamic sitting balance w/ supervision  Skilled Therapeutic Interventions/Progress Updates: Pt received seated in gym with handoff from OT, denies pain and agreeable to treatment. Performed ascent/descent 4 stairs 6" height with B handrails x3 trials; min guard on initial trial faded to S for second and third trial. Gait 2x90' with RW and close S. Gait 3x90' with no AD, L HHA and min guard. Gait weaving through cones with min guard x2 trials. Standing balance on airex foam pad for focus on ankle strategy and righting reactions. Initially performed static balance with no UE support, progressed to performing BUE peg board in standing with min guard. Gait to return to room no AD and min guard x150'. Returned to supine with S. MinA scooting toward HOB with BUEs on rails and bridging. Remained supine with daughter present, all needs in reach at completion of session.         Therapy Documentation Precautions:  Precautions Precautions: Fall Restrictions Weight Bearing Restrictions: No  See Function Navigator for Current Functional Status.   Therapy/Group: Individual Therapy  Luberta Mutter 03/14/2017, 11:55 AM

## 2017-03-14 NOTE — Progress Notes (Signed)
[facility]IC[facility] & RE[facility]ABILITATION     PROGRESS NOTE    Subjective/Complaints:  SLept better with out prevalon boots, a little tired after therapy yesterday   ROS: pt denies nausea, vomiting, diarrhea, cough, shortness of breath or chest pain   Objective: Vital Signs: Blood pressure (!) 145/85, pulse 70, temperature 98.4 F (36.9 C), temperature source Oral, resp. rate 17, height 5\' 6"  (1.676 m), weight 80.1 kg (176 lb 8 oz), SpO2 97 %. No results found. No results for input(s): WBC, HGB, HCT, PLT in the last 72 hours. No results for input(s): NA, K, CL, GLUCOSE, BUN, CREATININE, CALCIUM in the last 72 hours.  Invalid input(s): CO CBG (last 3)  No results for input(s): GLUCAP in the last 72 hours.  Wt Readings from Last 3 Encounters:  03/12/17 80.1 kg (176 lb 8 oz)  03/05/17 72.3 kg (159 lb 4.8 oz)    Physical Exam:  Constitutional: She is oriented to person, place, and time. She appears well-developed and well-nourished. No distress.  HENT:  Head: Normocephalic and atraumatic.  Mouth/Throat: Oropharynx is clear and moist.  Eyes: Pupils are equal, round, and reactive to light. Conjunctivae and EOM are normal.  Neck: Normal range of motion. Neck supple.  Cardiovascular: RRR without murmur. No JVD .   Respiratory:CTA Bilaterally without wheezes or rales. Normal effort   GI: Soft. Bowel sounds are normal. She exhibits no distension. There is no tenderness.  Musculoskeletal: She exhibits edema (min edema bilateral feet). She exhibits no tenderness.  Heels without breakdown or swelling  Neurological: She is alert and oriented to person, place, and time. A cranial nerve deficit is present.  Speech clear. Cognitively displays reasonable insight and awareness Few beats nystagmus horizontally during confrontation Able to follow basic commands without difficulty.  Motor: 5/5 grossly throughout RUE: Dysmetria is mild. LUE: Dysmetria and ataxia noted but able to hold  fork and feed self.  Skin: Skin is warm and dry. She is not diaphoretic.  Psychiatric: pleasant and cooperative  Assessment/Plan: 1. Balance deficits/gait disorder secondary to left cerebellar infarct which require 3+ hours per day of interdisciplinary therapy in a comprehensive inpatient rehab setting. Physiatrist is providing close team supervision and 24 hour management of active medical problems listed below. Physiatrist and rehab team continue to assess barriers to discharge/monitor patient progress toward functional and medical goals.  Function:  Bathing Bathing position   Position: Shower  Bathing parts Body parts bathed by patient: Right arm, Left arm, Chest, Abdomen, Front perineal area, Buttocks, Right upper leg, Left upper leg, Right lower leg, Left lower leg Body parts bathed by helper: Back  Bathing assist Assist Level: Supervision or verbal cues      Upper Body Dressing/Undressing Upper body dressing   What is the patient wearing?: Pull over shirt/dress, Bra Bra - Perfomed by patient: Thread/unthread right bra strap, Thread/unthread left bra strap, Hook/unhook bra (pull down sports bra)       Button up shirt - Perfomed by patient: Thread/unthread right sleeve, Thread/unthread left sleeve, Pull shirt around back, Button/unbutton shirt Button up shirt - Perfomed by helper: Pull shirt around back, Button/unbutton shirt    Upper body assist Assist Level: Set up   Set up : To obtain clothing/put away  Lower Body Dressing/Undressing Lower body dressing   What is the patient wearing?: Pants, Socks, Shoes     Pants- Performed by patient: Thread/unthread right pants leg, Thread/unthread left pants leg, Pull pants up/down     Non-skid slipper socks-  Performed by helper: Don/doff right sock, Don/doff left sock Socks - Performed by patient: Don/doff right sock, Don/doff left sock   Shoes - Performed by patient: Don/doff right shoe, Don/doff left shoe (Slip ons)             Lower body assist Assist for lower body dressing: Touching or steadying assistance (Pt > 75%)      Toileting Toileting   Toileting steps completed by patient: Adjust clothing prior to toileting, Performs perineal hygiene, Adjust clothing after toileting Toileting steps completed by helper: Adjust clothing after toileting Toileting Assistive Devices: Grab bar or rail  Toileting assist Assist level: Touching or steadying assistance (Pt.75%)   Transfers Chair/bed transfer   Chair/bed transfer method: Ambulatory Chair/bed transfer assist level: Touching or steadying assistance (Pt > 75%) Chair/bed transfer assistive device: Armrests, Medical sales representative     Max distance: 175 Assist level: Touching or steadying assistance (Pt > 75%)   Wheelchair   Type: Manual Max wheelchair distance: 160 Assist Level: Dependent (Pt equals 0%)  Cognition Comprehension Comprehension assist level: Understands complex 90% of the time/cues 10% of the time  Expression Expression assist level: Expresses basic needs/ideas: With no assist  Social Interaction Social Interaction assist level: Interacts appropriately 90% of the time - Needs monitoring or encouragement for participation or interaction.  Problem Solving Problem solving assist level: Solves basic problems with no assist  Memory Memory assist level: Recognizes or recalls 90% of the time/requires cueing < 10% of the time   Medical Problem List and Plan: 1.  Balance deficits, gait abnormality, limitations in self-care secondary to left cerebellar infarct with hemorrhagic conversion.  -Cont CIR PT, OT, SLP 2.  DVT Prophylaxis/Anticoagulation: Pharmaceutical: Lovenox, plt normal 8/14 3. Pain Management: tylenol prn 4. Mood: LCSW to follow for evaluation and support.   5. Neuropsych: This patient is capable of making decisions on her own behalf. 6. Skin/Wound Care: routine pressure relief measures, heels look normal 7.  Fluids/Electrolytes/Nutrition: Monitor I/O.  Marland Kitchen Push fluids to avoid orthostatic symptoms  -I personally reviewed the patient's labs today.    -seems to have reasonable appetite 8. GXQ:JJHERDE BP bid. Continue HCTZ, labetalol and Norvasc. Overall improving with mild elevation, although will allow permissive hypertension for the first 1-2 weeks post stroke Vitals:   03/13/17 2027 03/14/17 0525  BP: 140/65 (!) 145/85  Pulse: 77 70  Resp: 18 17  Temp:  98.4 F (36.9 C)  SpO2: 98% 97%   9. Colitis? with melena: offer fluids frequently to maintain adequate hydration. Monitor for recurrence and check H/H serially.   -hgb 11.8 8/14 10. Hypokalemia: Resolved with supplement. K+ 4.0 8/14  11. Dizziness:  vestibular eval ordered  -will need acclimation .   12. Insomnia. Patient blames the prevalon boots.slept better without them last noc LOS (Days) 4 A FACE TO FACE EVALUATION WAS PERFORMED  Charlett Blake, MD 03/14/2017 8:15 AM

## 2017-03-15 ENCOUNTER — Inpatient Hospital Stay (HOSPITAL_COMMUNITY): Payer: No Typology Code available for payment source | Admitting: Physical Therapy

## 2017-03-15 ENCOUNTER — Inpatient Hospital Stay (HOSPITAL_COMMUNITY): Payer: No Typology Code available for payment source

## 2017-03-15 DIAGNOSIS — R269 Unspecified abnormalities of gait and mobility: Secondary | ICD-10-CM

## 2017-03-15 DIAGNOSIS — I639 Cerebral infarction, unspecified: Secondary | ICD-10-CM

## 2017-03-15 MED ORDER — SORBITOL 70 % SOLN
30.0000 mL | Freq: Every day | Status: DC | PRN
  Filled 2017-03-15: qty 30

## 2017-03-15 NOTE — Progress Notes (Signed)
Physical Therapy Session Note  Patient Details  Name: Ashley Chan MRN: [id number] Date of Birth: July 22, 1929  Today's Date: 03/15/2017 PT Individual Time: 0900-1000 and 1503-1530  PT Individual Time Calculation (min): 60 min and 27 min  Short Term Goals: Week 1:  PT Short Term Goal 1 (Week 1): Pt will ambulate 100' w/ Min A using LRAD PT Short Term Goal 2 (Week 1): Pt will negotiate 4 steps w/ Mod A PT Short Term Goal 3 (Week 1): Pt will all bed mobility w/ supervision and w/o handrail use or HOB elevated  PT Short Term Goal 4 (Week 1): Pt will transfer w/ Min guard via stand pivot transfer using LRAD PT Short Term Goal 5 (Week 1): Pt will maintain dynamic sitting balance w/ supervision  Skilled Therapeutic Interventions/Progress Updates: Tx1:  Pt presented sitting at EOB agreeable to therapy. Performed sit to stand min guard with RW and ambulated to toilet min guard, supervision for toilet transfers (+void). Pt stating feeling more "wobbly" this am, ambulated to rehab gym with RW. Pt with x 2 near LOB due to lateral sway which pt was able to self correct. Performed sit to/from stand no AD/no UE for balance and LE strengthening. Pt demonstrating posterior lean upon standing with x 2 LOB. Performed 2 sets x 5 with improvement during second step. Alternating toe taps 2 x 10 with B HHA, with LOB to R with wt shift. Pt with c/o increased nausea after balance activities. Ambulated through unit with RW and min guard on tiled surfaces and carpet. After seated rest pt returned to room and returned to bed with close supervision, dgt present with current needs met.     Tx2: Pt presented in bed stating feeling better than this am. Performed bed mobility with HOB elevated supervision, ambulatory transfer to toilet (+void). Ambulated to day room with RW Performed ambulation with object negotiation in day room with RW and without. Min guard with RW, minA with HHA due to occasional scissoring steps. After  seated rest pt began additional ambulation with HHA, after approx 62f pt stating seeing "dark spots" in R eye and feeling hot. Pt becoming increasingly unsteady thus PTA obtaining chair for pt to sit in, pt denies dizziness or nausea. Vitals taken HR 83, BP 143/61 and nsg notified. Pt returned to room via w/c and pt able to perform stand pivot to bed. Pt left in room with family present and current needs met.   Therapy Documentation Precautions:  Precautions Precautions: Fall Restrictions Weight Bearing Restrictions: No General:   Vital Signs:  Pain: Pain Assessment Pain Assessment: 0-10 Pain Score: 0-No pain Pain Type: Chronic pain Pain Location: Arm Pain Orientation: Right Pain Frequency: Constant Pain Onset: On-going Patients Stated Pain Goal: 0 Pain Intervention(s): Medication (See eMAR) Multiple Pain Sites: No   See Function Navigator for Current Functional Status.   Therapy/Group: Individual Therapy  Jamicheal Heard  Tasheena Wambolt, PTA  03/15/2017, 12:46 PM

## 2017-03-15 NOTE — Progress Notes (Signed)
Occupational Therapy Session Note  Patient Details  Name: Ashley Chan MRN: [id number] Date of Birth: January 14, 1929  Today's Date: 03/15/2017 OT Individual Time: 3790-2409 7353-2992 OT Individual Time Calculation (min): 57 min and 42   Short Term Goals: Week 1:  OT Short Term Goal 1 (Week 1): Pt will complete 3/3 toileting tasks with steadying assist OT Short Term Goal 2 (Week 1): Pt will complete 2 grooming tasks in standing with steadying assist in order to increase functional activity tolerance OT Short Term Goal 3 (Week 1): Pt will dress UB with set-up assist.  OT Short Term Goal 4 (Week 1): Pt will use L UE at non-dominant level with min cuing for initiation  Skilled Therapeutic Interventions/Progress Updates:    Session 1: 1:1. No c/o pain. Pt c/o nausea after brushing teeth. Pt ambulates throughout room with RW and min A for balance. Pt stands at sink to brush teeth with heavy min A for posterior/lateral leaning with dynamic movement. Pt requires VC for upright posture. Pt requests to eat prior to bathing. Pt eats EOB with VC to use BUE to cut up sausage patty with VC for greater force through knife to slice food to improve BUE coordination. Pt ambulates with RW into bathroom as stated above and completes clothing management/hygiene for toileting after continent bladder void with touching A. Pt sits EOB and manipulates scrabble tiles with LUE working on [facility] movements of palm>finger/finger>palm translocation and spelling words with tiles with VC to decrease compensatory movements. Exited session iwht pt semi reclined in bed with call light in reach and all needs met.  Session 2; no pain reported. Pt ambulates throughout session with MIN A for balance and VC for safety awarenees/RW management. Pt completes toileting with MIN A For balance and transfers onto [facility] in shower with MIN A and VC for safety awareness. Pt bathes at sit to stand level with touching A for pt to wash buttocks. Pt dresses  seated EOB with supervision for UB dressing and touching A for LB dressing. Pt requires VC to not stand up without RW within reach. Pt dons socks in seated figure 4 at EOB. Pt ambulates in hallway with 1 seated rest break turning head B to scan for room numbers/items in hallway. Exited session with pt semi reclined in bed with call light in reach.  Therapy Documentation Precautions:  Precautions Precautions: Fall Restrictions Weight Bearing Restrictions: No  See Function Navigator for Current Functional Status.   Therapy/Group: Individual Therapy  Tonny Branch 03/15/2017, 7:22 AM

## 2017-03-15 NOTE — Progress Notes (Signed)
[place] PHYSICAL MEDICINE & REHABILITATION     PROGRESS NOTE    Subjective/Complaints: Overall doing ok. Complains of being constipated  ROS: pt denies nausea, vomiting, diarrhea, cough, shortness of breath or chest pain   Objective: Vital Signs: Blood pressure (!) 157/56, pulse 66, temperature 98.8 F (37.1 C), temperature source Oral, resp. rate 18, height 5\' 6"  (1.676 m), weight 80.1 kg (176 lb 8 oz), SpO2 95 %. No results found. No results for input(s): WBC, HGB, HCT, PLT in the last 72 hours. No results for input(s): NA, K, CL, GLUCOSE, BUN, CREATININE, CALCIUM in the last 72 hours.  Invalid input(s): CO CBG (last 3)  No results for input(s): GLUCAP in the last 72 hours.  Wt Readings from Last 3 Encounters:  03/12/17 80.1 kg (176 lb 8 oz)  03/05/17 72.3 kg (159 lb 4.8 oz)    Physical Exam:  Constitutional: She is oriented to person, place, and time. She appears well-developed and well-nourished. No distress.  HENT:  Head: Normocephalic and atraumatic.  Mouth/Throat: Oropharynx is clear and moist.  Eyes: Pupils are equal, round, and reactive to light. Conjunctivae and EOM are normal.  Neck: Normal range of motion. Neck supple.  Cardiovascular: RRR without murmur. No JVD  .   Respiratory:CTA Bilaterally without wheezes or rales. Normal effort   GI: Soft. Bowel sounds are normal. She exhibits no distension. There is no tenderness.  Musculoskeletal: She exhibits edema (min edema bilateral feet). She exhibits no tenderness.  Heels without breakdown or swelling  Neurological: She is alert and oriented to person, place, and time. A cranial nerve deficit is present.  Speech clear. Delays in processing.  Few beats nystagmus horizontally during confrontation Able to follow basic commands without difficulty.  Motor: 5/5 grossly throughout RUE: Dysmetria is mild. LUE: Dysmetria and ataxia noted but able to hold fork and feed self.  Skin: Skin is warm and dry. She is not  diaphoretic.  Psychiatric: pleaasant  Assessment/Plan: 1. Balance deficits/gait disorder secondary to left cerebellar infarct which require 3+ hours per day of interdisciplinary therapy in a comprehensive inpatient rehab setting. Physiatrist is providing close team supervision and 24 hour management of active medical problems listed below. Physiatrist and rehab team continue to assess barriers to discharge/monitor patient progress toward functional and medical goals.  Function:  Bathing Bathing position   Position: Shower  Bathing parts Body parts bathed by patient: Right arm, Left arm, Chest, Abdomen, Front perineal area, Buttocks, Right upper leg, Left upper leg, Right lower leg, Left lower leg Body parts bathed by helper: Back  Bathing assist Assist Level: Supervision or verbal cues      Upper Body Dressing/Undressing Upper body dressing   What is the patient wearing?: Bra, Pull over shirt/dress Bra - Perfomed by patient: Thread/unthread right bra strap, Thread/unthread left bra strap, Hook/unhook bra (pull down sports bra)   Pull over shirt/dress - Perfomed by patient: Thread/unthread right sleeve, Thread/unthread left sleeve, Put head through opening, Pull shirt over trunk   Button up shirt - Perfomed by patient: Thread/unthread right sleeve, Thread/unthread left sleeve, Pull shirt around back, Button/unbutton shirt Button up shirt - Perfomed by helper: Pull shirt around back, Button/unbutton shirt    Upper body assist Assist Level: More than reasonable time   Set up : To obtain clothing/put away  Lower Body Dressing/Undressing Lower body dressing   What is the patient wearing?: Pants, Socks, Shoes     Pants- Performed by patient: Thread/unthread right pants leg, Thread/unthread left pants  leg, Pull pants up/down     Non-skid slipper socks- Performed by helper: Don/doff right sock, Don/doff left sock Socks - Performed by patient: Don/doff right sock, Don/doff left sock    Shoes - Performed by patient: Don/doff right shoe, Don/doff left shoe (Slip ons)            Lower body assist Assist for lower body dressing: Touching or steadying assistance (Pt > 75%)      Toileting Toileting   Toileting steps completed by patient: Adjust clothing prior to toileting, Performs perineal hygiene, Adjust clothing after toileting Toileting steps completed by helper: Adjust clothing prior to toileting, Adjust clothing after toileting Toileting Assistive Devices: Grab bar or rail  Toileting assist Assist level: Touching or steadying assistance (Pt.75%)   Transfers Chair/bed transfer   Chair/bed transfer method: Ambulatory Chair/bed transfer assist level: Touching or steadying assistance (Pt > 75%) Chair/bed transfer assistive device: Armrests, Medical sales representative     Max distance: 90 Assist level: Touching or steadying assistance (Pt > 75%)   Wheelchair   Type: Manual Max wheelchair distance: 160 Assist Level: Dependent (Pt equals 0%)  Cognition Comprehension Comprehension assist level: Follows complex conversation/direction with extra time/assistive device  Expression Expression assist level: Expresses complex 90% of the time/cues < 10% of the time  Social Interaction Social Interaction assist level: Interacts appropriately with others with medication or extra time (anti-anxiety, antidepressant).  Problem Solving Problem solving assist level: Solves basic 75 - 89% of the time/requires cueing 10 - 24% of the time  Memory Memory assist level: Recognizes or recalls 90% of the time/requires cueing < 10% of the time   Medical Problem List and Plan: 1.  Balance deficits, gait abnormality, limitations in self-care secondary to left cerebellar infarct with hemorrhagic conversion.  -Cont CIR PT, OT, SLP 2.  DVT Prophylaxis/Anticoagulation: Pharmaceutical: Lovenox, plt normal 8/14 3. Pain Management: tylenol prn 4. Mood: LCSW to follow for evaluation  and support.   5. Neuropsych: This patient is capable of making decisions on her own behalf. 6. Skin/Wound Care: routine pressure relief measures, heels look normal 7. Fluids/Electrolytes/Nutrition: Monitor I/O.  Marland Kitchen Push fluids to avoid orthostatic symptoms  -I personally reviewed the patient's labs today.    -seems to have reasonable appetite 8. NFA:[id number] BP bid. Continue HCTZ, labetalol and Norvasc.   -improved control/ permissible HTN Vitals:   03/14/17 1401 03/15/17 0415  BP: (!) 154/68 (!) 157/56  Pulse: 72 66  Resp: 18 18  Temp: 98.6 F (37 C) 98.8 F (37.1 C)  SpO2: 99% 95%   9. Colitis? with melena: offer fluids frequently to maintain adequate hydration. Monitor for recurrence and check H/H serially.   -hgb 11.8 8/14 10. Hypokalemia: Resolved with supplement. K+ 4.0 8/14  11. Dizziness:  vestibular eval ordered  -will need acclimation .   12. Insomnia. Better without prevalon boots   LOS (Days) 5 A FACE TO FACE EVALUATION WAS PERFORMED  Meredith Staggers, MD 03/15/2017 10:08 AM

## 2017-03-16 ENCOUNTER — Inpatient Hospital Stay (HOSPITAL_COMMUNITY): Payer: No Typology Code available for payment source | Admitting: Occupational Therapy

## 2017-03-16 DIAGNOSIS — K5900 Constipation, unspecified: Secondary | ICD-10-CM

## 2017-03-16 MED ORDER — SORBITOL 70 % SOLN
60.0000 mL | Status: AC
  Administered 2017-03-16: 60 mL via ORAL
  Filled 2017-03-16: qty 60

## 2017-03-16 MED ORDER — FLEET ENEMA 7-19 GM/118ML RE ENEM: 1.0000 | ENEMA | Freq: Every day | RECTAL | Status: DC | PRN

## 2017-03-16 NOTE — Progress Notes (Signed)
[place] PHYSICAL MEDICINE & REHABILITATION     PROGRESS NOTE    Subjective/Complaints: Still constipated. Otherwise feels well. Minimal results with bowels yesterday  ROS: pt denies nausea, vomiting, diarrhea, cough, shortness of breath or chest pain   Objective: Vital Signs: Blood pressure (!) 143/84, pulse 79, temperature 98.4 F (36.9 C), temperature source Oral, resp. rate 18, height 5\' 6"  (1.676 m), weight 80.1 kg (176 lb 8 oz), SpO2 99 %. No results found. No results for input(s): WBC, HGB, HCT, PLT in the last 72 hours. No results for input(s): NA, K, CL, GLUCOSE, BUN, CREATININE, CALCIUM in the last 72 hours.  Invalid input(s): CO CBG (last 3)  No results for input(s): GLUCAP in the last 72 hours.  Wt Readings from Last 3 Encounters:  03/12/17 80.1 kg (176 lb 8 oz)  03/05/17 72.3 kg (159 lb 4.8 oz)    Physical Exam:  Constitutional: She is oriented to person, place, and time. She appears well-developed and well-nourished. No distress.  HENT:  Head: Normocephalic and atraumatic.  Mouth/Throat: Oropharynx is clear and moist.  Eyes: Pupils are equal, round, and reactive to light. Conjunctivae and EOM are normal.  Neck: Normal range of motion. Neck supple.  Cardiovascular: RRR without murmur. No JVD  .   Respiratory:CTA Bilaterally without wheezes or rales. Normal effort    GI: Soft. Bowel sounds are normal. She exhibits no distension. There is no tenderness.  Musculoskeletal: She exhibits edema (min edema bilateral feet). She exhibits no tenderness.  Heels without breakdown or swelling  Neurological: She is alert and oriented to person, place, and time. A cranial nerve deficit is present.  Speech clear. Delays in processing.  Able to follow basic commands without difficulty.  Motor: 5/5 grossly throughout RUE: Dysmetria is mild. LUE: Dysmetria and ataxia noted but able to hold fork and feed self.  Skin: Skin is warm and dry. She is not diaphoretic.  Psychiatric:  pleaasant  Assessment/Plan: 1. Balance deficits/gait disorder secondary to left cerebellar infarct which require 3+ hours per day of interdisciplinary therapy in a comprehensive inpatient rehab setting. Physiatrist is providing close team supervision and 24 hour management of active medical problems listed below. Physiatrist and rehab team continue to assess barriers to discharge/monitor patient progress toward functional and medical goals.  Function:  Bathing Bathing position   Position: Shower  Bathing parts Body parts bathed by patient: Right arm, Left arm, Chest, Abdomen, Front perineal area, Buttocks, Right upper leg, Left upper leg, Right lower leg, Left lower leg Body parts bathed by helper: Back  Bathing assist Assist Level: Supervision or verbal cues      Upper Body Dressing/Undressing Upper body dressing   What is the patient wearing?: Bra, Pull over shirt/dress Bra - Perfomed by patient: Thread/unthread right bra strap, Thread/unthread left bra strap, Hook/unhook bra (pull down sports bra)   Pull over shirt/dress - Perfomed by patient: Thread/unthread right sleeve, Thread/unthread left sleeve, Put head through opening, Pull shirt over trunk   Button up shirt - Perfomed by patient: Thread/unthread right sleeve, Thread/unthread left sleeve, Pull shirt around back, Button/unbutton shirt Button up shirt - Perfomed by helper: Pull shirt around back, Button/unbutton shirt    Upper body assist Assist Level: More than reasonable time   Set up : To obtain clothing/put away  Lower Body Dressing/Undressing Lower body dressing   What is the patient wearing?: Pants, Socks, Shoes     Pants- Performed by patient: Thread/unthread right pants leg, Thread/unthread left pants leg, Pull  pants up/down     Non-skid slipper socks- Performed by helper: Don/doff right sock, Don/doff left sock Socks - Performed by patient: Don/doff right sock, Don/doff left sock   Shoes - Performed by  patient: Don/doff right shoe, Don/doff left shoe (Slip ons)            Lower body assist Assist for lower body dressing: Touching or steadying assistance (Pt > 75%)      Toileting Toileting   Toileting steps completed by patient: Performs perineal hygiene Toileting steps completed by helper: Adjust clothing prior to toileting, Adjust clothing after toileting Toileting Assistive Devices: Grab bar or rail  Toileting assist Assist level: Supervision or verbal cues   Transfers Chair/bed transfer   Chair/bed transfer method: Ambulatory Chair/bed transfer assist level: Touching or steadying assistance (Pt > 75%) Chair/bed transfer assistive device: Armrests, Medical sales representative     Max distance: 90 Assist level: Touching or steadying assistance (Pt > 75%)   Wheelchair   Type: Manual Max wheelchair distance: 160 Assist Level: Dependent (Pt equals 0%)  Cognition Comprehension Comprehension assist level: Follows complex conversation/direction with extra time/assistive device  Expression Expression assist level: Expresses complex 90% of the time/cues < 10% of the time  Social Interaction Social Interaction assist level: Interacts appropriately with others with medication or extra time (anti-anxiety, antidepressant).  Problem Solving Problem solving assist level: Solves basic 75 - 89% of the time/requires cueing 10 - 24% of the time  Memory Memory assist level: Recognizes or recalls 90% of the time/requires cueing < 10% of the time   Medical Problem List and Plan: 1.  Balance deficits, gait abnormality, limitations in self-care secondary to left cerebellar infarct with hemorrhagic conversion.  -Cont CIR PT, OT, SLP 2.  DVT Prophylaxis/Anticoagulation: Pharmaceutical: Lovenox, plt normal 8/14 3. Pain Management: tylenol prn 4. Mood: LCSW to follow for evaluation and support.   5. Neuropsych: This patient is capable of making decisions on her own behalf. 6. Skin/Wound  Care: routine pressure relief measures, heels look normal 7. Fluids/Electrolytes/Nutrition: Monitor I/O.  Marland Kitchen Push fluids to avoid orthostatic symptoms  -I personally reviewed the patient's labs today.    -seems to have reasonable appetite 8. TAV:WPVXYIA BP bid. Continue HCTZ, labetalol and Norvasc.   -improved control at present/ permissible HTN Vitals:   03/15/17 1812 03/16/17 0702  BP: (!) 152/68 (!) 143/84  Pulse: 76 79  Resp:  18  Temp: 98.7 F (37.1 C) 98.4 F (36.9 C)  SpO2: 100% 99%   9. Colitis? with melena: offer fluids frequently to maintain adequate hydration. Monitor for recurrence and check H/H serially.   -hgb 11.8 8/14 10. Hypokalemia: Resolved with supplement. K+ 4.0 8/14  11. Dizziness:  vestibular eval ordered  -will need acclimation .   12. Insomnia. Better without prevalon boots 13. Constipation: sorbitol today, fleet enema prn   LOS (Days) 6 A Nowthen T, MD 03/16/2017 10:39 AM

## 2017-03-16 NOTE — Progress Notes (Signed)
Occupational Therapy Session Note  Patient Details  Name: Ashley Chan MRN: [id number] Date of Birth: 06-Aug-1928  Today's Date: 03/16/2017 OT Individual Time: 2992-4268 OT Individual Time Calculation (min): 32 min    Short Term Goals: Week 1:  OT Short Term Goal 1 (Week 1): Pt will complete 3/3 toileting tasks with steadying assist OT Short Term Goal 2 (Week 1): Pt will complete 2 grooming tasks in standing with steadying assist in order to increase functional activity tolerance OT Short Term Goal 3 (Week 1): Pt will dress UB with set-up assist.  OT Short Term Goal 4 (Week 1): Pt will use L UE at non-dominant level with min cuing for initiation  Skilled Therapeutic Interventions/Progress Updates:    Tx focus on balance, UE coordination, functional ambulation with device, and endurance during self care tasks.   Pt greeted supine in bed, requesting shower. She ambulated with RW and Min A into bathroom and transferred to toilet, voided bladder, and doffed clothing. She then transferred to [facility] in shower, required cues for safety with walker mgt. Pt bathed with overall steady assist, standing as needed with grab bars for pericare. Afterwards she ambulated to w/c placed at bedside. Pt completed dressing with steady assist, cues for safe hand placement during sit<stand transitions as pt tended to hold onto walker and "plop" down into chair. Grooming tasks completed with setup. Pt left with all needs within reach and visitors present.   Therapy Documentation Precautions:  Precautions Precautions: Fall Restrictions Weight Bearing Restrictions: No Pain: No c/o pain during tx  Pain Assessment Pain Assessment: No/denies pain ADL:      See Function Navigator for Current Functional Status.   Therapy/Group: Individual Therapy  Jessyka Austria A Laiyah Exline 03/16/2017, 12:21 PM

## 2017-03-17 ENCOUNTER — Inpatient Hospital Stay (HOSPITAL_COMMUNITY): Payer: No Typology Code available for payment source | Admitting: Occupational Therapy

## 2017-03-17 ENCOUNTER — Inpatient Hospital Stay (HOSPITAL_COMMUNITY): Payer: Medicare Other

## 2017-03-17 NOTE — Progress Notes (Addendum)
Physical Therapy Note  Patient Details  Name: Ashley Chan MRN: [id number] Date of Birth: 1929-01-26 Today's Date: 03/17/2017  [id number], 45 min individual tx  Pt eating breakfast in sitting EOB with RN dispensing meds.  PT donned shoes and socks for pt while pt ate.  Sit>< stand x 2 from bed with mod VCs for technique to bring wt forward.  Gait to/from toilet with RW; no void.  Standing neuro re-ed for bil LEs to increase strength for functional mobility: 8 x 1 each R/L tandem mini squats, calf raises, toe raises, min assist for balance. Gait on level terrain with min assist fading to S. Pt left resting EOB with Amy, OT starting next session.  Pain: none per pt  See for functional tool for rating scale.  Nicci Vaughan 03/17/2017, 7:45 AM

## 2017-03-17 NOTE — Progress Notes (Signed)
Social Work Patient ID: Ashley Chan, female   DOB: 10-08-1928, 83 y.o.   MRN: [id number]  Team feels pt will reach her goals by 8/25-Sat instead of 8/28. Awaiting MD approval and will let pt/family know of the new date.

## 2017-03-17 NOTE — Progress Notes (Signed)
[place] PHYSICAL MEDICINE & REHABILITATION     PROGRESS NOTE    Subjective/Complaints:  No issues overnite, amb in hallway  ROS: pt denies nausea, vomiting, diarrhea, cough, shortness of breath or chest pain   Objective: Vital Signs: Blood pressure 131/69, pulse 81, temperature 98.3 F (36.8 C), temperature source Oral, resp. rate 16, height 5\' 6"  (1.676 m), weight 80.1 kg (176 lb 8 oz), SpO2 99 %. No results found. No results for input(s): WBC, HGB, HCT, PLT in the last 72 hours. No results for input(s): NA, K, CL, GLUCOSE, BUN, CREATININE, CALCIUM in the last 72 hours.  Invalid input(s): CO CBG (last 3)  No results for input(s): GLUCAP in the last 72 hours.  Wt Readings from Last 3 Encounters:  03/12/17 80.1 kg (176 lb 8 oz)  03/05/17 72.3 kg (159 lb 4.8 oz)    Physical Exam:  Constitutional: She is oriented to person, place, and time. She appears well-developed and well-nourished. No distress.  HENT:  Head: Normocephalic and atraumatic.  Mouth/Throat: Oropharynx is clear and moist.  Eyes: Pupils are equal, round, and reactive to light. Conjunctivae and EOM are normal.  Neck: Normal range of motion. Neck supple.  Cardiovascular: RRR without murmur. No JVD  .   Respiratory:CTA Bilaterally without wheezes or rales. Normal effort    GI: Soft. Bowel sounds are normal. She exhibits no distension. There is no tenderness.  Musculoskeletal: She exhibits edema (min edema bilateral feet). She exhibits no tenderness.  Heels without breakdown or swelling  Neurological: She is alert and oriented to person, place, and time. A cranial nerve deficit is present.  Speech clear. Delays in processing.  Able to follow basic commands without difficulty.  Motor: 5/5 grossly throughout RUE: Dysmetria is mild. LUE: Dysmetria and ataxia noted but able to hold fork and feed self.  Skin: Skin is warm and dry. She is not diaphoretic.  Psychiatric: pleaasant  Assessment/Plan: 1. Balance  deficits/gait disorder secondary to left cerebellar infarct which require 3+ hours per day of interdisciplinary therapy in a comprehensive inpatient rehab setting. Physiatrist is providing close team supervision and 24 hour management of active medical problems listed below. Physiatrist and rehab team continue to assess barriers to discharge/monitor patient progress toward functional and medical goals.  Function:  Bathing Bathing position   Position: Shower  Bathing parts Body parts bathed by patient: Right arm, Left arm, Chest, Abdomen, Front perineal area, Buttocks, Right upper leg, Left upper leg Body parts bathed by helper: Back  Bathing assist Assist Level: Touching or steadying assistance(Pt > 75%)      Upper Body Dressing/Undressing Upper body dressing   What is the patient wearing?: Bra, Pull over shirt/dress Bra - Perfomed by patient: Thread/unthread right bra strap, Thread/unthread left bra strap, Hook/unhook bra (pull down sports bra)   Pull over shirt/dress - Perfomed by patient: Thread/unthread right sleeve, Thread/unthread left sleeve, Put head through opening, Pull shirt over trunk   Button up shirt - Perfomed by patient: Thread/unthread right sleeve, Thread/unthread left sleeve, Pull shirt around back, Button/unbutton shirt Button up shirt - Perfomed by helper: Pull shirt around back, Button/unbutton shirt    Upper body assist Assist Level: Set up   Set up : To obtain clothing/put away  Lower Body Dressing/Undressing Lower body dressing   What is the patient wearing?: Pants, Non-skid slipper socks     Pants- Performed by patient: Thread/unthread right pants leg, Thread/unthread left pants leg, Pull pants up/down   Non-skid slipper socks- Performed by  patient: Don/doff right sock, Don/doff left sock Non-skid slipper socks- Performed by helper: Don/doff right sock, Don/doff left sock Socks - Performed by patient: Don/doff right sock, Don/doff left sock   Shoes -  Performed by patient: Don/doff right shoe, Don/doff left shoe (Slip ons)            Lower body assist Assist for lower body dressing: Touching or steadying assistance (Pt > 75%)      Toileting Toileting Toileting activity did not occur: No continent bowel/bladder event Toileting steps completed by patient: Adjust clothing prior to toileting, Adjust clothing after toileting Toileting steps completed by helper: Adjust clothing prior to toileting, Adjust clothing after toileting Toileting Assistive Devices: Grab bar or rail  Toileting assist Assist level: Supervision or verbal cues   Transfers Chair/bed transfer   Chair/bed transfer method: Ambulatory Chair/bed transfer assist level: Touching or steadying assistance (Pt > 75%) Chair/bed transfer assistive device: Armrests, Medical sales representative     Max distance: 90 Assist level: Touching or steadying assistance (Pt > 75%)   Wheelchair   Type: Manual Max wheelchair distance: 160 Assist Level: Dependent (Pt equals 0%)  Cognition Comprehension Comprehension assist level: Follows complex conversation/direction with extra time/assistive device  Expression Expression assist level: Expresses complex 90% of the time/cues < 10% of the time  Social Interaction Social Interaction assist level: Interacts appropriately with others with medication or extra time (anti-anxiety, antidepressant).  Problem Solving Problem solving assist level: Solves basic 75 - 89% of the time/requires cueing 10 - 24% of the time  Memory Memory assist level: Recognizes or recalls 90% of the time/requires cueing < 10% of the time   Medical Problem List and Plan: 1.  Balance deficits, gait abnormality, limitations in self-care secondary to left cerebellar infarct with hemorrhagic conversion.  -Cont CIR PT, OT, SLP 2.  DVT Prophylaxis/Anticoagulation: Pharmaceutical: Lovenox, plt normal 8/14 3. Pain Management: tylenol prn 4. Mood: LCSW to follow for  evaluation and support.   5. Neuropsych: This patient is capable of making decisions on her own behalf. 6. Skin/Wound Care: routine pressure relief measures, heels look normal 7. Fluids/Electrolytes/Nutrition: Monitor I/O.  Marland Kitchen Push fluids to avoid orthostatic symptoms  -I personally reviewed the patient's labs today.    -seems to have reasonable appetite 8. ZOX:WRUEAVW BP bid. Continue HCTZ, labetalol and Norvasc.   -improved control at present/ permissible HTN Vitals:   03/16/17 1530 03/17/17 0451  BP: 140/75 131/69  Pulse: 75 81  Resp: 18 16  Temp: 98.6 F (37 C) 98.3 F (36.8 C)  SpO2: 99% 99%   9. Colitis? with melena: offer fluids frequently to maintain adequate hydration. Monitor for recurrence and check H/H serially.   -hgb 11.8 8/14 10. Hypokalemia: Resolved with supplement. K+ 4.0 8/14  11. Dizziness:  vestibular eval ordered  -will need acclimation .   12. Insomnia. Better without prevalon boots 13. Constipation: sorbitol today, fleet enema prn   LOS (Days) 7 A FACE TO FACE EVALUATION WAS PERFORMED  Charlett Blake, MD 03/17/2017 8:44 AM

## 2017-03-17 NOTE — Progress Notes (Signed)
Occupational Therapy Session Note  Patient Details  Name: Ashley Chan MRN: [id number] Date of Birth: [birth year]  Today's Date: 03/17/2017 OT Individual Time: 3016-0109 and 3235-5732 OT Individual Time Calculation (min): 60 min and 90 min   Short Term Goals: Week 1:  OT Short Term Goal 1 (Week 1): Pt will complete 3/3 toileting tasks with steadying assist OT Short Term Goal 2 (Week 1): Pt will complete 2 grooming tasks in standing with steadying assist in order to increase functional activity tolerance OT Short Term Goal 3 (Week 1): Pt will dress UB with set-up assist.  OT Short Term Goal 4 (Week 1): Pt will use L UE at non-dominant level with min cuing for initiation  Skilled Therapeutic Interventions/Progress Updates:    Session one: Pt seen for OT ADL bathing/dressing session. Pt received sitting EOB with hand off from PT. Voiced increased fatigue this morning, however, willing to attempt therapy session and desiring to shower. She ambulated throughout room with close supervision, VCs for RW managememnt in functional context. First completed toileting task with close supervision before ambulating into shower. Bathed seated on 3-1 BSC. Voiced still "feeling rocky" when bathing on BSC, stating it wrosened with certain head movements. She stood with use of grab bar to complete pericare/ buttock hygiene. She returned to w/c to dress with increased time and rest breaks required. Pt noted to have increased edema in B LEs L>R. RN gave verbal orders for TED hose and donned total A. Following seated rest break, pt completed grooming tasks standing at sink. Pt requesting return to supine at end of session due to fatigue. Left in supine with all needs in reach.   Did not complete vestibular HEP today due to nausea, will attempt at next session.   Session Two: Pt seen for OT session focusing on ADL/IADL re-training, vestibular HEP, and functional mobility. Pt in supine upon arrival, voicing feeling  much better than this morning and agreeable to tx session.  She ambulated throughout unit with RW and close supervision, occasional VC for RW management and upright posture.   Completed vestibular HEP x2 during session, able to recall exercises independently and completed without any nausea/ increase of symptoms  Ambulated on carpeted surface with RW to pick up towels in simulation of IADL task, completed with guarding assist. Stood at table to fold towels, affected L UE used at non-dominant level mod I.   In ADL apartment, completed simulated tub/shower transfer utilizing tub bench with supervision. Recommendation made for bench vs shower chair to increase safety and independence with bathing task, pt voiced understanding and agreement.  Completed x2 sets of stairs in therapy gym ascending 2"step and descending 6" step, completed with close supervision using B hand rails.  Functional ambulation without AD ~30 ft with min A, ambulated with SBQC back to room with min A following demonstration and VCs for step pattern with cane. Recommend to cont with use of RW outside of tx time. Education provided regarding progression of AD and energy conservation.  Pt returned to supine at end of session, left with all needs in reach and family present.  Discussed moving up planned d/c date due to pt progress, followed up with CSW for MD approval, pt aware and excited about progress.   Therapy Documentation Precautions:  Precautions Precautions: Fall Restrictions Weight Bearing Restrictions: No Pain:   No complaints of pain. Complaints of nausea. Provided with ginger ale and rest breaks throughout  See Function Navigator for Current Functional Status.  Therapy/Group: Individual Therapy  Lewis, Nikiya Starn C 03/17/2017, 7:12 AM

## 2017-03-18 ENCOUNTER — Inpatient Hospital Stay (HOSPITAL_COMMUNITY): Payer: No Typology Code available for payment source | Admitting: Occupational Therapy

## 2017-03-18 ENCOUNTER — Inpatient Hospital Stay (HOSPITAL_COMMUNITY): Payer: No Typology Code available for payment source | Admitting: Physical Therapy

## 2017-03-18 LAB — BASIC METABOLIC PANEL
ANION GAP: 8 (ref 5–15)
BUN: 17 mg/dL (ref 6–20)
CALCIUM: 9.7 mg/dL (ref 8.9–10.3)
CHLORIDE: 102 mmol/L (ref 101–111)
CO2: 29 mmol/L (ref 22–32)
Creatinine, Ser: 0.89 mg/dL (ref 0.44–1.00)
GFR calc non Af Amer: 57 mL/min — ABNORMAL LOW (ref >60)
Glucose, Bld: 131 mg/dL — ABNORMAL HIGH (ref 65–99)
Potassium: 3.8 mmol/L (ref 3.5–5.1)
SODIUM: 139 mmol/L (ref 135–145)

## 2017-03-18 LAB — CBC
HEMATOCRIT: 35.7 % — AB (ref 36.0–46.0)
HEMOGLOBIN: 11.9 g/dL — AB (ref 12.0–15.0)
MCH: 31.1 pg (ref 26.0–34.0)
MCHC: 33.3 g/dL (ref 30.0–36.0)
MCV: 93.2 fL (ref 78.0–100.0)
Platelets: 392 10*3/uL (ref 150–400)
RBC: 3.83 MIL/uL — ABNORMAL LOW (ref 3.87–5.11)
RDW: 13 % (ref 11.5–15.5)
WBC: 5.9 10*3/uL (ref 4.0–10.5)

## 2017-03-18 NOTE — Progress Notes (Signed)
Occupational Therapy Weekly Progress Note  Patient Details  Name: Ashley Chan MRN: [id number] Date of Birth: [birth year]  Beginning of progress report period: March 12, 2017 End of progress report period: March 18, 2017  Today's Date: 03/18/2017 OT Individual Time: 1100-1200 OT Individual Time Calculation (min): 60 min    Patient has met 4 of 4 short term goals.  Pt making excellent progress towards OT goals. Have moved up pt's d/c date due to progress. Pt cont to have vestibular effects from CVA which causes "rocky" feeling, however, pt has demonstrated ability to compensate for deficits to increase safety and initiates need for rest breaks independently. Pt happy with progress made thus far on IPR and in agreement with change in d/c date.   Patient continues to demonstrate the following deficits:abnormal posture, apraxia, ataxia, hemiplegia affecting dominant side and muscle weakness (generalized) and therefore will continue to benefit from skilled OT intervention to enhance overall performance with BADL and iADL.  Patient progressing toward long term goals..  Continue plan of care.  OT Short Term Goals Week 1:  OT Short Term Goal 1 (Week 1): Pt will complete 3/3 toileting tasks with steadying assist OT Short Term Goal 1 - Progress (Week 1): Met OT Short Term Goal 2 (Week 1): Pt will complete 2 grooming tasks in standing with steadying assist in order to increase functional activity tolerance OT Short Term Goal 2 - Progress (Week 1): Met OT Short Term Goal 3 (Week 1): Pt will dress UB with set-up assist.  OT Short Term Goal 3 - Progress (Week 1): Met OT Short Term Goal 4 (Week 1): Pt will use L UE at non-dominant level with min cuing for initiation OT Short Term Goal 4 - Progress (Week 1): Met Week 2:  OT Short Term Goal 1 (Week 2): STG=LTG due to LOS  Skilled Therapeutic Interventions/Progress Updates:    Pt seen for OT ADL bathing/dressing session. Pt received with hand off  from PT, desiring to shower. She ambulated throughout room with RW and dupervision, completed toileting task with close supervision, not needing to use grab bars today to assist with balance. She ambulated to shower and bathed seated on BSC. She stood to complete pericare/ buttock hygiene with supervision. Returned to w/c to dress with increased time and supervision to stand at [facility] to pull pants up.  Completed vestibular HEP with guarding assist for 5x sit <> stand x2 trials with seated rest break between trials. Then completed toe touches from standing position with guarding assist. Pt tolerating well with no increase of symptoms.  Pt left seated in w/c at end of session, all needs in reach with daughter present.   Therapy Documentation Precautions:  Precautions Precautions: Fall Restrictions Weight Bearing Restrictions: No Pain: Pain Assessment Pain Assessment: No/denies pain Pain Score: 0-No pain  See Function Navigator for Current Functional Status.   Therapy/Group: Individual Therapy  Lewis, Maliik Karner C 03/18/2017, 7:05 AM

## 2017-03-18 NOTE — Progress Notes (Signed)
Physical Therapy Session Note  Patient Details  Name: GENINE BECKETT MRN: [id number] Date of Birth: 05/22/1929  Today's Date: 03/18/2017 PT Individual Time: 8921-1941 1030-1100 and 1300-1400 PT Individual Time Calculation (min): 45 min, 30 min, and 60 min  Short Term Goals: Week 1:  PT Short Term Goal 1 (Week 1): Pt will ambulate 100' w/ Min A using LRAD PT Short Term Goal 2 (Week 1): Pt will negotiate 4 steps w/ Mod A PT Short Term Goal 3 (Week 1): Pt will all bed mobility w/ supervision and w/o handrail use or HOB elevated  PT Short Term Goal 4 (Week 1): Pt will transfer w/ Min guard via stand pivot transfer using LRAD PT Short Term Goal 5 (Week 1): Pt will maintain dynamic sitting balance w/ supervision  Skilled Therapeutic Interventions/Progress Updates: Tx1:  Pt presented in bed awaiting breakfast agreeable to therapy. Performed bed mobilty mod I with features. PTA donned shoes for time management. Pt performed ambulatory transfer to toilet with supervision (+void) and performed hand hygiene and placed dentures in standing at sink. Pt ambulated to rehab gym with [facility] with min A, pt would lose balance when turning head/distracted. Pt required x 1 seated rest break due fatigue and feeling "overheated", denies dizziness or nausea, improved with sips of ice water. Pt returned to room to in same manner with [facility] and pt left at EOB to complete breakfast.   Tx2: Pt presented in bed stating recent episode of emesis but feeling better and agreeable to therapy. Performed supine to sit supervision and performed stand pivot to w/c for energy conservation and time management. Pt transported to rehab gym and performed stair training, ascend/descend x8 steps 6in with min guard with 2 rails. Performed ascending with step through pattern and descending with step to pattern. Performed NuStep L3 x 5 min for endurance with good tolerance. Pt returned to w/c and then handed off to OT for next session.    Tx3: Pt presented sitting at EOB c/o fatigue but agreeable to therapy. Ambulated to dayroom with RW supervision level. Performed standing balance activities including ball toss and ball toss on airex with min guard. Tactile cues for posterior lean correction. Pt required frequent breaks due to increased "discomfort"in low back which resolved with rest. Performed obstacle course with cues for increased L foot clearance when stepping over thresholds, and min verbal cues for safety when sidestepping with RW. Pt returned to room in same manner as prior and returned to bed supervision with dgt present and current needs met.       Therapy Documentation Precautions:  Precautions Precautions: Fall Restrictions Weight Bearing Restrictions: No  See Function Navigator for Current Functional Status.   Therapy/Group: Individual Therapy  Yarlin Breisch  Glada Wickstrom, PTA  03/18/2017, 11:57 AM

## 2017-03-18 NOTE — Progress Notes (Signed)
Social Work Patient ID: Ashley Chan, female   DOB: Dec 25, 1928, 83 y.o.   MRN: [id number]  Md is in agreement with pt's discharge on Sat. Pt and daughter aware of the new discharge date. Discussed Equipment and home health needs, both do not have a preference. Will look into home health agenicies that service [place]. Work on discharge needs.

## 2017-03-18 NOTE — Progress Notes (Signed)
[place] PHYSICAL MEDICINE & REHABILITATION     PROGRESS NOTE    Subjective/Complaints:  No problems overnight. Discussed with physical therapy and social work, may be ready to go home. Bit earlier than originally planned  ROS: pt denies nausea, vomiting, diarrhea, cough, shortness of breath or chest pain   Objective: Vital Signs: Blood pressure (!) 153/67, pulse 76, temperature 98.5 F (36.9 C), temperature source Oral, resp. rate 18, height 5\' 6"  (1.676 m), weight 80.1 kg (176 lb 8 oz), SpO2 96 %. No results found.  Recent Labs  03/18/17 0524  WBC 5.9  HGB 11.9*  HCT 35.7*  PLT 392    Recent Labs  03/18/17 0524  NA 139  K 3.8  CL 102  GLUCOSE 131*  BUN 17  CREATININE 0.89  CALCIUM 9.7   CBG (last 3)  No results for input(s): GLUCAP in the last 72 hours.  Wt Readings from Last 3 Encounters:  03/12/17 80.1 kg (176 lb 8 oz)  03/05/17 72.3 kg (159 lb 4.8 oz)    Physical Exam:  Constitutional: She is oriented to person, place, and time. She appears well-developed and well-nourished. No distress.  HENT:  Head: Normocephalic and atraumatic.  Mouth/Throat: Oropharynx is clear and moist.  Eyes: Pupils are equal, round, and reactive to light. Conjunctivae and EOM are normal.  Neck: Normal range of motion. Neck supple.  Cardiovascular: RRR without murmur. No JVD  .   Respiratory:CTA Bilaterally without wheezes or rales. Normal effort    GI: Soft. Bowel sounds are normal. She exhibits no distension. There is no tenderness.  Musculoskeletal: She exhibits edema (min edema bilateral feet). She exhibits no tenderness.  Heels without breakdown or swelling  Neurological: She is alert and oriented to person, place, and time. A cranial nerve deficit is present.  Speech clear. Delays in processing.  Able to follow basic commands without difficulty.  Motor: 5/5 grossly throughout RUE: Dysmetria is mild. CEY:EMVVKPQA Dysmetria and ataxia noted but able to hold fork and  feed self.  Skin: Skin is warm and dry. She is not diaphoretic.  Psychiatric: pleaasant  Assessment/Plan: 1. Balance deficits/gait disorder secondary to left cerebellar infarct which require 3+ hours per day of interdisciplinary therapy in a comprehensive inpatient rehab setting. Physiatrist is providing close team supervision and 24 hour management of active medical problems listed below. Physiatrist and rehab team continue to assess barriers to discharge/monitor patient progress toward functional and medical goals.  Function:  Bathing Bathing position   Position: Shower  Bathing parts Body parts bathed by patient: Right arm, Left arm, Chest, Abdomen, Front perineal area, Buttocks, Right upper leg, Left upper leg, Right lower leg, Left lower leg Body parts bathed by helper: Back  Bathing assist Assist Level: Supervision or verbal cues      Upper Body Dressing/Undressing Upper body dressing   What is the patient wearing?: Bra, Pull over shirt/dress Bra - Perfomed by patient: Thread/unthread right bra strap, Thread/unthread left bra strap, Hook/unhook bra (pull down sports bra)   Pull over shirt/dress - Perfomed by patient: Thread/unthread right sleeve, Thread/unthread left sleeve, Put head through opening, Pull shirt over trunk   Button up shirt - Perfomed by patient: Thread/unthread right sleeve, Thread/unthread left sleeve, Pull shirt around back, Button/unbutton shirt Button up shirt - Perfomed by helper: Pull shirt around back, Button/unbutton shirt    Upper body assist Assist Level: Set up   Set up : To obtain clothing/put away  Lower Body Dressing/Undressing Lower body dressing  What is the patient wearing?: Pants, Ted Hose, Shoes     Pants- Performed by patient: Thread/unthread right pants leg, Thread/unthread left pants leg, Pull pants up/down   Non-skid slipper socks- Performed by patient: Don/doff right sock, Don/doff left sock Non-skid slipper socks- Performed by  helper: Don/doff right sock, Don/doff left sock Socks - Performed by patient: Don/doff right sock, Don/doff left sock   Shoes - Performed by patient: Don/doff right shoe, Don/doff left shoe, Fasten right, Fasten left         TED Hose - Performed by helper: Don/doff right TED hose, Don/doff left TED hose  Lower body assist Assist for lower body dressing: Touching or steadying assistance (Pt > 75%)      Toileting Toileting Toileting activity did not occur: No continent bowel/bladder event Toileting steps completed by patient: Adjust clothing prior to toileting, Performs perineal hygiene, Adjust clothing after toileting Toileting steps completed by helper: Adjust clothing prior to toileting, Adjust clothing after toileting Toileting Assistive Devices: Grab bar or rail  Toileting assist Assist level: Supervision or verbal cues   Transfers Chair/bed transfer   Chair/bed transfer method: Stand pivot Chair/bed transfer assist level: Touching or steadying assistance (Pt > 75%) Chair/bed transfer assistive device: Armrests, Medical sales representative     Max distance: 100 Assist level: Touching or steadying assistance (Pt > 75%)   Wheelchair   Type: Manual Max wheelchair distance: 160 Assist Level: Dependent (Pt equals 0%)  Cognition Comprehension Comprehension assist level: Follows complex conversation/direction with extra time/assistive device  Expression Expression assist level: Expresses basic 90% of the time/requires cueing < 10% of the time.  Social Interaction Social Interaction assist level: Interacts appropriately 90% of the time - Needs monitoring or encouragement for participation or interaction.  Problem Solving Problem solving assist level: Solves basic 50 - 74% of the time/requires cueing 25 - 49% of the time  Memory Memory assist level: Recognizes or recalls 90% of the time/requires cueing < 10% of the time   Medical Problem List and Plan: 1.  Balance deficits,  gait abnormality, limitations in self-care secondary to left cerebellar infarct with hemorrhagic conversion.  -Cont CIR PT, OT, SLP 2.  DVT Prophylaxis/Anticoagulation: Pharmaceutical: Lovenox, plt normal 8/14 3. Pain Management: tylenol prn 4. Mood: LCSW to follow for evaluation and support.   5. Neuropsych: This patient is capable of making decisions on her own behalf. 6. Skin/Wound Care: routine pressure relief measures, heels look normal 7. Fluids/Electrolytes/Nutrition: Monitor I/O.  Marland Kitchen Push fluids to avoid orthostatic symptoms  -I personally reviewed the patient's labs today.    -seems to have reasonable appetite 8. ALP:FXTKWIO BP bid. Continue HCTZ, labetalol and Norvasc.   -improved control at present/ permissible HTN Vitals:   03/17/17 2223 03/18/17 0500  BP: 130/70 (!) 153/67  Pulse: 79 76  Resp:  18  Temp:  98.5 F (36.9 C)  SpO2:  96%   9. Colitis? with melena: offer fluids frequently to maintain adequate hydration. Monitor for recurrence and check H/H serially.   -hgb 11.8 8/14 10. Hypokalemia: Resolved with supplement. K+ 4.0 8/14  11. Dizziness:  vestibular eval ordered  -will need acclimation .    12. Constipation: sorbitol today, fleet enema prn, Last bowel movement 8/19   LOS (Days) 8 A FACE TO FACE EVALUATION WAS PERFORMED  Charlett Blake, MD 03/18/2017 9:19 AM

## 2017-03-18 NOTE — Evaluation (Signed)
Recreational Therapy Assessment and Plan  Patient Details  Name: Ashley Chan MRN: [id number] Date of Birth: 07/24/1929 Today's Date: 03/18/2017  Rehab Potential:  Good  ELOS:   discharge 8/25  Assessment Problem List:      Patient Active Problem List   Diagnosis Date Noted  . Cerebellar stroke, acute ([place]) 03/10/2017  . Benign neoplasm of transverse colon   . Colon ulcer   . Neurologic gait disorder   . Hypokalemia   . Dizziness and giddiness   . Coffee ground emesis   . Heme + stool   . Duodenitis   . Benign essential HTN   . Chronic midline low back pain without sciatica   . Anxiety state   . Ataxia, post-stroke   . ICH (intracerebral hemorrhage) ([facility]) -  Left superior cerebellar hematoma with increased vasogenic edema and mass effect on adjacent fourth ventricle without hydrocephalus 03/03/2017  . CVA (cerebral vascular accident) ([place]) 03/03/2017    Past Medical History:      Past Medical History:  Diagnosis Date  . Anxiety   . Ataxia, post-stroke   . Back pain   . Chronic midline low back pain without sciatica   . CVA (cerebral vascular accident) ([place]) 03/03/2017  . Hypertension   . Right hip pain    Past Surgical History:       Past Surgical History:  Procedure Laterality Date  . BACK SURGERY  2011  . CATARACT EXTRACTION, BILATERAL  1999  . COLONOSCOPY WITH PROPOFOL N/A 03/10/2017   Procedure: COLONOSCOPY WITH PROPOFOL;  Surgeon: Ladene Artist, MD;  Location: [facility] ENDOSCOPY;  Service: Endoscopy;  Laterality: N/A;  . ESOPHAGOGASTRODUODENOSCOPY N/A 03/09/2017   Procedure: ESOPHAGOGASTRODUODENOSCOPY (EGD);  Surgeon: [name], MD;  Location: [facility] ENDOSCOPY;  Service: Endoscopy;  Laterality: N/A;    Assessment & Plan Clinical Impression: Ashley Chan an87 y.o.left handed female who was admitted to OSH with reports of dizziness, malaise and coffec ground emesis. CT head unremarkable for acute process. Unable to get MRI  brain due to the weekend. 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 She was transferred to [facility] for treatment and care. She was started medications for tighter BP control and required fluid boluses due to poor UOP. Repeat CT head reviewed, stable midline shift. 2 D echo showed EF 60-655 with grade 2 diastolic dysfunction, moderate mitral valve calcification and moderate pulmonary HTN. CTA head/neck showed severe left carotid bifurcation calcified plaque with severe 70% proximal L-ICA stenosis as well as thyroid nodules up to 15 mm.   She did have bradycardic episode question due to vasovagal event as well as reports of purple/red stools. Stool guaiacs heme positive and patient developed coffee ground emesis. Dr. Carlean Purl consulted for input as patient with history of gastric ulcers. EGD done yesterday and showed duodenitis but rectal exam with hard stool and dark blood on digital exam. She underwent colonoscopy today revealing ten mm ulcer in sigmoid colon with erythema and edema--bx taken, internal hems, two polyps--resected and mild diverticulosis. GI recommended maintaining adequate hydration, avoiding NSAIDs x 2 weeks and suspected that ischemic colitis led to ulcer and heme positive stools.   Patient transferred to CIR on 03/10/2017.    Pt presents with decreased activity tolerance, decreased functional mobility, decreased balance, decreased coordination, vestibular disturbance Limiting pt's independence with leisure/community pursuits.  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 Other Leisure  Interests: Television;Housework;Cooking/Baking Leisure Participation Style: With Family/Friends Psychosocial / Spiritual Spiritual Interests: Church;Womens'Men's Groups Stress Management: Good Social interaction -  Mood/Behavior: Cooperative Engineer, drilling for Education?: Yes Recreational Therapy Orientation Orientation -Reviewed with patient: Available activity resources Strengths/Weaknesses Patient Strengths/Abilities: Willingness to participate Patient weaknesses: Physical limitations TR Patient demonstrates impairments in the following area(s): Endurance;Motor;Safety  Plan No further TR as pt is expected to discharge home 8/25.  Discussed leisure interests and importance of being active after discharge, including community pursuits. Pt stated understanding.  Recommendations for other services: None   Discharge Criteria: Patient will be discharged from TR if patient refuses treatment 3 consecutive times without medical reason.  If treatment goals not met, if there is a change in medical status, if patient makes no progress towards goals or if patient is discharged from hospital.  The above assessment, treatment plan, treatment alternatives and goals were discussed and mutually agreed upon: by patient  Ashley Chan 03/18/2017, 3:51 PM

## 2017-03-19 ENCOUNTER — Inpatient Hospital Stay (HOSPITAL_COMMUNITY): Payer: No Typology Code available for payment source | Admitting: Occupational Therapy

## 2017-03-19 ENCOUNTER — Inpatient Hospital Stay (HOSPITAL_COMMUNITY): Payer: No Typology Code available for payment source | Admitting: Physical Therapy

## 2017-03-19 NOTE — Progress Notes (Signed)
Occupational Therapy Session Note  Patient Details  Name: Ashley Chan MRN: [id number] Date of Birth: 1928/12/23  Today's Date: 03/19/2017 OT Individual Time: 0830-0930 OT Individual Time Calculation (min): 60 min    Short Term Goals: Week 2:  OT Short Term Goal 1 (Week 2): STG=LTG due to LOS  Skilled Therapeutic Interventions/Progress Updates:    Pt seen for OT ADL bathing/dressing session. Pt in supine upon arrival, ready for tx session and desiring to shower. She ambulated throughout room with RW and supervision. Completed toileting task with distant supervision. She ambulated into walk in shower and bathed seated on 3-1 BSC, standing to complete pericare/ buttock hygiene with supervision.  She returned to w/c to dress, requiring assist to fasten bra and supervision for standing clothing management tasks.  Following rest break, pt ambulated thorughout unit with RW and supervision, required to find various items along walls in order to increase head turning tolerance and dual task.  In therapy gym, pt completed 5x sit <> stand as part of vestibular HEP, able to complete without UE support. Pt left seated in gym per request at end of session to await PT session. She was able to self initiate rst breaks throughout session demonstrating good safety awareness. Discussed d/c planning and energy conservation throughout session.   Therapy Documentation Precautions:  Precautions Precautions: Fall Restrictions Weight Bearing Restrictions: No Pain:    See Function Navigator for Current Functional Status.   Therapy/Group: Individual Therapy  Lewis, Kardell Virgil C 03/19/2017, 7:06 AM

## 2017-03-19 NOTE — Progress Notes (Signed)
[place] PHYSICAL MEDICINE & REHABILITATION     PROGRESS NOTE    Subjective/Complaints:  amb in hall with OT  ROS: pt denies nausea, vomiting, diarrhea, cough, shortness of breath or chest pain   Objective: Vital Signs: Blood pressure (!) 129/56, pulse 76, temperature 98.5 F (36.9 C), temperature source Oral, resp. rate 18, height 5\' 6"  (1.676 m), weight 79.7 kg (175 lb 11.3 oz), SpO2 99 %. No results found.  Recent Labs  03/18/17 0524  WBC 5.9  HGB 11.9*  HCT 35.7*  PLT 392    Recent Labs  03/18/17 0524  NA 139  K 3.8  CL 102  GLUCOSE 131*  BUN 17  CREATININE 0.89  CALCIUM 9.7   CBG (last 3)  No results for input(s): GLUCAP in the last 72 hours.  Wt Readings from Last 3 Encounters:  03/19/17 79.7 kg (175 lb 11.3 oz)  03/05/17 72.3 kg (159 lb 4.8 oz)    Physical Exam:  Constitutional: She is oriented to person, place, and time. She appears well-developed and well-nourished. No distress.  HENT:  Head: Normocephalic and atraumatic.  Mouth/Throat: Oropharynx is clear and moist.  Eyes: Pupils are equal, round, and reactive to light. Conjunctivae and EOM are normal.  Neck: Normal range of motion. Neck supple.  Cardiovascular: RRR without murmur. No JVD  .   Respiratory:CTA Bilaterally without wheezes or rales. Normal effort    GI: Soft. Bowel sounds are normal. She exhibits no distension. There is no tenderness.  Musculoskeletal: She exhibits edema (min edema bilateral feet). She exhibits no tenderness.  Heels without breakdown or swelling  Neurological: She is alert and oriented to person, place, and time. A cranial nerve deficit is present.  Speech clear. Delays in processing.  Able to follow basic commands without difficulty.  Motor: 5/5 grossly throughout RUE: Dysmetria is mild. STM:HDQQIWLN Dysmetria and ataxia noted but able to hold fork and feed self.  Skin: Skin is warm and dry. She is not diaphoretic.  Psychiatric:  pleaasant  Assessment/Plan: 1. Balance deficits/gait disorder secondary to left cerebellar infarct which require 3+ hours per day of interdisciplinary therapy in a comprehensive inpatient rehab setting. Physiatrist is providing close team supervision and 24 hour management of active medical problems listed below. Physiatrist and rehab team continue to assess barriers to discharge/monitor patient progress toward functional and medical goals.  Function:  Bathing Bathing position   Position: (P) Shower  Bathing parts Body parts bathed by patient: (P) Right arm, Left arm, Chest, Abdomen, Front perineal area, Buttocks, Right upper leg, Left upper leg, Right lower leg, Left lower leg Body parts bathed by helper: (P) Back  Bathing assist Assist Level: (P) Supervision or verbal cues      Upper Body Dressing/Undressing Upper body dressing   What is the patient wearing?: (P) Bra, Pull over shirt/dress Bra - Perfomed by patient: (P) Thread/unthread right bra strap, Thread/unthread left bra strap Bra - Perfomed by helper: (P) Hook/unhook bra (pull down sports bra) Pull over shirt/dress - Perfomed by patient: (P) Thread/unthread right sleeve, Thread/unthread left sleeve, Put head through opening, Pull shirt over trunk   Button up shirt - Perfomed by patient: Thread/unthread right sleeve, Thread/unthread left sleeve, Pull shirt around back, Button/unbutton shirt Button up shirt - Perfomed by helper: Pull shirt around back, Button/unbutton shirt    Upper body assist Assist Level: (P) Set up   Set up : (P) To obtain clothing/put away  Lower Body Dressing/Undressing Lower body dressing   What is  the patient wearing?: (P) Pants, Ted Hose, Shoes     Pants- Performed by patient: (P) Thread/unthread right pants leg, Thread/unthread left pants leg, Pull pants up/down   Non-skid slipper socks- Performed by patient: Don/doff right sock, Don/doff left sock Non-skid slipper socks- Performed by helper:  Don/doff right sock, Don/doff left sock Socks - Performed by patient: Don/doff right sock, Don/doff left sock   Shoes - Performed by patient: Don/doff right shoe, Don/doff left shoe, Fasten right, Fasten left         TED Hose - Performed by helper: (P) Don/doff right TED hose, Don/doff left TED hose  Lower body assist Assist for lower body dressing: (P) Supervision or verbal cues      Toileting Toileting Toileting activity did not occur: No continent bowel/bladder event Toileting steps completed by patient: Adjust clothing prior to toileting, Performs perineal hygiene, Adjust clothing after toileting Toileting steps completed by helper: Adjust clothing prior to toileting, Adjust clothing after toileting Toileting Assistive Devices: Grab bar or rail  Toileting assist Assist level: Supervision or verbal cues   Transfers Chair/bed transfer   Chair/bed transfer method: Stand pivot Chair/bed transfer assist level: Touching or steadying assistance (Pt > 75%) Chair/bed transfer assistive device: Armrests, Medical sales representative     Max distance: 128ft Assist level: Touching or steadying assistance (Pt > 75%)   Wheelchair   Type: Manual Max wheelchair distance: 160 Assist Level: Dependent (Pt equals 0%)  Cognition Comprehension Comprehension assist level: Follows complex conversation/direction with extra time/assistive device  Expression Expression assist level: Expresses basic 90% of the time/requires cueing < 10% of the time.  Social Interaction Social Interaction assist level: Interacts appropriately 90% of the time - Needs monitoring or encouragement for participation or interaction.  Problem Solving Problem solving assist level: Solves basic 50 - 74% of the time/requires cueing 25 - 49% of the time  Memory Memory assist level: Recognizes or recalls 90% of the time/requires cueing < 10% of the time   Medical Problem List and Plan: 1.  Balance deficits, gait  abnormality, limitations in self-care secondary to left cerebellar infarct with hemorrhagic conversion.  -Cont CIR PT, OT, SLP 2.  DVT Prophylaxis/Anticoagulation: Pharmaceutical: Lovenox, plt normal 8/14 3. Pain Management: tylenol prn 4. Mood: LCSW to follow for evaluation and support.   5. Neuropsych: This patient is capable of making decisions on her own behalf. 6. Skin/Wound Care: routine pressure relief measures, heels look normal 7. Fluids/Electrolytes/Nutrition: Monitor I/O.  Marland Kitchen Push fluids to avoid orthostatic symptoms  -I personally reviewed the patient's labs today.    -seems to have reasonable appetite 8. IRC:VELFYBO BP bid. Continue HCTZ, labetalol and Norvasc.   -improved control at present/ permissible HTN Vitals:   03/18/17 2100 03/19/17 0344  BP: (!) 145/66 (!) 129/56  Pulse: 76 76  Resp:  18  Temp:  98.5 F (36.9 C)  SpO2:  99%   9. Colitis? with melena: offer fluids frequently to maintain adequate hydration. Monitor for recurrence and check H/H serially.   -hgb 11.8 8/14 10. Hypokalemia: Resolved with supplement. K+ 4.0 8/14  11. Dizziness:  vestibular eval ordered  -will need acclimation .    12. Constipation: sorbitol today, fleet enema prn,    LOS (Days) 9 A FACE TO FACE EVALUATION WAS PERFORMED  Charlett Blake, MD 03/19/2017 9:28 AM

## 2017-03-19 NOTE — Patient Care Conference (Signed)
Inpatient RehabilitationTeam Conference and Plan of Care Update Date: 03/19/2017   Time: 10:30 AM    Patient Name: Ashley Chan      Medical Record Number: [id number]  Date of Birth: Sep 09, 1928 Sex: Female         Room/Bed: 4W23C/4W23C-01 Payor Info: Payor: MEDICARE / Plan: MEDICARE PART A AND B / Product Type: *No Product type* /    Admitting Diagnosis: CVA  Admit Date/Time:  03/10/2017  4:46 PM Admission Comments: No comment available   Primary Diagnosis:  Ataxia, post-stroke Principal Problem: Ataxia, post-stroke  Patient Active Problem List   Diagnosis Date Noted  . Spinal stenosis in cervical region 03/14/2017  . Cerebellar stroke, acute ([place]) 03/10/2017  . Benign neoplasm of transverse colon   . Colon ulcer   . Neurologic gait disorder   . Hypokalemia   . Dizziness and giddiness   . Coffee ground emesis   . Heme + stool   . Duodenitis   . Benign essential HTN   . Chronic midline low back pain without sciatica   . Anxiety state   . Ataxia, post-stroke   . ICH (intracerebral hemorrhage) ([facility]) -  Left superior cerebellar hematoma with increased vasogenic edema and mass effect on adjacent fourth ventricle without hydrocephalus 03/03/2017  . CVA (cerebral vascular accident) ([place]) 03/03/2017    Expected Discharge Date: Expected Discharge Date: 03/22/17  Team Members Present: Physician leading conference: Dr. Alysia Penna Social Worker Present: Ovidio Kin, LCSW Nurse Present: Elliot Cousin, RN PT Present: Phylliss Bob, PTA;Barrie Folk, PT OT Present: Napoleon Form, OT SLP Present: Stormy Fabian, SLP PPS Coordinator present : Daiva Nakayama, RN, CRRN     Current Status/Progress Goal Weekly Team Focus  Medical   Left upper greater than lower limb ataxia, cognition is intact, no safety awareness deficits, denies any neck pains  Improved functional usage of left upper extremity, improve mobility reduce fall risk  Discharge planning   Bowel/Bladder   (P)  Continent of bowel and bladder (urgency), LBM 8-21  (P) Remain continent of bowel and bladder manintain regular bowel pattern  (P) Assist with toileting needs prn   Swallow/Nutrition/ Hydration             ADL's   Supervision-min A overall  Supervision- mod I overall  Functional balance and endurance, ADL/ IADL re-training, neuro re-ed, d/c planning, family ed   Mobility   Supervision transfers, gait with RW, min guard stairs B rails  supevision gait/transfers, Mod I bed mobility and w/c  Balance, endurance, d/c planning   Communication             Safety/Cognition/ Behavioral Observations            Pain   (P) no c/o pain   (P) no c/o pain  (P)  Assess pain q shift and prn    Skin   (P) edema to BLE, euercin cream for bilateral feet euercin cream in use  (P) no new skin issues         *See Care Plan and progress notes for long and short-term goals.     Barriers to Discharge  Current Status/Progress Possible Resolutions Date Resolved   Physician    Other (comments);Home environment access/layout  Patient needs to avoid falls due to severe cervical stenosis, need to upgrade gait stability  Progressing towards discharge  PT to emphasize  safety during ambulation      Nursing  PT                    OT                  SLP                SW     Pt at supervision-mod/i daughter can manage at this level          Discharge Planning/Teaching Needs:  Daughter has been here for education, pt reaching mod/i-supervision level. Moved up DC date to 8/25 due to reaching goals sooner      Team Discussion:  Working toward her supervision-mod/i level goals. She is doing well and medically stable for DC Sat. Have moved up date due to progressing more quickly. DC-Speech met her goals. Daughter has been here and attended therapies with pt.  Revisions to Treatment Plan:  Changed DC to 8/25.    Continued Need for Acute Rehabilitation Level of Care: The patient requires  daily medical management by a physician with specialized training in physical medicine and rehabilitation for the following conditions: Daily direction of a multidisciplinary physical rehabilitation program to ensure safe treatment while eliciting the highest outcome that is of practical value to the patient.: Yes Daily medical management of patient stability for increased activity during participation in an intensive rehabilitation regime.: Yes Daily analysis of laboratory values and/or radiology reports with any subsequent need for medication adjustment of medical intervention for : Neurological problems  Bernarr Longsworth, Gardiner Rhyme 03/19/2017, 1:40 PM

## 2017-03-19 NOTE — Progress Notes (Signed)
Physical Therapy Session Note  Patient Details  Name: Ashley Chan MRN: [id number] Date of Birth: [birth year]  Today's Date: 03/19/2017 PT Individual Time: 0177-9390 ADn 1420-1545 PT Individual Time Calculation (min): 44 min AND 85 min   Short Term Goals: Week 1:  PT Short Term Goal 1 (Week 1): Pt will ambulate 100' w/ Min A using LRAD PT Short Term Goal 2 (Week 1): Pt will negotiate 4 steps w/ Mod A PT Short Term Goal 3 (Week 1): Pt will all bed mobility w/ supervision and w/o handrail use or HOB elevated  PT Short Term Goal 4 (Week 1): Pt will transfer w/ Min guard via stand pivot transfer using LRAD PT Short Term Goal 5 (Week 1): Pt will maintain dynamic sitting balance w/ supervision  Skilled Therapeutic Interventions/Progress Updates:   Pt received sitting EOM in rehab gym and agreeable to PT  PT instructed pt in gait training with RW through hall of rehab unit with supervision assist from PT x 159f, 1721f [id number] [id number] noted to have no LOB but did required min cues for awareness of location in hospital.   Standing balance training.  Vertical ball toss 4 x 30sec with min-supervision assist from Pt. Pt noted to have increased difficult with use of LUE compared to the L. No LOB noted.  Ball toss off trampoline 5 x 30sec with min assist from PT. 3 instances of postrerior LOB, but able to correct without additional assistance.   Pt returned to room and perform BLE neuro re-ed with level 2 tband  Hip abduction x10 BLE Reciprocal marches x 10 BLE LAQ x 10 BLE  HS curl x 10 BLE  Min cues from PT for improved Eccentric control and decreased compensation through trunk     Pt left sitting EOB with call bell in reach all needs met.     [name] received supine in bed and agreeable to PT. Supine>sit transfer without assist or cues.   Gait in room with RW and supervision assist to bathroom for continent bladder evacuation. No additional assistance with toileting or hand  hygiene.   [facility] Gait training with RW on unlevel cement sidewalk x 150ft35f supervision assist from PT. Pt required min cues for attention to task to prevent dizziness from decreased visual stabilization.  PT also instructed pt in gait training with RW though crowded environment of hospital entrance and waiting areas 100ft 81fand 150ft w61fsupervision assist from PT. Pt required 1 seated and 1 standing rest break due to fatigue.   [facility]. Nustep endurance training x 8 minutes with 1 rest break level 2>3.   Pt instructed by PT in modified [place] level B balance exercise program.  Sit<>stand x 12 minisquat x 10 with BUE support  Forward/backward gait 10ft x 5fde stepping 10 ft x 2  Gait in figure 8, 8ft apar60f 2  Tandem stand 1 ue support x 10sec x 4.   Patient returned too room and left sitting in WC [facility] in reach and all needs met.       Therapy Documentation Precautions:  Precautions Precautions: Fall Restrictions Weight Bearing Restrictions: No Pain: Pain Assessment Pain Assessment: 0-10 Pain Score: 7  Pain Type: Acute pain Pain Location: Shoulder Pain Orientation: Right Pain Descriptors / Indicators: Aching Pain Onset: On-going Patients Stated Pain Goal: 2 Pain Intervention(s): Medication (  See eMAR);Heat applied   See Function Navigator for Current Functional Status.   Therapy/Group: Individual Therapy  [name] 03/19/2017, 12:45 PM

## 2017-03-19 NOTE — Progress Notes (Signed)
Social Work Patient ID: Ashley Chan, female   DOB: [birth year], 83 y.o.   MRN: [id number]  Met with pt and daughter to discuss team conference aware discharge moved up due to progress by pt. Both agreeable and feels Ready for home on Sat. Pt will be home for her birthday on Tuesday. Equipment and home health being arranged for Sat.

## 2017-03-19 NOTE — Progress Notes (Signed)
Social Work Senay Sistrunk, Eliezer Champagne Social Worker Signed   Patient Care Conference Date of Service: 03/19/2017  1:39 PM      Hide copied text Hover for attribution information Inpatient RehabilitationTeam Conference and Plan of Care Update Date: 03/19/2017   Time: 10:30 AM      Patient Name: Ashley Chan      Medical Record Number: [id number]  Date of Birth: [birth year] Sex: Female         Room/Bed: 4W23C/4W23C-01 Payor Info: Payor: MEDICARE / Plan: MEDICARE PART A AND B / Product Type: *No Product type* /     Admitting Diagnosis: CVA  Admit Date/Time:  03/10/2017  4:46 PM Admission Comments: No comment available    Primary Diagnosis:  Ataxia, post-stroke Principal Problem: Ataxia, post-stroke       Patient Active Problem List    Diagnosis Date Noted  . Spinal stenosis in cervical region 03/14/2017  . Cerebellar stroke, acute ([place]) 03/10/2017  . Benign neoplasm of transverse colon    . Colon ulcer    . Neurologic gait disorder    . Hypokalemia    . Dizziness and giddiness    . Coffee ground emesis    . Heme + stool    . Duodenitis    . Benign essential HTN    . Chronic midline low back pain without sciatica    . Anxiety state    . Ataxia, post-stroke    . ICH (intracerebral hemorrhage) (HCC) -  Left superior cerebellar hematoma with increased vasogenic edema and mass effect on adjacent fourth ventricle without hydrocephalus 03/03/2017  . CVA (cerebral vascular accident) ([place]) 03/03/2017      Expected Discharge Date: Expected Discharge Date: 03/22/17   Team Members Present: Physician leading conference: Dr. Alysia Penna Social Worker Present: Ovidio Kin, LCSW Nurse Present: Elliot Cousin, RN PT Present: Phylliss Bob, PTA;Barrie Folk, PT OT Present: Napoleon Form, OT SLP Present: Stormy Fabian, SLP PPS Coordinator present : Daiva Nakayama, RN, CRRN       Current Status/Progress Goal Weekly Team Focus  Medical     Left upper greater than lower limb ataxia,  cognition is intact, no safety awareness deficits, denies any neck pains  Improved functional usage of left upper extremity, improve mobility reduce fall risk  Discharge planning   Bowel/Bladder     (P) Continent of bowel and bladder (urgency), LBM 8-21  (P) Remain continent of bowel and bladder manintain regular bowel pattern  (P) Assist with toileting needs prn   Swallow/Nutrition/ Hydration               ADL's     Supervision-min A overall  Supervision- mod I overall  Functional balance and endurance, ADL/ IADL re-training, neuro re-ed, d/c planning, family ed   Mobility     Supervision transfers, gait with RW, min guard stairs B rails  supevision gait/transfers, Mod I bed mobility and w/c  Balance, endurance, d/c planning   Communication               Safety/Cognition/ Behavioral Observations             Pain     (P) no c/o pain   (P) no c/o pain  (P)  Assess pain q shift and prn    Skin     (P) edema to BLE, euercin cream for bilateral feet euercin cream in use  (P) no new skin issues        *See Care Plan and progress  notes for long and short-term goals.      Barriers to Discharge   Current Status/Progress Possible Resolutions Date Resolved   Physician     Other (comments);Home environment access/layout  Patient needs to avoid falls due to severe cervical stenosis, need to upgrade gait stability  Progressing towards discharge  PT to emphasize  safety during ambulation      Nursing                 PT                    OT                 SLP            SW  Pt at supervision-mod/i daughter can manage at this level            Discharge Planning/Teaching Needs:  Daughter has been here for education, pt reaching mod/i-supervision level. Moved up DC date to 8/25 due to reaching goals sooner      Team Discussion:  Working toward her supervision-mod/i level goals. She is doing well and medically stable for DC Sat. Have moved up date due to progressing more quickly.  DC-Speech met her goals. Daughter has been here and attended therapies with pt.  Revisions to Treatment Plan:  Changed DC to 8/25.    Continued Need for Acute Rehabilitation Level of Care: The patient requires daily medical management by a physician with specialized training in physical medicine and rehabilitation for the following conditions: Daily direction of a multidisciplinary physical rehabilitation program to ensure safe treatment while eliciting the highest outcome that is of practical value to the patient.: Yes Daily medical management of patient stability for increased activity during participation in an intensive rehabilitation regime.: Yes Daily analysis of laboratory values and/or radiology reports with any subsequent need for medication adjustment of medical intervention for : Neurological problems   Sonakshi Rolland, Gardiner Rhyme 03/19/2017, 1:40 PM          Meggen Spaziani, Gardiner Rhyme, LCSW Social Worker Signed   Patient Care Conference Date of Service: 03/12/2017 12:34 PM      Hide copied text Hover for attribution information Inpatient RehabilitationTeam Conference and Plan of Care Update Date: 03/12/2017   Time: 10:45 AM      Patient Name: Ashley Chan      Medical Record Number: [id number]  Date of Birth: 1929/02/10 Sex: Female         Room/Bed: 4W23C/4W23C-01 Payor Info: Payor: MEDICARE / Plan: MEDICARE PART A AND B / Product Type: *No Product type* /     Admitting Diagnosis: CVA  Admit Date/Time:  03/10/2017  4:46 PM Admission Comments: No comment available    Primary Diagnosis:  <principal problem not specified> Principal Problem: <principal problem not specified>       Patient Active Problem List    Diagnosis Date Noted  . Cerebellar stroke, acute ([place]) 03/10/2017  . Benign neoplasm of transverse colon    . Colon ulcer    . Neurologic gait disorder    . Hypokalemia    . Dizziness and giddiness    . Coffee ground emesis    . Heme + stool    . Duodenitis    . Benign  essential HTN    . Chronic midline low back pain without sciatica    . Anxiety state    . Ataxia, post-stroke    . ICH (intracerebral hemorrhage) (HCC) -  Left superior cerebellar hematoma with increased vasogenic edema and mass effect on adjacent fourth ventricle without hydrocephalus 03/03/2017  . CVA (cerebral vascular accident) ([place]) 03/03/2017      Expected Discharge Date: Expected Discharge Date: 03/25/17   Team Members Present: Physician leading conference: Dr. Alysia Penna Social Worker Present: Ovidio Kin, LCSW Nurse Present: Elliot Cousin, RN PT Present: Leavy Cella, PT;Rosita Dechalus, PTA OT Present: Napoleon Form, OT SLP Present: Windell Moulding, SLP PPS Coordinator present : Daiva Nakayama, RN, CRRN       Current Status/Progress Goal Weekly Team Focus  Medical     left hemiataxia, cognition intact  maintain med stability  initiating therapy program   Bowel/Bladder     cont of b/b; lbm 8/13; urgency  maintain continence during rehab stay  monitor b/b continence q shift and prn   Swallow/Nutrition/ Hydration               ADL's     Min-mod A overall  Supervision- mod I overall  Neuro re-ed, ADL re-training, functional sitting/ standing balance, activity tolerance   Mobility     Mod A w/c, Min A transfers and gait, ataxic w/ all mobility   supevision gait/transfers, Mod I bed mobility and w/c  balance, ataxia, functional mobility, endurance, LE strength    Communication               Safety/Cognition/ Behavioral Observations             Pain     denies pain  <2 out of 10 duirng rehab stay  assess pain q shift and prn   Skin     edema to BLE; boggy heels; prevalon boots; eucerin cream for bilat feet  skin free of infection and breakdown during rehab stay  assess skin q shift and prn     *See Care Plan and progress notes for long and short-term goals.      Barriers to Discharge   Current Status/Progress Possible Resolutions Date Resolved   Physician     Medical  stability;Other (comments)  vestibular symptoms, constipation , urine is cloudy  initiating rehab, no SLP needs identified  manage bowel and bladder      Nursing                 PT  Decreased caregiver support;Lack of/limited family support  Daughters unable to physically assist, however able to provide 24/7 supervision              OT                 SLP            SW Lack of/limited family support Dauhgter using a walker due to her own health issues,can not provide any physical assist.             Discharge Planning/Teaching Needs:  HOme with daughter who uses a walker due to needing B-THR, so only can provide supervision level while other daughter works. Daughter stays here with her.      Team Discussion:  Goals supervision-mod/i level. Vertigo main issue and will have vestibular evaluation today. Speech evaluation and no need to follow here. Urgency with bladder, tolieting frequently. MD checking UA per RN request. Ataxia per PT, progressing already in therapies.  Revisions to Treatment Plan:  DC 8/28    Continued Need for Acute Rehabilitation Level of Care: The patient requires daily medical management by a physician with specialized training in physical medicine  and rehabilitation for the following conditions: Daily direction of a multidisciplinary physical rehabilitation program to ensure safe treatment while eliciting the highest outcome that is of practical value to the patient.: Yes Daily medical management of patient stability for increased activity during participation in an intensive rehabilitation regime.: Yes Daily analysis of laboratory values and/or radiology reports with any subsequent need for medication adjustment of medical intervention for : Neurological problems;Urological problems   Elease Hashimoto 03/12/2017, 12:34 PM           Patient ID: Stephani Police, female   DOB: 1929/01/25, 83 y.o.   MRN: [id number]

## 2017-03-20 ENCOUNTER — Inpatient Hospital Stay (HOSPITAL_COMMUNITY): Payer: No Typology Code available for payment source | Admitting: Physical Therapy

## 2017-03-20 ENCOUNTER — Inpatient Hospital Stay (HOSPITAL_COMMUNITY): Payer: No Typology Code available for payment source | Admitting: Occupational Therapy

## 2017-03-20 ENCOUNTER — Inpatient Hospital Stay (HOSPITAL_COMMUNITY): Payer: No Typology Code available for payment source

## 2017-03-20 MED ORDER — ATORVASTATIN CALCIUM 40 MG PO TABS: 40.0000 mg | ORAL_TABLET | Freq: Every day | ORAL | 0 refills | Status: DC

## 2017-03-20 MED ORDER — CLONAZEPAM 0.5 MG PO TABS: 0.5000 mg | ORAL_TABLET | Freq: Every day | ORAL | 0 refills | Status: DC | PRN

## 2017-03-20 MED ORDER — MUSCLE RUB 10-15 % EX CREA
TOPICAL_CREAM | Freq: Two times a day (BID) | CUTANEOUS | Status: DC | PRN
  Filled 2017-03-20: qty 85

## 2017-03-20 MED ORDER — AMLODIPINE BESYLATE 10 MG PO TABS: 10.0000 mg | ORAL_TABLET | Freq: Every day | ORAL | 0 refills | Status: DC

## 2017-03-20 MED ORDER — TROLAMINE SALICYLATE 10 % EX CREA
TOPICAL_CREAM | Freq: Two times a day (BID) | CUTANEOUS | Status: DC | PRN
  Filled 2017-03-20: qty 85

## 2017-03-20 MED ORDER — TRAMADOL HCL 50 MG PO TABS: 50.0000 mg | ORAL_TABLET | Freq: Two times a day (BID) | ORAL | 0 refills | Status: DC | PRN

## 2017-03-20 MED ORDER — HYDROCHLOROTHIAZIDE 25 MG PO TABS: 25.0000 mg | ORAL_TABLET | Freq: Every day | ORAL | 0 refills | Status: DC

## 2017-03-20 MED ORDER — POTASSIUM CHLORIDE ER 10 MEQ PO TBCR: 10.0000 meq | EXTENDED_RELEASE_TABLET | Freq: Every day | ORAL | 0 refills | Status: AC

## 2017-03-20 MED ORDER — PANTOPRAZOLE SODIUM 40 MG PO TBEC: 40.0000 mg | DELAYED_RELEASE_TABLET | Freq: Every day | ORAL | 0 refills | Status: DC

## 2017-03-20 MED ORDER — LABETALOL HCL 100 MG PO TABS: 100.0000 mg | ORAL_TABLET | Freq: Two times a day (BID) | ORAL | 0 refills | Status: DC

## 2017-03-20 NOTE — Progress Notes (Signed)
Social Work  Discharge Note  The overall goal for the admission was met for:   Discharge location: Yes-HOME WITH DAUGHTER WHO CAN PROVIDE 24 HR SUPERVISION   Length of Stay: Yes-12 DAYS  Discharge activity level: Yes-SUPERVISION LEVEL  Home/community participation: Yes  Services provided included: MD, RD, PT, OT, SLP, RN, CM, Pharmacy and SW  Financial Services: Medicare and Private Insurance: [employer]  Follow-up services arranged: Home Health: [facility]-PT & OT, DME: [facility] and Patient/Family has no preference for HH/DME agencies  Comments (or additional information):DUAGHTER HERE DAILY AND ATTENDING THERAPIES WITH PT. PT DID VERY WELL AND WAS READY TO [place]. BOTH COMFORTABLE WITH HER CARE AND DC.  Patient/Family verbalized understanding of follow-up arrangements: Yes  Individual responsible for coordination of the follow-up plan: SELF & DORIS-DAUGHTER  Confirmed correct DME delivered: Elease Hashimoto 03/20/2017    Elease Hashimoto

## 2017-03-20 NOTE — Progress Notes (Signed)
Occupational Therapy Session Note  Patient Details  Name: Ashley Chan MRN: [id number] Date of Birth: [birth year]  Today's Date: 03/20/2017 OT Individual Time: 1100-1200 OT Individual Time Calculation (min): 60 min    Short Term Goals: Week 2:  OT Short Term Goal 1 (Week 2): STG=LTG due to LOS  Skilled Therapeutic Interventions/Progress Updates:    Pt seen for OT ADL bathing/dressing session. Pt in supine upon arrival, agreeable to tx sessiona nd esireing to shower.  She ambulated thorughout room and completed functional transfers with distant supervision. She batehd sit <> stand from [facility] in shower. Returned to w/c to dress, requiring min A to fasten bra strap, supervision- mod I for all other dressing tasks.  Following seated rest break, pt completed grooming tasks standing at sink, using sink ledge for balance/ energy cosnervation. Pt left seated in w/c at end of session, all needs in reach. Throughout session, education provided regarding energy conservation, fall risk, and d/c planning.   Therapy Documentation Precautions:  Precautions Precautions: Fall Restrictions Weight Bearing Restrictions: No Pain:   No/ denies pain  See Function Navigator for Current Functional Status.   Therapy/Group: Individual Therapy  Lewis, Aileena Iglesia C 03/20/2017, 7:11 AM

## 2017-03-20 NOTE — Progress Notes (Signed)
[facility] PHYSICAL MEDICINE & REHABILITATION     PROGRESS NOTE    Subjective/Complaints:  Seen at bedside, complaining of some left upper abdominal pain. During the early part of her hospitalization. She did have recurrent vomiting. He vomited once yesterday. Denies any falls in relation to her stroke  ROS: pt denies nausea, vomiting, diarrhea, cough, shortness of breath or chest pain   Objective: Vital Signs: Blood pressure (!) 142/72, pulse 83, temperature 98.8 F (37.1 C), temperature source Oral, resp. rate 18, height 5\' 6"  (1.676 m), weight 79.7 kg (175 lb 11.3 oz), SpO2 99 %. No results found.  Recent Labs  03/18/17 0524  WBC 5.9  HGB 11.9*  HCT 35.7*  PLT 392    Recent Labs  03/18/17 0524  NA 139  K 3.8  CL 102  GLUCOSE 131*  BUN 17  CREATININE 0.89  CALCIUM 9.7   CBG (last 3)  No results for input(s): GLUCAP in the last 72 hours.  Wt Readings from Last 3 Encounters:  03/19/17 79.7 kg (175 lb 11.3 oz)  03/05/17 72.3 kg (159 lb 4.8 oz)    Physical Exam:  Constitutional: She is oriented to person, place, and time. She appears well-developed and well-nourished. No distress.  HENT:  Head: Normocephalic and atraumatic.  Mouth/Throat: Oropharynx is clear and moist.  Eyes: Pupils are equal, round, and reactive to light. Conjunctivae and EOM are normal.  Neck: Normal range of motion. Neck supple.  Cardiovascular: RRR without murmur. No JVD  .   Respiratory:CTA Bilaterally without wheezes or rales. Normal effort    GI: Soft. Bowel sounds are normal. She exhibits no distension. There is no tenderness.  Musculoskeletal: She exhibits edema (min edema bilateral feet). She exhibits no tenderness.  Heels without breakdown or swelling  Neurological: She is alert and oriented to person, place, and time. A cranial nerve deficit is present.  Speech clear. Delays in processing.  Able to follow basic commands without difficulty.  Motor: 5/5 grossly throughout RUE:  Dysmetria is mild. FKC:LEXNTZGY Dysmetria and ataxia noted but able to hold fork and feed self.  Skin: Skin is warm and dry. She is not diaphoretic.  Psychiatric: pleaasant  Assessment/Plan: 1. Balance deficits/gait disorder secondary to left cerebellar infarct which require 3+ hours per day of interdisciplinary therapy in a comprehensive inpatient rehab setting. Physiatrist is providing close team supervision and 24 hour management of active medical problems listed below. Physiatrist and rehab team continue to assess barriers to discharge/monitor patient progress toward functional and medical goals.  Function:  Bathing Bathing position   Position: Shower  Bathing parts Body parts bathed by patient: Right arm, Left arm, Chest, Abdomen, Front perineal area, Buttocks, Right upper leg, Left upper leg, Right lower leg, Left lower leg Body parts bathed by helper: Back  Bathing assist Assist Level: Supervision or verbal cues      Upper Body Dressing/Undressing Upper body dressing   What is the patient wearing?: Bra, Pull over shirt/dress Bra - Perfomed by patient: Thread/unthread right bra strap, Thread/unthread left bra strap Bra - Perfomed by helper: Hook/unhook bra (pull down sports bra) Pull over shirt/dress - Perfomed by patient: Thread/unthread right sleeve, Thread/unthread left sleeve, Put head through opening, Pull shirt over trunk   Button up shirt - Perfomed by patient: Thread/unthread right sleeve, Thread/unthread left sleeve, Pull shirt around back, Button/unbutton shirt Button up shirt - Perfomed by helper: Pull shirt around back, Button/unbutton shirt    Upper body assist Assist Level: Set up  Set up : To obtain clothing/put away  Lower Body Dressing/Undressing Lower body dressing   What is the patient wearing?: Pants, Ted Hose, Shoes     Pants- Performed by patient: Thread/unthread right pants leg, Thread/unthread left pants leg, Pull pants up/down   Non-skid slipper  socks- Performed by patient: Don/doff right sock, Don/doff left sock Non-skid slipper socks- Performed by helper: Don/doff right sock, Don/doff left sock Socks - Performed by patient: Don/doff right sock, Don/doff left sock   Shoes - Performed by patient: Don/doff right shoe, Don/doff left shoe, Fasten right, Fasten left         TED Hose - Performed by helper: Don/doff right TED hose, Don/doff left TED hose  Lower body assist Assist for lower body dressing: Supervision or verbal cues      Toileting Toileting Toileting activity did not occur: No continent bowel/bladder event Toileting steps completed by patient: Adjust clothing prior to toileting, Performs perineal hygiene, Adjust clothing after toileting Toileting steps completed by helper: Adjust clothing prior to toileting, Adjust clothing after toileting Toileting Assistive Devices: Grab bar or rail  Toileting assist Assist level: Supervision or verbal cues   Transfers Chair/bed transfer   Chair/bed transfer method: Stand pivot Chair/bed transfer assist level: Supervision or verbal cues Chair/bed transfer assistive device: Medical sales representative     Max distance: 152ft  Assist level: Supervision or verbal cues   Wheelchair   Type: Manual Max wheelchair distance: 160 Assist Level: Dependent (Pt equals 0%)  Cognition Comprehension Comprehension assist level: Follows complex conversation/direction with extra time/assistive device  Expression Expression assist level: Expresses basic needs/ideas: With extra time/assistive device  Social Interaction Social Interaction assist level: Interacts appropriately with others with medication or extra time (anti-anxiety, antidepressant).  Problem Solving Problem solving assist level: Solves basic 50 - 74% of the time/requires cueing 25 - 49% of the time  Memory Memory assist level: Recognizes or recalls 90% of the time/requires cueing < 10% of the time   Medical Problem List  and Plan: 1.  Balance deficits, gait abnormality, limitations in self-care secondary to left cerebellar infarct with hemorrhagic conversion.  -Cont CIR PT, OT, SLP, Progressing well towards goals 2.  DVT Prophylaxis/Anticoagulation: Pharmaceutical: Lovenox, plt normal 8/21 3. Pain Management: tylenol prn 4. Mood: LCSW to follow for evaluation and support.   5. Neuropsych: This patient is capable of making decisions on her own behalf. 6. Skin/Wound Care: routine pressure relief measures, heels look normal 7. Fluids/Electrolytes/Nutrition: Monitor I/O.  Marland Kitchen Push fluids to avoid orthostatic symptoms  -I personally reviewed the patient's labs today.    -seems to have reasonable appetite 8. OHY:WVPXTGG BP bid. Continue HCTZ, labetalol and Norvasc.   -improved control at present/ permissible HTN Vitals:   03/19/17 1417 03/20/17 0426  BP: 129/67 (!) 142/72  Pulse: 72 83  Resp: 16 18  Temp: 98.1 F (36.7 C) 98.8 F (37.1 C)  SpO2: 98% 99%   9. Colitis? with melena: offer fluids frequently to maintain adequate hydration. Monitor for recurrence and check H/H serially.   -hgb 11.9 on 03/18/2017 10. Hypokalemia: Resolved with supplement. K+ 4.0 8/14  11. Dizziness:  vestibular eval ordered  -will need acclimation .    12. Constipation: sorbitol today, fleet enema prn,    LOS (Days) [place] E, MD 03/20/2017 9:37 AM

## 2017-03-20 NOTE — Progress Notes (Signed)
Physical Therapy Session Note  Patient Details  Name: Ashley Chan MRN: [id number] Date of Birth: [birth year]  Today's Date: 03/20/2017 PT Individual Time: 5427-0623  7628-3151 PT Individual Time Calculation (min): 38 min AND 40 min    Short Term Goals: Week 1:  PT Short Term Goal 1 (Week 1): Pt will ambulate 100' w/ Min A using LRAD PT Short Term Goal 2 (Week 1): Pt will negotiate 4 steps w/ Mod A PT Short Term Goal 3 (Week 1): Pt will all bed mobility w/ supervision and w/o handrail use or HOB elevated  PT Short Term Goal 4 (Week 1): Pt will transfer w/ Min guard via stand pivot transfer using LRAD PT Short Term Goal 5 (Week 1): Pt will maintain dynamic sitting balance w/ supervision  Skilled Therapeutic Interventions/Progress Updates:   Pt received sitting EOB and agreeable to PT once she completed breakfast. Pt allowed 5 minutes to finish breakfast. PT instructed pt in gait training through hall 167f, 572fand [id number] RW and distant supervision assist from PT for safety.   PT instructed pt in dynamic balance training to toss 8 horse shoes and then retireve horse shoes from graound with 1 UE support on RW.  Additional standing balance training with Rotational pertubations to toss beach ball, 2 bouts x  1 minute. Multiple rest break due to upset stomach and fatigue.  Supervision assist from PT throughout balance and endurance training.   Patient returned to room and left sitting in  EOB with call bell in reach and all needs met.    Session 2.   Pt received supine in bed and agreeable to PT. Supine>sit transfer without assist or cues from PT.   Pt perform ambulatory transfer with RW to bathroom for continent bladder evacuation. No assist from PT to perform personal hygiene.   Gait training to and from rehab gym with distant supervision assist from PT [place] RW. Pt requires min cues for navigation in semi-familar environment, 2x [id number]   Pt instructed pt in standing  balance training and LUE coordination to hit beach ball to therapist with BUE using 1lb bar weight. 5 x 1 minute with distant supervision assist from PT. One near LOB, but appropriate use of ankle strategy to prevent LOB.  Reciprocal foot taps on 3 inch step x 10 BLE with 1 UE support and supervision assist. X 5 BLE with min assist from PT. Pt noted to require increased assistance when standing on LLE.   Pt returned to room and performed ambulatory transfer to bed with supervision assist. Sit>supine completed without assist and pt left supine in bed with call bell in reach and all needs met.           Therapy Documentation Precautions:  Precautions Precautions: Fall Restrictions Weight Bearing Restrictions: No Pain: no complaints.   See Function Navigator for Current Functional Status.   Therapy/Group: Individual Therapy  AustLorie Phenix3/2018, 8:46 AM

## 2017-03-20 NOTE — Progress Notes (Signed)
Physical Therapy Session Note/Vestibular follow up  Patient Details  Name: Ashley Chan MRN: [id number] Date of Birth: Dec 18, 1928  Today's Date: 03/20/2017 PT Individual Time: 1300-1356 PT Individual Time Calculation (min): 56 min   Short Term Goals: Week 1:  PT Short Term Goal 1 (Week 1): Pt will ambulate 100' w/ Min A using LRAD PT Short Term Goal 2 (Week 1): Pt will negotiate 4 steps w/ Mod A PT Short Term Goal 3 (Week 1): Pt will all bed mobility w/ supervision and w/o handrail use or HOB elevated  PT Short Term Goal 4 (Week 1): Pt will transfer w/ Min guard via stand pivot transfer using LRAD PT Short Term Goal 5 (Week 1): Pt will maintain dynamic sitting balance w/ supervision  Skilled Therapeutic Interventions/Progress Updates:    Patient seen for vestibular treatment to update HEP prior to planned d/c this weekend.  Patient relates practicing HEP as initial and demonstrated 5x reaching to R foot and returning to upright and reaching to L foot and returning upright, then rested prior to performing sit <> stand x 5 no UE support.  She relates no increased symptoms except fatigue.  Educated on issue of fatigue with symptoms and need for frequent rest to avoid symptom progression.  She relates feels symptoms are some better since last session.  Able to ambulate with RW S x 175' without LOB.  Performed standing balance activities to include: on balance foam with socks x 30 sec and 60 sec x 2 (initially with socks, then with her shoes) with close S to minguard due to LOB posterior initially x 2' then performed head turns x 10, head nods x 10 and eyes closed feet apart 30 sec, then feet together 30 sec.  Each with rest break in between.  Symptoms mainly noted with head nods in standing.  Issued to HEP in addition to initial standing feet apart head nods x 10, then feet together eyes closed 30 sec.  Educated on safety with HEP including close S, near wall, corner or sturdy surface behind and  walker in front.  Patient verbalized understanding of all.  Ambulated 40' no device min A for balance due to unsteadiness, then with RW x 120', then with rollator x 120' cues for technique and practiced locking and sitting on seat.  Consulted with SW regarding equipment recommendations previously made and cost differential for rollator.  She will follow up with pt.  Pt left sitting EOB with call bell in reach.   Therapy Documentation Precautions:  Precautions Precautions: Fall Restrictions Weight Bearing Restrictions: No Pain: Pain Assessment Faces Pain Scale: Hurts a little bit Pain Type: Acute pain Pain Location: Shoulder Pain Orientation: Right Pain Descriptors / Indicators: Aching Pain Onset: With Activity Pain Intervention(s): Repositioned (trial rollator over std RW)   See Function Navigator for Current Functional Status.   Therapy/Group: Individual Therapy  Reginia Naas  [place], [place] 03/20/2017  03/20/2017, 4:50 PM

## 2017-03-20 NOTE — Progress Notes (Signed)
Social Work Patient ID: Ashley Chan, female   DOB: 1929-03-05, 83 y.o.   MRN: [id number]  Met with pt and daughter to discuss rollator instead of a rolling walker. Pt would need to private pay for this since she  Is getting a transport chair. Informed them of cost and pt will stay with regular rolling walker. Ready to go home tomorrow.

## 2017-03-21 ENCOUNTER — Inpatient Hospital Stay (HOSPITAL_COMMUNITY): Payer: No Typology Code available for payment source | Admitting: Physical Therapy

## 2017-03-21 ENCOUNTER — Inpatient Hospital Stay (HOSPITAL_COMMUNITY): Payer: No Typology Code available for payment source | Admitting: Occupational Therapy

## 2017-03-21 ENCOUNTER — Inpatient Hospital Stay (HOSPITAL_COMMUNITY): Payer: No Typology Code available for payment source

## 2017-03-21 DIAGNOSIS — M4802 Spinal stenosis, cervical region: Secondary | ICD-10-CM

## 2017-03-21 NOTE — Progress Notes (Signed)
Occupational Therapy Discharge Summary  Patient Details  Name: Ashley Chan MRN: [id number] Date of Birth: Sep 18, 1928    Patient has met 10 of 10 long term goals due to improved activity tolerance, improved balance, postural control, ability to compensate for deficits, functional use of  LEFT upper extremity and improved coordination.  Patient to discharge at overall Modified Independent level with supervision for shower transfers.  Patient's care partner is independent to provide the necessary physical assistance at discharge.   Pt made excellent progress while in IPR. She cont to be most limited by vestibular impairments causing her to feel "rocky". As a result, recommending use of BSC at bedside at night in order to reduce fall risk. Pt's daughter has been present throughout rehab admission and is aware of pt's deficits and recommendations made.    Recommendation:  Patient will benefit from ongoing skilled OT services in home health setting to continue to advance functional skills in the area of BADL and iADL.  Equipment: Tub transfer bench  Reasons for discharge: treatment goals met and discharge from hospital  Patient/family agrees with progress made and goals achieved: Yes  OT Discharge Precautions/Restrictions  Precautions Precautions: Fall Precaution Comments: Vestibular impairments affects balance Restrictions Weight Bearing Restrictions: No Vision Baseline Vision/History: Wears glasses Wears Glasses: Reading only Patient Visual Report: No change from baseline Vision Assessment?: No apparent visual deficits Cognition Overall Cognitive Status: Within Functional Limits for tasks assessed Arousal/Alertness: Awake/alert Orientation Level: Oriented X4 Attention: Sustained Sustained Attention: Appears intact Memory: Appears intact Awareness: Appears intact Problem Solving: Appears intact Safety/Judgment: Appears intact Sensation Sensation Light Touch: Appears  Intact Proprioception: Appears Intact Additional Comments: Reports "tightness" feeling in L UE Coordination Gross Motor Movements are Fluid and Coordinated: Yes Fine Motor Movements are Fluid and Coordinated: Yes (Slightly decreased speed with L finger opposition though able to complete through full range) Finger Nose Finger Test: Decreased speed and accuracy L UE; WFL R UE 9 Hole Peg Test: R: 34.47 and 38.46   L: 47.29 and 47.55 Motor  Motor Motor: Within Functional Limits Trunk/Postural Assessment  Cervical Assessment Cervical Assessment: Exceptions to [facility] (Forward head; rounded shoulders) Thoracic Assessment Thoracic Assessment: Exceptions to [facility] (Kyphotic) Lumbar Assessment Lumbar Assessment: Exceptions to [facility] (Posterior pelvic tilt) Postural Control Postural Control: Deficits on evaluation  Balance Balance Balance Assessed: Yes Static Sitting Balance Static Sitting - Balance Support: Feet supported Static Sitting - Level of Assistance: 6: Modified independent (Device/Increase time) Dynamic Sitting Balance Dynamic Sitting - Balance Support: During functional activity;Feet supported Dynamic Sitting - Level of Assistance: 6: Modified independent (Device/Increase time) Sitting balance - Comments: Sitting to complete bathing task Static Standing Balance Static Standing - Balance Support: During functional activity Static Standing - Level of Assistance: 6: Modified independent (Device/Increase time) Static Standing - Comment/# of Minutes: Standing to complete bathig/dressing tasks Dynamic Standing Balance Dynamic Standing - Balance Support: During functional activity Dynamic Standing - Level of Assistance: 6: Modified independent (Device/Increase time) Dynamic Standing - Comments: Standing to complete LB bathing/dressing tasks Extremity/Trunk Assessment RUE Assessment RUE Assessment: Within Functional Limits LUE Assessment LUE Assessment: Within Functional Limits LUE AROM  (degrees) Overall AROM Left Upper Extremity: Within functional limits for tasks assessed LUE Strength LUE Overall Strength: Deficits LUE Overall Strength Comments: 4/5 throughout; slightly diminished gross grasp strength   See Function Navigator for Current Functional Status.  Lewis, Amy C 03/21/2017, 9:05 AM

## 2017-03-21 NOTE — Progress Notes (Signed)
PHYSICAL MEDICINE & REHABILITATION     PROGRESS NOTE    Subjective/Complaints:  Upset stomach after breakfast no abd pain no vomiting no bowel issues Reviewed meds with RN  ROS: pt denies nausea, vomiting, diarrhea, cough, shortness of breath or chest pain   Objective: Vital Signs: Blood pressure (!) 145/68, pulse 61, temperature 98.5 F (36.9 C), temperature source Oral, resp. rate 17, height 5\' 6"  (1.676 m), weight 79.7 kg (175 lb 11.3 oz), SpO2 99 %. No results found. No results for input(s): WBC, HGB, HCT, PLT in the last 72 hours. No results for input(s): NA, K, CL, GLUCOSE, BUN, CREATININE, CALCIUM in the last 72 hours.  Invalid input(s): CO CBG (last 3)  No results for input(s): GLUCAP in the last 72 hours.  Wt Readings from Last 3 Encounters:  03/19/17 79.7 kg (175 lb 11.3 oz)  03/05/17 72.3 kg (159 lb 4.8 oz)    Physical Exam:  Constitutional: She is oriented to person, place, and time. She appears well-developed and well-nourished. No distress.  HENT:  Head: Normocephalic and atraumatic.  Mouth/Throat: Oropharynx is clear and moist.  Eyes: Pupils are equal, round, and reactive to light. Conjunctivae and EOM are normal.  Neck: Normal range of motion. Neck supple.  Cardiovascular: RRR without murmur. No JVD  .   Respiratory:CTA  GI: Soft. Bowel sounds are normal. She exhibits no distension. There is no tenderness.  Musculoskeletal: She exhibits edema (min edema bilateral feet). She exhibits no tenderness.   Neurological: She is alert and oriented to person, place, and time. A cranial nerve deficit is present.  Speech clear. Delays in processing.  Able to follow basic commands without difficulty.  Motor: 5/5 grossly throughout RUE: Dysmetria is mild. LZJ:[id number] Dysmetria and ataxia noted but able to hold fork and feed self.  Skin: Skin is warm and dry. She is not diaphoretic.  Psychiatric: pleaasant  Assessment/Plan: 1. Balance deficits/gait  disorder secondary to left cerebellar infarct which require 3+ hours per day of interdisciplinary therapy in a comprehensive inpatient rehab setting. Physiatrist is providing close team supervision and 24 hour management of active medical problems listed below. Physiatrist and rehab team continue to assess barriers to discharge/monitor patient progress toward functional and medical goals.  Function:  Bathing Bathing position   Position: Shower  Bathing parts Body parts bathed by patient: Right arm, Left arm, Chest, Abdomen, Front perineal area, Buttocks, Right upper leg, Left upper leg, Right lower leg, Left lower leg Body parts bathed by helper: Back  Bathing assist Assist Level: Supervision or verbal cues      Upper Body Dressing/Undressing Upper body dressing   What is the patient wearing?: Bra, Pull over shirt/dress Bra - Perfomed by patient: Thread/unthread right bra strap, Thread/unthread left bra strap, Hook/unhook bra (pull down sports bra) Bra - Perfomed by helper: Hook/unhook bra (pull down sports bra) Pull over shirt/dress - Perfomed by patient: Thread/unthread right sleeve, Thread/unthread left sleeve, Put head through opening, Pull shirt over trunk   Button up shirt - Perfomed by patient: Thread/unthread right sleeve, Thread/unthread left sleeve, Pull shirt around back, Button/unbutton shirt Button up shirt - Perfomed by helper: Pull shirt around back, Button/unbutton shirt    Upper body assist Assist Level: More than reasonable time   Set up : To obtain clothing/put away  Lower Body Dressing/Undressing Lower body dressing   What is the patient wearing?: Pants, Non-skid slipper socks     Pants- Performed by patient: Thread/unthread right pants leg, Thread/unthread left  pants leg, Pull pants up/down   Non-skid slipper socks- Performed by patient: Don/doff right sock, Don/doff left sock Non-skid slipper socks- Performed by helper: Don/doff right sock, Don/doff left  sock Socks - Performed by patient: Don/doff right sock, Don/doff left sock   Shoes - Performed by patient: Don/doff right shoe, Don/doff left shoe, Fasten right, Fasten left         TED Hose - Performed by helper: Don/doff right TED hose, Don/doff left TED hose  Lower body assist Assist for lower body dressing: More than reasonable time      Toileting Toileting Toileting activity did not occur: No continent bowel/bladder event Toileting steps completed by patient: Adjust clothing prior to toileting, Performs perineal hygiene, Adjust clothing after toileting Toileting steps completed by helper: Adjust clothing prior to toileting, Adjust clothing after toileting Toileting Assistive Devices: Grab bar or rail  Toileting assist Assist level: More than reasonable time   Transfers Chair/bed transfer   Chair/bed transfer method: Ambulatory Chair/bed transfer assist level: Supervision or verbal cues Chair/bed transfer assistive device: Medical sales representative     Max distance: 176ft Assist level: Supervision or verbal cues   Wheelchair   Type: Manual Max wheelchair distance: 160 Assist Level: Dependent (Pt equals 0%)  Cognition Comprehension Comprehension assist level: Follows complex conversation/direction with extra time/assistive device  Expression Expression assist level: Expresses complex ideas: With no assist  Social Interaction Social Interaction assist level: Interacts appropriately with others - No medications needed.  Problem Solving Problem solving assist level: Solves complex 90% of the time/cues < 10% of the time  Memory Memory assist level: Recognizes or recalls 90% of the time/requires cueing < 10% of the time   Medical Problem List and Plan: 1.  Balance deficits, gait abnormality, limitations in self-care secondary to left cerebellar infarct with hemorrhagic conversion.  -Cont CIR PT, OT, SLP, Progressing well towards goals 2.  DVT  Prophylaxis/Anticoagulation: Pharmaceutical: Lovenox, plt normal 8/21 3. Pain Management: tylenol prn 4. Mood: LCSW to follow for evaluation and support.   5. Neuropsych: This patient is capable of making decisions on her own behalf. 6. Skin/Wound Care: routine pressure relief measures, heels look normal 7. Fluids/Electrolytes/Nutrition: Monitor I/O.  Marland Kitchen Push fluids to avoid orthostatic symptoms  -I personally reviewed the patient's labs today.    -seems to have reasonable appetite 8. WUX:LKGMWNU BP bid. Continue HCTZ, labetalol and Norvasc.   -improved control at present/ permissible HTN Vitals:   03/20/17 2120 03/21/17 0455  BP: (!) 145/69 (!) 145/68  Pulse: 73 61  Resp:  17  Temp:  98.5 F (36.9 C)  SpO2:  99%   9. Colitis? with melena: abd exam neg nausea may be cerebellar CVA related  -hgb 11.9 on 03/18/2017 10. Hypokalemia: Resolved with supplement. K+ 4.0 8/14  11. Dizziness:  vestibular eval ordered  -will need acclimation .    12. Constipation: sorbitol today, fleet enema prn,    LOS (Days) [place] EVALUATION WAS PERFORMED  Charlett Blake, MD 03/21/2017 8:37 AM

## 2017-03-21 NOTE — Progress Notes (Signed)
Occupational Therapy Session Note  Patient Details  Name: Ashley Chan MRN: [id number] Date of Birth: [birth year]  Today's Date: 03/21/2017 OT Individual Time: 5784-6962 OT Individual Time Calculation (min): 75 min    Short Term Goals: Week 2:  OT Short Term Goal 1 (Week 2): STG=LTG due to LOS  Skilled Therapeutic Interventions/Progress Updates:    Pt seen for OT ADL bathing/dressing session. Pt in supine upon arrival, agreeable to tx session. She required increased time prepping to stand up this morning due to complaints of dizziness/ "rocking" which she states is always worse in the A.M. Discussed use of BSC at bedside at night to reduce risk of falls when getting up at night, pt voiced understanding and agreement with recommendation, CSW made aware for ordering of equipment.  Following rest seated EOB, pt ambulated within room to complete toileting task and gather clothing in prep for shower with distant supervision. She ambulated throughout unit to ADL apartment requiring one seated rest break. Completed showering task utilizing tub/shower combo and tub transfer bench in simulation of home environment. Transfer completed with distant supervision. She bathed sit <> stand to complete buttock hygiene/ pericare care with use of grab bars for steadying assist. She transitioned out to w/c to complete dressing task mod I, requiring rest breaks throughout.  In therapy gym, completed 9 hole peg test, see results below. Pt taken back to room total A in w/c. Upon returning to room pt with complaints of nausea with following emesis. RN made aware and pt left sitting up in w/c with all needs in reach, daughter present and RN entering.   9 Hole Peg Test:  L; 47.29 and 47.55 R: 34.47 and 38.46  Therapy Documentation Precautions:  Precautions Precautions: Fall Restrictions Weight Bearing Restrictions: No Pain:   Denies pain, however, complaints of dizziness/ "rockiness". Rest breaks provided  throughout session.   See Function Navigator for Current Functional Status.   Therapy/Group: Individual Therapy  Lewis, Earline Stiner C 03/21/2017, 6:59 AM

## 2017-03-21 NOTE — Progress Notes (Signed)
Physical Therapy Discharge Summary  Patient Details  Name: Ashley Chan MRN: [id number] Date of Birth: [birth year]  Today's Date: 03/21/2017 PT Individual Time: 1300-1400 PT Individual Time Calculation (min): 60 min    Patient has met 8 of 8 long term goals due to improved activity tolerance, improved balance, improved postural control, increased strength, increased range of motion, decreased pain and improved coordination.  Patient to discharge at an ambulatory level Supervision.   Patient's care partner is independent to provide the necessary physical assistance at discharge.  Reasons goals not met: all tx goals met.   Recommendation:  Patient will benefit from ongoing skilled PT services in home health setting to continue to advance safe functional mobility, address ongoing impairments in balance, coordination, endurance, gait, and minimize fall risk.  Equipment: Transport WC and RW   Reasons for discharge: treatment goals met and discharge from hospital  Patient/family agrees with progress made and goals achieved: Yes   PT Treatment.  PT instructed pt in grad day assessment to measure progress toward rehab goals. See below for details. Pt reports increased nausea at end of session and returned to bed without cues or assist from PT.  PT Discharge   Pain   0/10  Vision/Perception     Central Vertigo.  Cognition Overall Cognitive Status: Within Functional Limits for tasks assessed Arousal/Alertness: Awake/alert Orientation Level: Oriented X4 Attention: Sustained Sustained Attention: Appears intact Memory: Appears intact Awareness: Appears intact Problem Solving: Appears intact Safety/Judgment: Appears intact Sensation Sensation Light Touch: Appears Intact Proprioception: Appears Intact Additional Comments: Reports "tightness" feeling in L UE Coordination Gross Motor Movements are Fluid and Coordinated: No Fine Motor Movements are Fluid and Coordinated: Yes  (Slightly decreased speed with L finger opposition though able to complete through full range) Coordination and Movement Description: mild ataxia in LUE and LLE Finger Nose Finger Test: Decreased speed and accuracy L UE; WFL R UE Motor  Motor Motor: Ataxia Motor - Discharge Observations: significantly improve ataxia from eval   Mobility Bed Mobility Bed Mobility: Rolling Right;Rolling Left;Sit to Supine;Supine to Sit Rolling Right: 6: Modified independent (Device/Increase time) Rolling Left: 6: Modified independent (Device/Increase time) Supine to Sit: 6: Modified independent (Device/Increase time) Sit to Supine: 6: Modified independent (Device/Increase time) Transfers Transfers: Yes Sit to Stand: 6: Modified independent (Device/Increase time) Stand Pivot Transfers: 6: Modified independent (Device/Increase time) Locomotion  Ambulation Ambulation: Yes Ambulation/Gait Assistance: 5: Supervision Ambulation Distance (Feet): 150 Feet Assistive device: Rolling walker Ambulation/Gait Assistance Details: Verbal cues for gait pattern Gait Gait: Yes Gait Pattern: Impaired Gait Pattern: Ataxic Stairs / Additional Locomotion Stairs: Yes Stairs Assistance: 5: Supervision Stair Management Technique: Two rails Number of Stairs: 12 Height of Stairs: 6 Wheelchair Mobility Wheelchair Mobility: Yes Wheelchair Assistance: 6: Modified independent (Device/Increase time) Environmental health practitioner: Both upper extremities Distance: 119f   Trunk/Postural Assessment  Cervical Assessment C[facility]ical Assessment: [facility](Forward head; rounded shoulders) Thoracic Assessment Thoracic Assessment: Exceptions to [facility](Kyphotic) Lumbar Assessment Lumbar Assessment: [facility] to [facility](Posterior pelvic tilt) Postural Control Postural Control: Deficits on evaluation  Balance Balance Balance Assessed: Yes Static Sitting Balance Static Sitting - Balance Support: Feet supported Static Sitting - Level  of Assistance: 6: Modified independent (Device/Increase time) Dynamic Sitting Balance Dynamic Sitting - Balance Support: During functional activity;Feet supported Dynamic Sitting - Level of Assistance: 6: Modified independent (Device/Increase time) Sitting balance - Comments: Sitting to complete bathing task Static Standing Balance Static Standing - Balance Support: During functional activity Static Standing - Level of Assistance: 6:  Modified independent (Device/Increase time) Dynamic Standing Balance Dynamic Standing - Balance Support: During functional activity Dynamic Standing - Level of Assistance: 6: Modified independent (Device/Increase time) Extremity Assessment      RLE Assessment RLE Assessment: Exceptions to WFL (Grossly 4/5) LLE Assessment LLE Assessment: Exceptions to [facility] (Grossly 4/5)   See Function Navigator for Current Functional Status.  [name] 03/21/2017, 1:56 PM

## 2017-03-21 NOTE — Progress Notes (Addendum)
Occupational Therapy Session Note  Patient Details  Name: SHAJUAN MUSSO MRN: [id number] Date of Birth: 06-26-1929  Today's Date: 03/21/2017 OT Individual Time: 1430-1525 OT Individual Time Calculation (min): 55 min    Short Term Goals: Week 2:  OT Short Term Goal 1 (Week 2): STG=LTG due to LOS  Skilled Therapeutic Interventions/Progress Updates:    1:1. No pain reported. Focus of session on [place] of LUE, endurance, and vestibular HEP. Pt toilets withand washes hands at sink with MOD I. Pt ambulates with RW to dayroom with supervision to improve endurance to sit at table top to complete fine motor activities such as dealing playing cards, playing card game "slap-jack", sorting coins and placing coins into bank to improve palm<>finger translocation of LUE. Pt ambulates back to room as stated above and completes vestibular HEP seated EOB/standing with supervision. Exited session with pt semi reclined in bed with call light in reach.  Therapy Documentation Precautions:  Precautions Precautions: Fall Precaution Comments: Vestibular impairments affects balance Restrictions Weight Bearing Restrictions: No  See Function Navigator for Current Functional Status.   Therapy/Group: Individual Therapy  Tonny Branch 03/21/2017, 4:26 PM

## 2017-03-21 NOTE — Discharge Instructions (Signed)
Inpatient Rehab Discharge Instructions  Ashley Chan Discharge date and time: No discharge date for patient encounter.   Activities/Precautions/ Functional Status: Activity: activity as tolerated Diet: regular diet Wound Care: none needed Functional status:  ___ No restrictions     ___ Walk up steps independently ___ 24/7 supervision/assistance   ___ Walk up steps with assistance ___ Intermittent supervision/assistance  ___ Bathe/dress independently ___ Walk with walker     _x STROKE/TIA DISCHARGE INSTRUCTIONS SMOKING Cigarette smoking nearly doubles your risk of having a stroke & is the single most alterable risk factor  If you smoke or have smoked in the last 12 months, you are advised to quit smoking for your health.  Most of the excess cardiovascular risk related to smoking disappears within a year of stopping.  Ask you doctor about anti-smoking medications  [place] Quit Line: 1-800-QUIT NOW  Free Smoking Cessation Classes [phone]  CHOLESTEROL Know your levels; limit fat & cholesterol in your diet  Lipid Panel     Component Value Date/Time   CHOL 216 (H) 03/04/2017 0329   TRIG 969 (H) 03/04/2017 0329   HDL 26 (L) 03/04/2017 0329   CHOLHDL 8.3 03/04/2017 0329   VLDL UNABLE TO CALCULATE IF TRIGLYCERIDE OVER 400 mg/dL 03/04/2017 0329   LDLCALC UNABLE TO CALCULATE IF TRIGLYCERIDE OVER 400 mg/dL 03/04/2017 0329      Many patients benefit from treatment even if their cholesterol is at goal.  Goal: Total Cholesterol (CHOL) less than 160  Goal:  Triglycerides (TRIG) less than 150  Goal:  HDL greater than 40  Goal:  LDL (LDLCALC) less than 100   BLOOD PRESSURE American Stroke Association blood pressure target is less that 120/80 mm/Hg  Your discharge blood pressure is:  BP: (!) 153/67  Monitor your blood pressure  Limit your salt and alcohol intake  Many individuals will require more than one medication for high blood pressure  DIABETES (A1c is a blood sugar  average for last 3 months) Goal HGBA1c is under 7% (HBGA1c is blood sugar average for last 3 months)  Diabetes: No known diagnosis of diabetes    Lab Results  Component Value Date   HGBA1C 5.4 03/04/2017     Your HGBA1c can be lowered with medications, healthy diet, and exercise.  Check your blood sugar as directed by your physician  Call your physician if you experience unexplained or low blood sugars.  PHYSICAL ACTIVITY/REHABILITATION Goal is 30 minutes at least 4 days per week  Activity: Increase activity slowly, Therapies: Physical Therapy: Home Health Return to work:   Activity decreases your risk of heart attack and stroke and makes your heart stronger.  It helps control your weight and blood pressure; helps you relax and can improve your mood.  Participate in a regular exercise program.  Talk with your doctor about the best form of exercise for you (dancing, walking, swimming, cycling).  DIET/WEIGHT Goal is to maintain a healthy weight  Your discharge diet is: Diet Heart Room service appropriate? Yes; Fluid consistency: Thin  liquids Your height is:  Height: 5\' 6"  (167.6 cm) Your current weight is: Weight: 80.1 kg (176 lb 8 oz) Your Body Mass Index (BMI) is:  BMI (Calculated): 28.2  Following the type of diet specifically designed for you will help prevent another stroke.  Your goal weight range is:    Your goal Body Mass Index (BMI) is 19-24.  Healthy food habits can help reduce 3 risk factors for stroke:  High cholesterol, hypertension, and  excess weight.  RESOURCES Stroke/Support Group:  Call [phone]   STROKE EDUCATION PROVIDED/REVIEWED AND GIVEN TO PATIENT Stroke warning signs and symptoms How to activate emergency medical system (call 911). Medications prescribed at discharge. Need for follow-up after discharge. Personal risk factors for stroke. Pneumonia vaccine given:  Flu vaccine given:  My questions have been answered, the writing is legible, and I  understand these instructions.  I will adhere to these goals & educational materials that have been provided to me after my discharge from the hospital.   __ Bathe/dress with assistance ___ Walk Independently    ___ Shower independently ___ Walk with assistance    ___ Shower with assistance ___ No alcohol     ___ Return to work/school ________  Special Instructions:    COMMUNITY REFERRALS UPON DISCHARGE:    Home Health:   PT & OT   Agency:[facility]  DVVOH:[phone]   Date of last service:03/22/2017    Medical Equipment/Items Ordered:TRANSPORT CHAIR, Vassie Moselle, [place]   [phone]   My questions have been answered and I understand these instructions. I will adhere to these goals and the provided educational materials after my discharge from the hospital.  Patient/Caregiver Signature _______________________________ Date __________  Clinician Signature _______________________________________ Date __________  Please bring this form and your medication list with you to all your follow-up doctor's appointments.

## 2017-03-22 DIAGNOSIS — I69393 Ataxia following cerebral infarction: Secondary | ICD-10-CM

## 2017-03-22 NOTE — Progress Notes (Signed)
Medications gone over with patient and daughter. Belongings packed. Escorted to car by NT.

## 2017-03-22 NOTE — Progress Notes (Signed)
Patient is eager to go home.  O: BP (!) 148/52   Pulse 64   Temp 98.2 F (36.8 C) (Oral)   Resp 18   Ht 5\' 6"  (1.676 m)   Wt 175 lb 11.3 oz (79.7 kg)   SpO2 99%   BMI 28.36 kg/m   Medical Problem List and Plan: 1. Balance deficits, gait abnormality, limitations in self-care secondary to left cerebellar infarct with hemorrhagic conversion.             -ok for dc 2. DVT Prophylaxis/Anticoagulation: Pharmaceutical: Lovenox, plt normal 8/21 3. Pain Management: tylenol prn 4. Mood: LCSW to follow for evaluation and support.   5. Neuropsych: This patient is capable of making decisions on her own behalf. 6. Skin/Wound Care: routine pressure relief measures,  7. Fluids/Electrolytes/Nutrition: Monitor I/O.  Marland Kitchenno concerns 8. QMV:HQIONGE BP bid. Continue HCTZ, labetalol and Norvasc.adequate control              -improved control at present/ permissible HTN     Vitals:   03/19/17 1417 03/20/17 0426  BP: 129/67 (!) 142/72  Pulse: 72 83  Resp: 16 18  Temp: 98.1 F (36.7 C) 98.8 F (37.1 C)  SpO2: 98% 99%   9. Colitis? Seems to have improved 10. Hypokalemia: Resolved   11. Dizziness:  vestibular eval ordered             -will need acclimation .   12. Constipation: resolved

## 2017-03-24 ENCOUNTER — Telehealth: Payer: Self-pay | Admitting: Physical Medicine and Rehabilitation

## 2017-03-24 NOTE — Telephone Encounter (Signed)
Chart reviewed post discharge to complete documentation. Called patient/daugher and advised them to start enteric coated ASA 325 mg daily--no abdominal pain or rectal bleeding reported. Advised to drink plenty of fluids, avoid NDAIDs and to avoid constipation--to use miralax or senna to prevent recurrent rectal bleeding.

## 2017-03-24 NOTE — Discharge Summary (Signed)
Physician Discharge Summary  Patient ID: Ashley Chan MRN: [id number] DOB/AGE: [birth year] 83 y.o.  Admit date: 03/10/2017 Discharge date: 03/22/2017  Discharge Diagnoses:  Principal Problem:   Cerebellar stroke, acute ([place]) Active Problems:   Ataxia, post-stroke   Duodenitis   Neurologic gait disorder   Dizziness and giddiness   Spinal stenosis in cervical region   Discharged Condition: stable   Significant Diagnostic Studies: Ct Head Wo Contrast  Result Date: 03/10/2017 CLINICAL DATA:  Left cerebellar infarct EXAM: CT HEAD WITHOUT CONTRAST TECHNIQUE: Contiguous axial images were obtained from the base of the skull through the vertex without intravenous contrast. COMPARISON:  Several recent head CT examinations, most recent March 05, 2017; brain MRI March 03, 2017 FINDINGS: Brain: Mild generalized atrophy is stable. Recent left cerebellar infarct is again noted with decreased attenuation in this area. The previously noted hematoma in this area is no longer well-defined with less acute appearing hemorrhage and less well-defined hemorrhage in this area compared to most recent CT examination. There is more diffuse hemorrhage with cytotoxic edema in this area; there does not appear to be progression of the infarct itself, however. The edema abuts but does not efface the fourth ventricle ; fourth ventricle remains in the midline and appears unchanged compared to most recent studies. Quadrigeminal plate cisterns do not appear effaced and appears stable. Elsewhere, there is no mass or hemorrhage. No midline shift or extra-axial fluid collection. There is small vessel disease throughout the centra semiovale bilaterally, stable. No new infarct is demonstrable on this study. Vascular: There is no appreciable no hyperdense vessel. There is calcification in each carotid siphon region. Skull: Bony calvarium appears intact. There is pannus posterior to the odontoid. No impression on the craniocervical  junction. Sinuses/Orbits: There is mucosal thickening in several ethmoid air cells bilaterally. Other visualized paranasal sinuses are clear. Visualized orbits appear symmetric bilaterally. Other: Mastoid air cells are clear. IMPRESSION: 1. The left cerebellar infarct is again noted. There has been partial dissolution of hematoma within this acute infarct with more diffuse and vague appearing hemorrhage compared to recent study. Well-defined hematoma is no longer evident. The overall extent of this left cerebellar infarct is stable. There is cytotoxic edema in this area which abuts but does not efface the fourth ventricle. There is no effacement of quadrigeminal plate cisterns. Fourth ventricle is midline. 2. No new hemorrhage or new infarct evident. There is periventricular small vessel disease throughout the centra semiovale bilaterally, stable. There is no well-defined mass, midline shift, or extra-axial fluid collection. 3. There is calcification in each carotid siphon region. There is mucosal thickening in several ethmoid air cells. Electronically Signed   By: Lowella Grip III M.D.   On: 03/10/2017 13:35    US Thyroid  Result Date: 03/09/2017 CLINICAL DATA:  Incidental on CT. EXAM: THYROID ULTRASOUND TECHNIQUE: Ultrasound examination of the thyroid gland and adjacent soft tissues was performed. COMPARISON:  None. FINDINGS: Parenchymal Echotexture: Normal Isthmus: 0.5 cm Right lobe: 3.7 x 2.0 x 1.4 cm Left lobe: 3.5 x 1.5 x 1.4 cm _________________________________________________________ Estimated total number of nodules >/= 1 cm: 2 Number of spongiform nodules >/=  2 cm not described below (TR1): 0 Number of mixed cystic and solid nodules >/= 1.5 cm not described below (TR2): 0 _________________________________________________________ Nodule # 1: Location: Right; Mid Maximum size: 1.2 cm; Other 2 dimensions: 0.5 x 0.6 cm Composition: solid/almost completely solid (2) Echogenicity: hypoechoic (2)  Shape: not taller-than-wide (0) Margins: smooth (0) Echogenic foci: none (0) ACR  TI-RADS total points: 4. ACR TI-RADS risk category: TR4 (4-6 points). ACR TI-RADS recommendations: *Given size (>/= 1 - 1.4 cm) and appearance, a follow-up ultrasound in 1 year should be considered based on TI-RADS criteria. _________________________________________________________ Nodule # 2: Location: Left; Mid Maximum size: 1.3 cm; Other 2 dimensions: 0.9 x 0.9 cm Composition: solid/almost completely solid (2) Echogenicity: hypoechoic (2) Shape: not taller-than-wide (0) Margins: Echogenic foci: none (0) ACR TI-RADS total points: 4. ACR TI-RADS risk category: TR4 (4-6 points). ACR TI-RADS recommendations: *Given size (>/= 1 - 1.4 cm) and appearance, a follow-up ultrasound in 1 year should be considered based on TI-RADS criteria. _________________________________________________________ Multiple other smaller nodules are scattered throughout both lobes which do not meet criteria for biopsy. IMPRESSION: Bilateral nodules 1 and 2 meet criteria for annual follow-up. The above is in keeping with the ACR TI-RADS recommendations - J Am Coll Radiol 2017;14:587-595. Electronically Signed   By: Marybelle Killings M.D.   On: 03/09/2017 08:09    Labs:  Basic Metabolic Panel: BMP Latest Ref Rng & Units 03/18/2017 03/11/2017 03/09/2017  Glucose 65 - 99 mg/dL 131(H) 122(H) 137(H)  BUN 6 - 20 mg/dL 17 15 16   Creatinine 0.44 - 1.00 mg/dL 0.89 0.86 0.89  Sodium 135 - 145 mmol/L 139 140 138  Potassium 3.5 - 5.1 mmol/L 3.8 4.0 3.9  Chloride 101 - 111 mmol/L 102 105 100(L)  CO2 22 - 32 mmol/L 29 26 27   Calcium 8.9 - 10.3 mg/dL 9.7 9.2 9.3    CBC: CBC Latest Ref Rng & Units 03/18/2017 03/11/2017 03/09/2017  WBC 4.0 - 10.5 K/uL 5.9 7.0 8.7  Hemoglobin 12.0 - 15.0 g/dL 11.9(L) 11.8(L) 11.9(L)  Hematocrit 36.0 - 46.0 % 35.7(L) 34.7(L) 35.1(L)  Platelets 150 - 400 K/uL 392 331 265    CBG: No results for input(s): GLUCAP in the last 168  hours.  Brief HPI:    Ashley Tyer Carteris a 83 y.o.left handed female who was admitted to OSH with reports of dizziness, malaise and coffec ground emesis.  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 She was transferred to [facility] for treatment and care. She was started medications for tighter BP control and required fluid boluses due to poor UOP. She did have bradycardic episode question due to vasovagal event as well as heme positive stools and coffee ground emesis. Dr. Carlean Purl consulted for input as patient with history of gastric ulcers. EGD showed duodenitis and colonoscopy revealed ten mm ulcer in sigmoid colon with erythema and edema. GI recommended maintaining adequate hydration, avoiding NSAIDs x 2 weeks and suspected that ischemic colitis led to ulcer and heme positive stools. Follow up CT head showed improved in hemorrhage. No antithrombotic due to hemorrhage per Dr. Erlinda Hong. s. Therapy ongoing and patient with limitation in mobility and ability to carry out ADL tasks. CIR recommended by MD and rehab team for follow up therapy.     Hospital Course: ELIRA COLASANTI was admitted to rehab 03/10/2017 for inpatient therapies to consist of PT and OT at least three hours five days a week. Past admission physiatrist, therapy team and rehab RN have worked together to provide customized collaborative inpatient rehab. Blood pressures were monitored on bid basis and have shown improvement. H/H has been monitored and has been stable. No episodes of recurrent melena or hematemesis during this stay. Check of lytes showed hypokalemia which has resolved with addition of supplement. She continues to have nausea  especially in am related to cerebellar stroke. Vestibular evaluation done revealing moderate dizziness with positional changes and vomiting with right Dix-Hallpike maneuvers.  She has been educated on habituation exercises to  help manage symptoms.  She is continent of bowel and bladder. Her length of stay was shortened per patient/family request. She has progressed to supervision level and will continue to receive follow up HHPT and [place] [facility] after discharge.    Rehab course: During patient's stay in rehab weekly team conferences were held to monitor patient's progress, set goals and discuss barriers to discharge. At admission, patient required mod assist with basic self care tasks and mobility. She exhibited mild higher level cognitive deficits that were at baseline per patient and family therefore no ST needed during this stay.  She has had improvement in activity tolerance, balance, postural control, as well as ability to compensate for deficits. She is able to complete ADL tasks at modified independent level and needs supervision for shower transfers.  She is modified independent for transfers and is ambulating 150' with RW and supervision.    Disposition: 01-Home or Self Care  Diet: Regular.   Special Instructions: 1. Needs 24 hours supervision. 2. Needs repeat thyroid ultrasound in one year.  Discharge Instructions    Ambulatory referral to Physical Medicine Rehab    Complete by:  As directed    Moderate complexity follow-up on a 2 weeks left cerebellar infarct     Allergies as of 03/22/2017      Reactions   Lidocaine    Reaction (?)   Propranolol    Reaction (?)   Sulfa Antibiotics    Reaction (?)      Medication List    STOP taking these medications   aspirin EC 81 MG tablet   diclofenac 75 MG EC tablet Commonly known as:  VOLTAREN     TAKE these medications   amLODipine 10 MG tablet Commonly known as:  NORVASC Take 1 tablet (10 mg total) by mouth daily. What changed:  medication strength  how much to take   atorvastatin 40 MG tablet Commonly known as:  LIPITOR Take 1 tablet (40 mg total) by mouth daily at 6 PM.   clonazePAM 0.5 MG tablet Commonly known as:   KLONOPIN Take 1 tablet (0.5 mg total) by mouth daily as needed for anxiety.   diclofenac sodium 1 % Gel Commonly known as:  VOLTAREN Apply 1 g topically 2 (two) times daily as needed (for pain or inflammation). TO AFFECTED AREAS   fluticasone 50 MCG/ACT nasal spray Commonly known as:  FLONASE Place 2 sprays into both nostrils daily as needed for allergies or rhinitis.   hydrochlorothiazide 25 MG tablet Commonly known as:  HYDRODIURIL Take 1 tablet (25 mg total) by mouth daily. What changed:  medication strength  how much to take   labetalol 100 MG tablet Commonly known as:  NORMODYNE Take 1 tablet (100 mg total) by mouth 2 (two) times daily. What changed:  medication strength  how much to take   pantoprazole 40 MG tablet Commonly known as:  PROTONIX Take 1 tablet (40 mg total) by mouth daily.   potassium chloride 10 MEQ tablet Commonly known as:  K-DUR Take 1 tablet (10 mEq total) by mouth daily.   PROAIR HFA 108 (90 Base) MCG/ACT inhaler Generic drug:  albuterol Inhale 1 puff into the lungs every 6 (six) hours as needed for shortness of breath or wheezing.   traMADol 50 MG tablet Commonly  known as:  ULTRAM Take 1 tablet (50 mg total) by mouth 2 (two) times daily as needed. What changed:  reasons to take this   Vitamin D (Ergocalciferol) 50000 units Caps capsule Commonly known as:  DRISDOL Take 50,000 Units by mouth every 7 (seven) days.         Follow-up Information    Monico Blitz, MD Follow up on 03/26/2017.   Specialty:  Internal Medicine Why:  Appointment @ 3:45 PM Contact information: [place] [address] [phone]        Charlett Blake, MD Follow up.   Specialty:  Physical Medicine and Rehabilitation Why:  Office to call for appointment Contact information: [place] [address] [phone]        Rosalin Hawking, MD Follow up.   Specialty:  Neurology Why:  Call for appointment 1 month Contact  information: [place] [phone]        [name], MD Follow up.   Specialty:  Gastroenterology Why:  Call for appointment Contact information: [address] [place] [address] [phone]           Signed: Bary Leriche 03/24/2017, 5:09 PM

## 2017-03-26 ENCOUNTER — Telehealth: Payer: Self-pay | Admitting: *Deleted

## 2017-03-26 NOTE — Telephone Encounter (Signed)
Marinus Maw PT from [facility]  called to get verbal orders for 2wk7 1wk2.  Aproval given

## 2017-04-07 ENCOUNTER — Other Ambulatory Visit: Payer: Self-pay

## 2017-04-07 DIAGNOSIS — I10 Essential (primary) hypertension: Secondary | ICD-10-CM | POA: Diagnosis not present

## 2017-04-07 DIAGNOSIS — R270 Ataxia, unspecified: Secondary | ICD-10-CM | POA: Diagnosis not present

## 2017-04-07 DIAGNOSIS — G8929 Other chronic pain: Secondary | ICD-10-CM | POA: Diagnosis not present

## 2017-04-07 DIAGNOSIS — I69398 Other sequelae of cerebral infarction: Secondary | ICD-10-CM | POA: Diagnosis not present

## 2017-04-07 DIAGNOSIS — M545 Low back pain: Secondary | ICD-10-CM | POA: Diagnosis not present

## 2017-04-07 DIAGNOSIS — R42 Dizziness and giddiness: Secondary | ICD-10-CM | POA: Diagnosis not present

## 2017-04-07 DIAGNOSIS — F419 Anxiety disorder, unspecified: Secondary | ICD-10-CM | POA: Diagnosis not present

## 2017-04-07 NOTE — Patient Outreach (Signed)
[facility]) Care Management  04/07/2017  HELMA ARGYLE [birth year] [id number]     EMMI-GENERAL DISCHARGE RED ON EMMI ALERT Day # 13 Date: 04/06/17 Red Alert Reason: " Went to follow up appt? No"    Outreach attempt #1 to patient. No answer. RN CM left HIPAA compliant voicemail message along with contact info.        Plan: RN CM will make outreach attempt to patient within one business if no return call from patient.   Enzo Montgomery, RN,BSN,CCM [place] [facility] Telephonic Care Management Coordinator Direct Phone: [phone] Toll Free: [phone] Fax: [phone]

## 2017-04-08 ENCOUNTER — Other Ambulatory Visit: Payer: Self-pay

## 2017-04-08 NOTE — Patient Outreach (Signed)
[facility]) Care Management  04/08/2017  Ashley Chan 1928/09/02 [id number]   EMMI-GENERAL DISCHARGE RED ON EMMI ALERT Day # 13 Date: 04/06/17 Red Alert Reason: " Went to follow up appt? No"   Outreach attempt #2 to patient. Spoke with patient who voices she is doing fine since discharge from the hospital. RN CM reviewed and addressed red alert with patient. Patient confirmed that she goes to see PCP on this week 04/10/17. She is not sure if she will keep or reschedule appt due to impending bad weather. Reviewed with patient her other f/u appts as well. She did confirm that she has transportation to get to appts. She voices she has all her med and denies any questions or concerns regarding them. She voices no further RN CM needs or concerns at this time. Advised patient that she has completed post discharge EMMI-Stroke automated calls. Patient voiced understanding and was appreciative of f/u call.    Plan: RN CM will notify [facility] administrative assistant of case status.   Enzo Montgomery, RN,BSN,CCM [place] [facility] Telephonic Care Management Coordinator Direct Phone: [phone] Toll Free: [phone] Fax: [phone]

## 2017-04-10 ENCOUNTER — Inpatient Hospital Stay: Payer: No Typology Code available for payment source | Admitting: Physical Medicine & Rehabilitation

## 2017-04-28 ENCOUNTER — Encounter (INDEPENDENT_AMBULATORY_CARE_PROVIDER_SITE_OTHER): Payer: Self-pay

## 2017-04-28 ENCOUNTER — Encounter: Payer: Self-pay | Admitting: Physical Medicine & Rehabilitation

## 2017-04-28 ENCOUNTER — Encounter: Payer: Medicare Other | Attending: Physical Medicine & Rehabilitation

## 2017-04-28 ENCOUNTER — Ambulatory Visit (HOSPITAL_BASED_OUTPATIENT_CLINIC_OR_DEPARTMENT_OTHER): Payer: 59 | Admitting: Physical Medicine & Rehabilitation

## 2017-04-28 VITALS — BP 126/79 | HR 75

## 2017-04-28 DIAGNOSIS — I69393 Ataxia following cerebral infarction: Secondary | ICD-10-CM

## 2017-04-28 DIAGNOSIS — I639 Cerebral infarction, unspecified: Secondary | ICD-10-CM

## 2017-04-28 DIAGNOSIS — Z87891 Personal history of nicotine dependence: Secondary | ICD-10-CM | POA: Diagnosis not present

## 2017-04-28 DIAGNOSIS — F419 Anxiety disorder, unspecified: Secondary | ICD-10-CM | POA: Diagnosis not present

## 2017-04-28 DIAGNOSIS — I10 Essential (primary) hypertension: Secondary | ICD-10-CM | POA: Insufficient documentation

## 2017-04-28 DIAGNOSIS — R260 Ataxic gait: Secondary | ICD-10-CM | POA: Diagnosis not present

## 2017-04-28 NOTE — Patient Instructions (Signed)
Please let me know if you would like a referral for outpatient therapy at [facility] This would be PT and OT  Graduated return to driving instructions were provided. It is recommended that the patient first drives with another licensed driver in an empty parking lot. If the patient does well with this, and they can drive on a quiet street with the licensed driver. If the patient does well with this they can drive on a busy street with a licensed driver. If the patient does well with this, the next time out they can go by himself.  I recommend no nighttime or Interstate driving.

## 2017-04-28 NOTE — Progress Notes (Signed)
Subjective:    Patient ID: Ashley Chan, female    DOB: [birth year], 83 y.o.   MRN: [id number] Ashley Chan was admitted to rehab 03/10/2017 for inpatient therapies to consist of PT and OT at least three hours five days a week. Past admission physiatrist, therapy team and rehab RN have worked together to provide customized collaborative inpatient rehab. Blood pressures were monitored on bid basis and have shown improvement. H/H has been monitored and has been stable. No episodes of recurrent melena or hematemesis during this stay. Check of lytes showed hypokalemia which has resolved with addition of supplement. She continues to have nausea especially in am related to cerebellar stroke. Vestibular evaluation done revealing moderate dizziness with positional changes and vomiting with right Dix-Hallpike maneuvers.  She has been educated on habituation exercises to help manage symptoms Admit date: 03/10/2017 Discharge date: 03/22/2017  HPI  Pt is left handed , c/o problem with writing  [facility] visit  Using straight cane, but walker first thing in am Mod I dressing and bathing Can make a sandwich Daughters make food for her No falls Some constipation, states this is chronic No fecal or urinary incontinence Drinks liquids ok   Patient would like to return to driving  Pain Inventory Average Pain 0 Pain Right Now 0 My pain is na  In the last 24 hours, has pain interfered with the following? General activity 0 Relation with others 0 Enjoyment of life 0 What TIME of day is your pain at its worst? na Sleep (in general) Fair  Pain is worse with: na Pain improves with: na Relief from Meds: na  Mobility use a cane use a walker ability to climb steps?  yes do you drive?  no  Function retired  Neuro/Psych tremor trouble walking  Prior Studies Any changes since last visit?  no  Physicians involved in your care Any changes since last visit?  no   Family  History  Problem Relation Age of Onset  . Stroke Mother 64  . Lung disease Father        "black lung"   Social History   Social History  . Marital status: Widowed    Spouse name: N/A  . Number of children: 59  . Years of education: N/A   Occupational History  . retired    Social History Main Topics  . Smoking status: Former Smoker    Types: Cigarettes    Start date: 1949    Quit date: 1963  . Smokeless tobacco: Never Used  . Alcohol use No  . Drug use: No  . Sexual activity: Not on file   Other Topics Concern  . 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 BACK SURGERY  2011  . CATARACT EXTRACTION, BILATERAL  1999  . COLONOSCOPY WITH PROPOFOL N/A 03/10/2017   Procedure: COLONOSCOPY WITH PROPOFOL;  Surgeon: Ladene Artist, MD;  Location: [facility] ENDOSCOPY;  Service: Endoscopy;  Laterality: N/A;  . ESOPHAGOGASTRODUODENOSCOPY N/A 03/09/2017   Procedure: ESOPHAGOGASTRODUODENOSCOPY (EGD);  Surgeon: [name], MD;  Location: [facility] ENDOSCOPY;  Service: Endoscopy;  Laterality: N/A;   Past Medical History:  Diagnosis Date  . Anxiety   . Ataxia, post-stroke   . Back pain   . Chronic midline low back  pain without sciatica   . CVA (cerebral vascular accident) ([place]) 03/03/2017  . Hypertension   . Right hip pain    There were no vitals taken for this visit.  Opioid Risk Score:   Fall Risk Score:  `1  Depression screen PHQ 2/9  No flowsheet data found.   Review of Systems  Constitutional: Positive for diaphoresis.  HENT: Negative.   Eyes: Negative.   Respiratory: Negative.   Cardiovascular: Negative.   Gastrointestinal: Negative.   Endocrine: Negative.   Genitourinary: Negative.   Musculoskeletal: Negative.   Skin: Negative.   Allergic/Immunologic:  Negative.   Neurological: Negative.   Hematological: Negative.   Psychiatric/Behavioral: Negative.   All other systems reviewed and are negative.      Objective:   Physical Exam  Constitutional: She is oriented to person, place, and time. She appears well-developed and well-nourished. No distress.  HENT:  Head: Normocephalic and atraumatic.  Eyes: Pupils are equal, round, and reactive to light. Conjunctivae and EOM are normal.  Neck: Normal range of motion. Neck supple. No JVD present.  Cardiovascular: Normal rate, regular rhythm and normal heart sounds.  Exam reveals no friction rub.   No murmur heard. Pulmonary/Chest: Effort normal and breath sounds normal. No stridor. No respiratory distress. She has no wheezes.  Abdominal: Soft. Bowel sounds are normal. She exhibits no distension. There is no tenderness.  Neurological: She is alert and oriented to person, place, and time. She has normal strength. She displays no atrophy. No sensory deficit. She exhibits normal muscle tone. Coordination and gait abnormal.  Visual fields are intact confrontation testing No evidence nystagmus Extraocular muscles are intact  Motor strength is 5/5 bilateral deltoid, biceps, triceps, grip, hip flexor, knee extensor, ankle dorsal flexor plantar flexor. Ambulates without assistive device. She has no evidence to drag or knee instability. She has a slightly widened base of support. Unable to do toe walking  Mild dysmetria, left finger-nose-finger left heel to shin  Skin: She is not diaphoretic.  Psychiatric: She has a normal mood and affect.  Nursing note and vitals reviewed.         Assessment & Plan:  1. 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 Overall, doing very, very well, is basically at a modified independent level. She was driving prior to her stroke and at this point, I do not see why she could not return at least in a limited fashion. According to the  daughter, patient only drove locally Graduated return to driving instructions were provided. It is recommended that the patient first drives with another licensed driver in an empty parking lot. If the patient does well with this, and they can drive on a quiet street with the licensed driver. If the patient does well with this they can drive on a busy street with a licensed driver. If the patient does well with this, the next time out they can go by himself. I recommend no nighttime or Interstate driving.   We'll finish out. Home health therapy. Will reassess, may require some outpatient therapy to maximize functional improvement. Return to clinic in 6 weeks  Patient has appointment for neurology follow-up, evaluate secondary stroke prophylaxis

## 2017-05-19 ENCOUNTER — Ambulatory Visit (INDEPENDENT_AMBULATORY_CARE_PROVIDER_SITE_OTHER): Payer: PRIVATE HEALTH INSURANCE | Admitting: Neurology

## 2017-05-19 ENCOUNTER — Encounter: Payer: Self-pay | Admitting: Neurology

## 2017-05-19 VITALS — BP 128/71 | HR 77 | Ht 66.0 in | Wt 179.0 lb

## 2017-05-19 DIAGNOSIS — I639 Cerebral infarction, unspecified: Secondary | ICD-10-CM | POA: Diagnosis not present

## 2017-05-19 MED ORDER — ASPIRIN EC 325 MG PO TBEC: 325.0000 mg | DELAYED_RELEASE_TABLET | Freq: Every day | ORAL | 0 refills | Status: DC

## 2017-05-19 NOTE — Patient Instructions (Signed)
I had a long d/w patient about his recent stroke, risk for recurrent stroke/TIAs, personally independently reviewed imaging studies and stroke evaluation results and answered questions.Continue aspirin 325 mg daily  for secondary stroke prevention and maintain strict control of hypertension with blood pressure goal below 130/90, diabetes with hemoglobin A1c goal below 6.5% and lipids with LDL cholesterol goal below 70 mg/dL. I also advised the patient to eat a healthy diet with plenty of whole grains, cereals, fruits and vegetables, exercise regularly and maintain ideal body weight ibuprofen advised her to use a cane at all times to avoid falls.Followup in the future with my nurse practitioner in 6 months or call earlier if necessary.

## 2017-05-19 NOTE — Progress Notes (Signed)
[facility] [address] [place]. [place] [address] [phone]       OFFICE FOLLOW-UP NOTE  Ms. AMBERLEE GARVEY Date of Birth:  1929-07-12 Medical Record Number:  [id number]   HPI: 83 year lady seen today for first offce follow up visit after [facility] admission in August 2018 for stroke.history the patient on the patient, daughter and review of electronic medical records. I have also personally reviewed imaging films.Romesha G Carteris an 83 y.o.femalewith history of hypertension who presented to [facility] emergency room complaining of not feeling well. Apparently patient woke up in the morning not feeling well and then started noticing dizziness followed by nausea vomiting and some coffee-ground emesis. Initial head CT was negative. Unfortunately they were unable to get MRI secondary to no MRIs being done at [facility] over the weekend. 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 For this reason patient was transferred to [facility] for further evaluation and care. Date last known well: Date: 03/02/2017.Time last known well: Unable to determine tPA Given:No: Hemorrhagic transformation, no last known normal.Modified Rankin: Rankin Score=0    patient had mild hemiataxia on exam but nausea and dizziness improved. CT scan showed left superior cerebellar hematoma with mild cytotoxic edema. MRI was done at an outside hospital and showed hemorrhagic left superior cerebellar infarct. CT angiogram of the head and neck showed mild bilateral proximal atherosclerotic changes with moderate left paraclinoid ICA stenosis. Carotid Doppler showed no significant extracranial stenosis. transthoraxic echo showed normal ejection fraction. LDL could not be calculated as triglycerides were elevated at 909 mg percent the total cholesterol of 216. Hemoglobin  A1c was 5.4. Patient was continued on aspirin and transferred to inpatient rehabilitation where she made steady recovery. She is currently at home and finishing home physical and occupation therapy. She is able to walk with a cane. She still has mild imbalance but she is doing well and has had no falls or injuries. She plans to see Dr. Joylene Igo from rehabilitation in a few weeks. She is tolerating aspirin well without bruising or bleeding. Patient was supposed to get a 30 day external heart monitor after discharge but this has not yet happened. She states her blood pressure is under good control and today it is 128/71. She is tolerating Lipitor well without muscle aches and pains.  ROS:   14 system review of systems is positive for joint pain and swelling, back pain, aching muscles, muscle cramps, walking difficulty, tremors and all other systems negative  PMH:  Past Medical History:  Diagnosis Date  . Anxiety   . Ataxia, post-stroke   . Back pain   . Chronic midline low back pain without sciatica   . CVA (cerebral vascular accident) ([place]) 03/03/2017  . Hypertension   . Right hip pain     Social History:  Social History   Social History  . Marital status: Widowed    Spouse name: N/A  . Number of children: 63  . Years of education: N/A   Occupational History  . retired    Social History Main Topics  . Smoking status: Former Smoker    Types: Cigarettes    Start date: 1949    Quit date: 1963  . Smokeless tobacco: Never Used  . Alcohol use No  . Drug use: No  . Sexual activity: Not on file   Other Topics Concern  .  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 amLODipine (NORVASC) 10 MG tablet Take 1 tablet (10  mg total) by mouth daily. 30 tablet 0  . atorvastatin (LIPITOR) 40 MG tablet Take 1 tablet (40 mg total) by mouth daily at 6 PM. 30 tablet 0  . clonazePAM (KLONOPIN) 0.5 MG tablet Take 1 tablet (0.5 mg total) by mouth daily as needed for anxiety. 20 tablet 0  . diclofenac sodium (VOLTAREN) 1 % GEL Apply 1 g topically 2 (two) times daily as needed (for pain or inflammation). TO AFFECTED AREAS  2  . fluticasone (FLONASE) 50 MCG/ACT nasal spray Place 2 sprays into both nostrils daily as needed for allergies or rhinitis.   3  . hydrochlorothiazide (HYDRODIURIL) 25 MG tablet Take 1 tablet (25 mg total) by mouth daily. 30 tablet 0  . labetalol (NORMODYNE) 100 MG tablet Take 1 tablet (100 mg total) by mouth 2 (two) times daily. 60 tablet 0  . pantoprazole (PROTONIX) 40 MG tablet Take 1 tablet (40 mg total) by mouth daily. 30 tablet 0  . potassium chloride (K-DUR) 10 MEQ tablet Take 1 tablet (10 mEq total) by mouth daily. 30 tablet 0  . PROAIR HFA 108 (90 Base) MCG/ACT inhaler Inhale 1 puff into the lungs every 6 (six) hours as needed for shortness of breath or wheezing.  3  . traMADol (ULTRAM) 50 MG tablet Take 1 tablet (50 mg total) by mouth 2 (two) times daily as needed. 30 tablet 0  . Vitamin D, Ergocalciferol, (DRISDOL) 50000 units CAPS capsule Take 50,000 Units by mouth every 7 (seven) days.  6   No current facility-administered medications on file prior to visit.     Allergies:   Allergies  Allergen Reactions  . Lidocaine     Reaction (?)  . Propranolol     Reaction (?)  . Sulfa Antibiotics     Reaction (?)    Physical Exam General:Frail elderly lady seated, in no evident distress Head: head normocephalic and atraumatic.  Neck: supple with no carotid or supraclavicular bruits Cardiovascular: regular rate and rhythm, no murmurs Musculoskeletal: no deformity Skin:  no rash/petichiae Vascular:  Normal pulses all extremities Vitals:   05/19/17 1422  BP: 128/71  Pulse: 77    Neurologic Exam Mental Status: Awake and fully alert. Oriented to place and time. Recent and remote memory intact. Attention span, concentration and fund of knowledge appropriate. Mood and affect appropriate.  Cranial Nerves: Fundoscopic exam reveals sharp disc margins. Pupils equal, briskly reactive to light. Extraocular movements full without nystagmus. Visual fields full to confrontation. Hearing intact. Facial sensation intact. Face, tongue, palate moves normally and symmetrically.  Motor: Normal bulk and tone. Normal strength in all tested extremity muscles. Sensory.: intact to touch ,pinprick .position and vibratory sensation.  Coordination: Rapid alternating movements normal in all extremities. Finger-to-nose and heel-to-shin performed accurately bilaterally. Gait and Station: Arises from chair with mild difficulty. Stance is slightly broad-based. Mildly ataxic gait. Uses a cane. Unable to walk tandem. Reflexes: 1+ and symmetric. Toes downgoing.   NIHSS  0 Modified Rankin  2   ASSESSMENT: 83 year old  [place] with 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 She is doing well  with only minor balance difficulties. Vascular risk factors of hypertension, hyperlipidemia and age    PLAN: I had a long d/w patient about his recent stroke, risk for recurrent stroke/TIAs, personally independently reviewed imaging studies and stroke evaluation results and answered questions.Continue aspirin 325 mg daily  for secondary stroke prevention and maintain strict control of hypertension with blood pressure goal below 130/90, diabetes with hemoglobin A1c goal below 6.5% and lipids with LDL cholesterol goal below 70 mg/dL. I also advised the patient to eat a healthy diet with plenty of whole grains, cereals, fruits and vegetables, exercise regularly and maintain ideal body weight . I advised  to get a 30 day external heart monitor to look for paroxysmal atrial  fibrillation and also  to use a cane at all times to avoid falls.Followup in the future with my nurse practitioner in 6 months or call earlier if necessary. Greater than 50% of time during this 25 minute visit was spent on counseling,explanation of diagnosis of embolic stroke, planning of further management, discussion with patient and family and coordination of care Antony Contras, MD Medical Director [place] Pager: [phone] 05/19/2017 3:20 PM  Note: This document was prepared with digital dictation and possible smart phrase technology. Any transcriptional errors that result from this process are unintentional

## 2017-06-12 ENCOUNTER — Ambulatory Visit: Payer: Medicare Other | Admitting: Physical Medicine & Rehabilitation

## 2017-06-17 ENCOUNTER — Other Ambulatory Visit: Payer: Self-pay | Admitting: Neurology

## 2017-08-07 ENCOUNTER — Encounter: Payer: Self-pay | Admitting: Physical Medicine & Rehabilitation

## 2017-08-07 ENCOUNTER — Ambulatory Visit (HOSPITAL_BASED_OUTPATIENT_CLINIC_OR_DEPARTMENT_OTHER): Payer: PRIVATE HEALTH INSURANCE | Admitting: Physical Medicine & Rehabilitation

## 2017-08-07 ENCOUNTER — Other Ambulatory Visit: Payer: Self-pay

## 2017-08-07 ENCOUNTER — Encounter: Payer: Medicare Other | Attending: Physical Medicine & Rehabilitation

## 2017-08-07 VITALS — BP 133/73 | HR 75

## 2017-08-07 DIAGNOSIS — I639 Cerebral infarction, unspecified: Secondary | ICD-10-CM | POA: Insufficient documentation

## 2017-08-07 DIAGNOSIS — M170 Bilateral primary osteoarthritis of knee: Secondary | ICD-10-CM | POA: Diagnosis not present

## 2017-08-07 DIAGNOSIS — Z87891 Personal history of nicotine dependence: Secondary | ICD-10-CM | POA: Insufficient documentation

## 2017-08-07 DIAGNOSIS — R260 Ataxic gait: Secondary | ICD-10-CM | POA: Diagnosis not present

## 2017-08-07 DIAGNOSIS — I69393 Ataxia following cerebral infarction: Secondary | ICD-10-CM | POA: Diagnosis not present

## 2017-08-07 DIAGNOSIS — I10 Essential (primary) hypertension: Secondary | ICD-10-CM | POA: Diagnosis not present

## 2017-08-07 DIAGNOSIS — F419 Anxiety disorder, unspecified: Secondary | ICD-10-CM | POA: Diagnosis not present

## 2017-08-07 DIAGNOSIS — R269 Unspecified abnormalities of gait and mobility: Secondary | ICD-10-CM

## 2017-08-07 NOTE — Patient Instructions (Signed)
Please call [facility] hospital PT dept to schedule appt

## 2017-08-07 NOTE — Progress Notes (Signed)
Subjective:    Patient ID: Ashley Chan, female    DOB: Oct 26, 1928, 84 y.o.   MRN: [id number] Left cerebellar infarct Aug 2018, Admit date: 03/10/2017 Discharge date:03/22/2017 HPI  Completed home health therapy.  She is not back to her baseline yet.  Still has some problems with balance and fatigue. She does have transportation to [facility]. Pain Inventory Average Pain 7 Pain Right Now 7 My pain is sharp and burning  In the last 24 hours, has pain interfered with the following? General activity 2 Relation with others 2 Enjoyment of life 0 What TIME of day is your pain at its worst? night Sleep (in general) Good  Pain is worse with: walking and standing Pain improves with: rest, heat/ice and medication Relief from Meds: 7  Mobility walk with assistance use a cane how many minutes can you walk? 10 ability to climb steps?  yes do you drive?  no Do you have any goals in this area?  no  Function retired I need assistance with the following:  household duties and shopping  Neuro/Psych bowel control problems weakness trouble walking  Prior Studies Any changes since last visit?  no  Physicians involved in your care Any changes since last visit?  no   Family History  Problem Relation Age of Onset  . Stroke Mother 32  . Lung disease Father        "black lung"  . Stroke Son    Social History   Socioeconomic History  . Marital status: Widowed    Spouse name: None  . Number of children: 9  . Years of education: None  . Highest education level: None  Social Needs  . Financial resource strain: None  . Food insecurity - worry: None  . Food insecurity - inability: None  . Transportation needs - medical: None  . Transportation needs - non-medical: None  Occupational History  . Occupation: retired  Tobacco Use  . Smoking status: Former Smoker    Types: Cigarettes    Start date: 1949    Last attempt to quit: 1963    Years since quitting:  56.0  . Smokeless tobacco: Never Used  Substance and Sexual Activity  . Alcohol use: No  . Drug use: No  . Sexual activity: None  Other Topics Concern  . 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 BACK SURGERY  2011  . CATARACT EXTRACTION, BILATERAL  1999  . COLONOSCOPY WITH PROPOFOL N/A 03/10/2017   Procedure: COLONOSCOPY WITH PROPOFOL;  Surgeon: Ladene Artist, MD;  Location[facility] ENDOSCOPY;  Service: Endoscopy;  Laterality: N/A;  . ESOPHAGOGASTRODUODENOSCOPY N/A 03/09/2017   Procedure: ESOPHAGOGASTRODUODENOSCOPY (EGD);  Surgeon: [name], MD;  Location: [facility] ENDOSCOPY;  Service: Endoscopy;  Laterality: N/A;   Past Medical History:  Diagnosis Date  . Anxiety   . Ataxia, post-stroke   . Back pain   . Chronic midline low back pain without sciatica   . CVA (cerebral vascular accident) ([place]) 03/03/2017  . Hypertension   . Right hip pain    There were no vitals taken for this visit.  Opioid Risk Score:   Fall Risk Score:  `1  Depression screen PHQ 2/9  Depression screen PHQ 2/9 08/07/2017  Decreased Interest 0  Down, Depressed, Hopeless 0  PHQ -  2 Score 0      Review of Systems  Constitutional: Negative.   HENT: Negative.   Eyes: Negative.   Respiratory: Negative.   Cardiovascular: Negative.   Gastrointestinal: Negative.   Endocrine: Negative.   Genitourinary: Negative.   Musculoskeletal: Negative.   Skin: Negative.   Allergic/Immunologic: Negative.   Neurological: Negative.   Hematological: Negative.   Psychiatric/Behavioral: Negative.        Objective:   Physical Exam  Constitutional: She is oriented to person, place, and time. She appears well-developed and well-nourished. No distress.  HENT:  Head: Normocephalic and atraumatic.  Eyes:  Conjunctivae and EOM are normal. Pupils are equal, round, and reactive to light.  Neck: Normal range of motion.  Musculoskeletal:  No pain with upper extremity or lower extremity range of motion  Neurological: She is alert and oriented to person, place, and time.  Strength is 5/5 bilateral deltoid bicep tricep grip hip flexor knee extensor ankle dorsiflexor Is dysmetria on finger-nose-finger testing in the left upper and heel to shin dysmetria left lower  Skin: She is not diaphoretic.  Psychiatric: She has a normal mood and affect.  Nursing note and vitals reviewed.  Tends to lean towards the left during ambulation. Mild valgus deformity at the knees.  She has good range of motion no evidence of effusion however she does have pain with crepitus during ambulation      Assessment & Plan:  1.  Left cerebellar infarct with left hemiataxia as well as truncal ataxia.  Overall has had a good functional recovery is modified independent with self-care but still has reduced mobility as well as reduced balance.  Do think she would benefit from outpatient therapy.  Requests a clinic closer to home, will make referral to [facility] Physical medicine and rehabilitation follow-up in approximately 2 months 2.  Osteoarthritis bilateral knees PT can address muscle strengthening as well as range of motion

## 2017-09-30 ENCOUNTER — Encounter: Payer: Medicare Other | Attending: Physical Medicine & Rehabilitation

## 2017-09-30 ENCOUNTER — Ambulatory Visit (HOSPITAL_BASED_OUTPATIENT_CLINIC_OR_DEPARTMENT_OTHER): Payer: 59 | Admitting: Physical Medicine & Rehabilitation

## 2017-09-30 ENCOUNTER — Encounter: Payer: Self-pay | Admitting: Physical Medicine & Rehabilitation

## 2017-09-30 VITALS — BP 166/78 | HR 74 | Resp 14

## 2017-09-30 DIAGNOSIS — Z87891 Personal history of nicotine dependence: Secondary | ICD-10-CM | POA: Diagnosis not present

## 2017-09-30 DIAGNOSIS — I639 Cerebral infarction, unspecified: Secondary | ICD-10-CM | POA: Insufficient documentation

## 2017-09-30 DIAGNOSIS — I10 Essential (primary) hypertension: Secondary | ICD-10-CM | POA: Diagnosis not present

## 2017-09-30 DIAGNOSIS — M170 Bilateral primary osteoarthritis of knee: Secondary | ICD-10-CM | POA: Diagnosis not present

## 2017-09-30 DIAGNOSIS — F419 Anxiety disorder, unspecified: Secondary | ICD-10-CM | POA: Diagnosis not present

## 2017-09-30 DIAGNOSIS — I69393 Ataxia following cerebral infarction: Secondary | ICD-10-CM

## 2017-09-30 DIAGNOSIS — R260 Ataxic gait: Secondary | ICD-10-CM | POA: Insufficient documentation

## 2017-09-30 NOTE — Patient Instructions (Addendum)
Amlodipine can cause swelling you may discuss options with your PCP  Remember to use Voltaren gel 4 times a day

## 2017-09-30 NOTE — Progress Notes (Signed)
Subjective:    Patient ID: Ashley Chan, female    DOB: August 21, 1928, 84 y.o.   MRN: [id number] Admit date: 03/10/2017 Discharge date:03/22/2017 HPI Arthritis seems to be increased after starting physical therapy.  She complains of pain in both knees as well as shoulders. Patient remains independent with all her self-care and mobility. No falls Pain Inventory Average Pain 7 Pain Right Now 0 My pain is aching  In the last 24 hours, has pain interfered with the following? General activity 2 Relation with others 0 Enjoyment of life 2 What TIME of day is your pain at its worst? night Sleep (in general) Good  Pain is worse with: walking, bending, standing and some activites Pain improves with: rest Relief from Meds: 0  Mobility walk with assistance use a cane ability to climb steps?  yes do you drive?  no  Function retired Do you have any goals in this area?  no  Neuro/Psych bladder control problems weakness tremor  Prior Studies Any changes since last visit?  no  Physicians involved in your care Any changes since last visit?  no   Family History  Problem Relation Age of Onset  . Stroke Mother 68  . Lung disease Father        "black lung"  . Stroke Son    Social History   Socioeconomic History  . Marital status: Widowed    Spouse name: None  . Number of children: 9  . Years of education: None  . Highest education level: None  Social Needs  . Financial resource strain: None  . Food insecurity - worry: None  . Food insecurity - inability: None  . Transportation needs - medical: None  . Transportation needs - non-medical: None  Occupational History  . Occupation: retired  Tobacco Use  . Smoking status: Former Smoker    Types: Cigarettes    Start date: 1949    Last attempt to quit: 1963    Years since quitting: 56.2  . Smokeless tobacco: Never Used  Substance and Sexual Activity  . Alcohol use: No  . Drug use: No  . Sexual activity: None    Other Topics Concern  . 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 BACK SURGERY  2011  . CATARACT EXTRACTION, BILATERAL  1999  . COLONOSCOPY WITH PROPOFOL N/A 03/10/2017   Procedure: COLONOSCOPY WITH PROPOFOL;  Surgeon: Ladene Artist, MD;  Location: [facility] ENDOSCOPY;  Service: Endoscopy;  Laterality: N/A;  . ESOPHAGOGASTRODUODENOSCOPY N/A 03/09/2017   Procedure: ESOPHAGOGASTRODUODENOSCOPY (EGD);  Surgeon: [name], MD;  Location: [facility] ENDOSCOPY;  Service: Endoscopy;  Laterality: N/A;   Past Medical History:  Diagnosis Date  . Anxiety   . Ataxia, post-stroke   . Back pain   . Chronic midline low back pain without sciatica   . CVA (cerebral vascular accident) ([place]) 03/03/2017  . Hypertension   . Right hip pain    BP (!) 166/78 (BP Location: Right Arm, Patient Position: Sitting, Cuff Size: Normal)   Pulse 74   Resp 14   SpO2 98%   Opioid Risk Score:   Fall Risk Score:  `1  Depression screen PHQ 2/9  Depression screen PHQ 2/9 08/07/2017  Decreased Interest 0  Down, Depressed, Hopeless 0  PHQ - 2 Score 0  Review of Systems  Constitutional: Positive for diaphoresis.  HENT: Negative.   Eyes: Negative.   Respiratory: Negative.   Cardiovascular: Positive for leg swelling.  Gastrointestinal: Positive for constipation.  Endocrine: Negative.   Genitourinary: Positive for urgency.  Musculoskeletal: Positive for arthralgias and back pain.  Skin: Negative.   Allergic/Immunologic: Negative.   Neurological: Positive for tremors and weakness.  Hematological: Negative.   Psychiatric/Behavioral: Negative.        Objective:   Physical Exam  Constitutional: She is oriented to person, place, and time. She appears well-developed and well-nourished. No distress.  HENT:   Head: Normocephalic and atraumatic.  Eyes: Conjunctivae and EOM are normal. Pupils are equal, round, and reactive to light.  Neurological: She is alert and oriented to person, place, and time.  Skin: Skin is warm and dry. She is not diaphoretic.  Psychiatric: She has a normal mood and affect. Her behavior is normal. Judgment and thought content normal.  Nursing note and vitals reviewed.  Motor strength is 5/5 Gait is without evidence of toe drag or knee instability Osteoarthritis changes in the DIPs of both hands. There is mild tenderness palpation along the medial joint line of both knees. There is good range of motion bilateral knees no evidence of knee effusion. She does have pretibial edema left greater than right lower extremity    Assessment & Plan:  1.  History of left cerebellar hemorrhagic infarct still has some residual balance disorder.  She is attending outpatient therapy.  2.  Bilateral knee osteoarthritis she has Voltaren gel but has only been applying 2 or 3 times a day.  We instructed to start 4 times a day.  Pretibial edema appears to be dependent however may be related to her amlodipine.  Instruct the patient to discuss this with her primary care physician.

## 2017-10-02 ENCOUNTER — Ambulatory Visit: Payer: 59 | Admitting: Physical Medicine & Rehabilitation

## 2017-11-04 ENCOUNTER — Telehealth: Payer: Self-pay

## 2017-11-04 NOTE — Telephone Encounter (Signed)
I called pt, offered her a sooner appt with Janett Billow, NP rather than the currently scheduled appt with Hoyle Sauer, NP. Pt declined a sooner appt and would rather keep current appt. I reminded pt of that appt date and time. Pt verbalized understanding.

## 2017-11-19 ENCOUNTER — Ambulatory Visit: Payer: PRIVATE HEALTH INSURANCE | Admitting: Nurse Practitioner

## 2017-11-24 ENCOUNTER — Ambulatory Visit: Payer: 59 | Admitting: Physical Medicine & Rehabilitation

## 2017-11-24 ENCOUNTER — Ambulatory Visit: Payer: 59

## 2017-12-02 NOTE — Progress Notes (Signed)
[facility]  PATIENT: Ashley Chan DOB: February 12, 1929   REASON FOR VISIT: follow up stroke August 2018 HISTORY FROM: Patient and daughter Ashley Chan    HISTORY OF PRESENT ILLNESS:UPDATE 5/8/2019CM [facility] returns for follow-up for stroke event that occurred in August 2018.  She is currently on aspirin for secondary stroke prevention without further stroke or TIA symptoms.  She has minimal bruising and no bleeding she remains on Lipitor without myalgias.  Therapies have concluded and she is doing her home exercise program.  She has not had her 30-day event monitoring because the order was placed incorrectly.  Patient made aware that we will set that up in [place] close to where she lives in [place]. She denies any falls she ambulates with a single-point cane.  Blood pressure in the office today 132/68.  She is hard of hearing.  She returns for reevaluation  10/22/18PS 41 year lady seen today for first offce follow up visit after [facility] admission in August 2018 for stroke.history the patient on the patient, daughter and review of electronic medical records. I have also personally reviewed imaging films.Ashley G Carteris an 84 y.o.femalewith history of hypertension who presented to [facility] emergency room complaining of not feeling well. Apparently patient woke up in the morning not feeling well and then started noticing dizziness followed by nausea vomiting and some coffee-ground emesis. Initial head CT was negative. Unfortunately they were unable to get MRI secondary to no MRIs being done at [facility] over the weekend. 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 For this reason patient was transferred to [facility] for further evaluation and care. Date last known well: Date: 03/02/2017.Time last known well: Unable to determine tPA  Given:No: Hemorrhagic transformation, no last known normal.Modified Rankin: Rankin Score=0    patient had mild hemiataxia on exam but nausea and dizziness improved. CT scan showed left superior cerebellar hematoma with mild cytotoxic edema. MRI was done at an outside hospital and showed hemorrhagic left superior cerebellar infarct. CT angiogram of the head and neck showed mild bilateral proximal atherosclerotic changes with moderate left paraclinoid ICA stenosis. Carotid Doppler showed no significant extracranial stenosis. transthoraxic echo showed normal ejection fraction. LDL could not be calculated as triglycerides were elevated at 909 mg percent the total cholesterol of 216. Hemoglobin A1c was 5.4. Patient was continued on aspirin and transferred to inpatient rehabilitation where she made steady recovery. She is currently at home and finishing home physical and occupation therapy. She is able to walk with a cane. She still has mild imbalance but she is doing well and has had no falls or injuries. She plans to see Dr. Joylene Igo from rehabilitation in a few weeks. She is tolerating aspirin well without bruising or bleeding. Patient was supposed to get a 30 day external heart monitor after discharge but this has not yet happened. She states her blood pressure is under good control and today it is 128/71. She is tolerating Lipitor well without muscle aches and pains.   REVIEW OF SYSTEMS: Full 14 system review of systems performed and notable only for those listed, all others are neg:  Constitutional: neg  Cardiovascular: neg Ear/Nose/Throat: Hearing loss Skin: neg Eyes: Allergies Respiratory: neg Gastroitestinal: Urinary frequency Hematology/Lymphatic: neg  Endocrine: neg Musculoskeletal:neg Allergy/Immunology: neg Neurological: neg Psychiatric: neg Sleep : neg   ALLERGIES: Allergies  Allergen Reactions  . Lidocaine  Reaction (?)  . Propranolol     Reaction (?)  . Sulfa Antibiotics      Reaction (?)    HOME MEDICATIONS: Outpatient Medications Prior to Visit  Medication Sig Dispense Refill  . amLODipine (NORVASC) 10 MG tablet Take 1 tablet (10 mg total) by mouth daily. 30 tablet 0  . aspirin EC 325 MG tablet Take 1 tablet (325 mg total) by mouth daily. 30 tablet 0  . atorvastatin (LIPITOR) 40 MG tablet Take 1 tablet (40 mg total) by mouth daily at 6 PM. 30 tablet 0  . clonazePAM (KLONOPIN) 0.5 MG tablet Take 1 tablet (0.5 mg total) by mouth daily as needed for anxiety. 20 tablet 0  . diclofenac sodium (VOLTAREN) 1 % GEL Apply 1 g topically 2 (two) times daily as needed (for pain or inflammation). TO AFFECTED AREAS  2  . fluticasone (FLONASE) 50 MCG/ACT nasal spray Place 2 sprays into both nostrils daily as needed for allergies or rhinitis.   3  . hydrochlorothiazide (HYDRODIURIL) 25 MG tablet Take 1 tablet (25 mg total) by mouth daily. 30 tablet 0  . labetalol (NORMODYNE) 100 MG tablet Take 1 tablet (100 mg total) by mouth 2 (two) times daily. 60 tablet 0  . pantoprazole (PROTONIX) 40 MG tablet Take 1 tablet (40 mg total) by mouth daily. 30 tablet 0  . potassium chloride (K-DUR) 10 MEQ tablet Take 1 tablet (10 mEq total) by mouth daily. 30 tablet 0  . PROAIR HFA 108 (90 Base) MCG/ACT inhaler Inhale 1 puff into the lungs every 6 (six) hours as needed for shortness of breath or wheezing.  3  . UNABLE TO FIND Omega xl    . Vitamin D, Ergocalciferol, (DRISDOL) 50000 units CAPS capsule Take 50,000 Units by mouth every 7 (seven) days.  6  . hydrochlorothiazide (HYDRODIURIL) 12.5 MG tablet Take 12.5 mg by mouth daily.  3  . traMADol (ULTRAM) 50 MG tablet Take 1 tablet (50 mg total) by mouth 2 (two) times daily as needed. 30 tablet 0   No facility-administered medications prior to visit.     PAST MEDICAL HISTORY: Past Medical History:  Diagnosis Date  . Anxiety   . Ataxia, post-stroke   . Back pain   . Chronic midline low back pain without sciatica   . CVA (cerebral vascular  accident) ([place]) 03/03/2017  . Hypertension   . Right hip pain     PAST SURGICAL HISTORY: Past Surgical History:  Procedure Laterality Date  . BACK SURGERY  2011  . CATARACT EXTRACTION, BILATERAL  1999  . COLONOSCOPY WITH PROPOFOL N/A 03/10/2017   Procedure: COLONOSCOPY WITH PROPOFOL;  Surgeon: Ladene Artist, MD;  Location: [facility] ENDOSCOPY;  Service: Endoscopy;  Laterality: N/A;  . ESOPHAGOGASTRODUODENOSCOPY N/A 03/09/2017   Procedure: ESOPHAGOGASTRODUODENOSCOPY (EGD);  Surgeon: [name], MD;  Location: [facility] ENDOSCOPY;  Service: Endoscopy;  Laterality: N/A;    FAMILY HISTORY: Family History  Problem Relation Age of Onset  . Stroke Mother 37  . Lung disease Father        "black lung"  . Stroke Son     SOCIAL HISTORY: Social History   Socioeconomic History  . Marital status: Widowed    Spouse name: Not on file  . Number of children: 51  . Years of education: Not on file  . Highest education level: Not on file  Occupational History  . Occupation: retired  Scientific laboratory technician  . Financial resource strain: Not on file  . Food insecurity:  Worry: Not on file    Inability: Not on file  . Transportation needs:    Medical: Not on file    Non-medical: Not on file  Tobacco Use  . Smoking status: Former Smoker    Types: Cigarettes    Start date: 1949    Last attempt to quit: 1963    Years since quitting: 56.3  . Smokeless tobacco: Never Used  Substance and Sexual Activity  . Alcohol use: No  . Drug use: No  . Sexual activity: Not on file  Lifestyle  . Physical activity:    Days per week: Not on file    Minutes per session: Not on file  . Stress: Not on file  Relationships  . Social connections:    Talks on phone: Not on file    Gets together: Not on file    Attends religious service: Not on file    Active member of club or organization: Not on file    Attends meetings of clubs or organizations: Not on file    Relationship status: Not on file  . Intimate partner  violence:    Fear of current or ex partner: Not on file    Emotionally abused: Not on file    Physically abused: Not on file    Forced sexual activity: Not on file  Other Topics Concern  . 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  Generalized: Well developed, in no acute distress  Head: normocephalic and atraumatic,. Oropharynx benign  Neck: Supple, no carotid bruits  Cardiac: Regular rate rhythm, no murmur  Musculoskeletal: No deformity   Neurological examination   Mentation: Alert oriented to time, place, history taking. Attention span and concentration appropriate. Recent and remote memory intact.  Follows all commands speech and language fluent.   Cranial nerve II-XII: Pupils were equal round reactive to light extraocular movements were full, visual field were full on confrontational test. Facial sensation and strength were normal. hearing was intact to finger rubbing bilaterally. Uvula tongue midline. head turning and shoulder shrug were normal and symmetric.Tongue protrusion into cheek strength was normal. Motor: normal bulk and tone, full strength in the BUE, BLE, fine finger movements normal, no pronator drift. No focal weakness Sensory: normal and symmetric to light touch, pinprick, and  Vibration, in the upper and lower extremities Coordination: finger-nose-finger, heel-to-shin bilaterally, no dysmetria Reflexes: 1+ upper lower and symmetric, plantar responses were flexor bilaterally. Gait and Station: Rising up from seated position without assistance, wide based stance,  moderate stride,  smooth turning, able to perform tiptoe, and heel walking without  difficulty. Tandem gait is unsteady, ambulates with single-point cane  DIAGNOSTIC DATA (LABS, IMAGING, TESTING) - I reviewed patient records, labs, notes, testing and imaging myself where available.  Lab Results  Component Value Date   WBC 5.9 03/18/2017   HGB 11.9 (L) 03/18/2017   HCT 35.7 (L) 03/18/2017   MCV 93.2 03/18/2017   PLT 392 03/18/2017      Component Value Date/Time   NA 139 03/18/2017 0524   K 3.8 03/18/2017 0524   CL 102 03/18/2017 0524   CO2 29 03/18/2017  0524   GLUCOSE 131 (H) 03/18/2017 0524   BUN 17 03/18/2017 0524   CREATININE 0.89 03/18/2017 0524   CALCIUM 9.7 03/18/2017 0524   PROT 6.1 (L) 03/11/2017 0522   ALBUMIN 3.1 (L) 03/11/2017 0522   AST 24 03/11/2017 0522   ALT 40 03/11/2017 0522   ALKPHOS 60 03/11/2017 0522   BILITOT 0.6 03/11/2017 0522   GFRNONAA 57 (L) 03/18/2017 0524   GFRAA >60 03/18/2017 0524   Lab Results  Component Value Date   CHOL 216 (H) 03/04/2017   HDL 26 (L) 03/04/2017   LDLCALC UNABLE TO CALCULATE IF TRIGLYCERIDE OVER 400 mg/dL 03/04/2017   TRIG 969 (H) 03/04/2017   CHOLHDL 8.3 03/04/2017   Lab Results  Component Value Date   HGBA1C 5.4 03/04/2017   ASSESSMENT AND PLAN 84 year old  [place] with 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 She is doing well with only minor balance difficulties. Vascular risk factors of hypertension, hyperlipidemia and age    PLAN:Stressed the importance of management of risk factors to prevent further stroke Continue aspirin for secondary stroke prevention Maintain strict control of hypertension with blood pressure goal below 130/90, today's reading 132/68 continue antihypertensive medications Control of diabetes with hemoglobin A1c below 6.5 followed by primary care  Cholesterol with LDL cholesterol less than 70, followed by primary care,   continue statin drug Lipitor Exercise by walking, use cane for safe ambulation eat healthy diet with  whole grains,  fresh fruits and vegetables Follow-up with primary care for stroke risk factor modification, maintain blood pressure goal less than 035 systolic, diabetes with D9R below 7, lipids with LDL below 70 Will reschedule cardiac event monitor F/U in 6 months Dennie Bible, [facility], [facility], APRN  [facility] [address] [place], [place] [address] [phone]

## 2017-12-03 ENCOUNTER — Other Ambulatory Visit: Payer: Self-pay

## 2017-12-03 ENCOUNTER — Encounter: Payer: Self-pay | Admitting: Nurse Practitioner

## 2017-12-03 ENCOUNTER — Ambulatory Visit (INDEPENDENT_AMBULATORY_CARE_PROVIDER_SITE_OTHER): Payer: PRIVATE HEALTH INSURANCE | Admitting: Nurse Practitioner

## 2017-12-03 VITALS — BP 132/68 | HR 65 | Ht 66.0 in | Wt 172.8 lb

## 2017-12-03 DIAGNOSIS — I63233 Cerebral infarction due to unspecified occlusion or stenosis of bilateral carotid arteries: Secondary | ICD-10-CM | POA: Diagnosis not present

## 2017-12-03 DIAGNOSIS — E785 Hyperlipidemia, unspecified: Secondary | ICD-10-CM

## 2017-12-03 DIAGNOSIS — I10 Essential (primary) hypertension: Secondary | ICD-10-CM | POA: Diagnosis not present

## 2017-12-03 NOTE — Patient Instructions (Signed)
Stressed the importance of management of risk factors to prevent further stroke Continue aspirin for secondary stroke prevention Maintain strict control of hypertension with blood pressure goal below 130/90, today's reading 132/68 continue antihypertensive medications Control of diabetes with hemoglobin A1c below 6.5 followed by primary care  Cholesterol with LDL cholesterol less than 70, followed by primary care,   continue statin drug Lipitor Exercise by walking, use cane for safe ambulation eat healthy diet with whole grains,  fresh fruits and vegetables Follow-up with primary care for stroke risk factor modification, maintain blood pressure goal less than 599 systolic, diabetes with J5T below 7, lipids with LDL below 70 Will reschedule cardiac event monitor F/U in 6 months

## 2017-12-03 NOTE — Progress Notes (Signed)
I agree with the above plan 

## 2017-12-09 ENCOUNTER — Telehealth: Payer: Self-pay | Admitting: Neurology

## 2017-12-09 NOTE — Telephone Encounter (Signed)
Called daughter and left her a message cardiac monitor is scheduled 12/11/2017 arrive at 1:45 for 2:00 apt [phone].

## 2017-12-16 NOTE — Telephone Encounter (Signed)
Patient's daughter Dorian Pod (on [place]) calling to have patient's cardiologist appointment for a cardiac monitor scheduled in [place] which is closer and if not there [place].

## 2017-12-18 NOTE — Telephone Encounter (Signed)
Sent to [place] Telephone [phone] Attention Violet .

## 2017-12-29 ENCOUNTER — Telehealth: Payer: Self-pay | Admitting: Neurology

## 2017-12-29 ENCOUNTER — Other Ambulatory Visit: Payer: Self-pay | Admitting: Neurology

## 2017-12-29 DIAGNOSIS — R002 Palpitations: Secondary | ICD-10-CM

## 2017-12-29 NOTE — Telephone Encounter (Signed)
Dr. Leonie Man please put in order for cardiac monitor and add heart papillations so patient can get approved for cardiac monitor  .  Thanks Vickii Chafe is calling from [facility] [phone] - fax [phone].

## 2017-12-29 NOTE — Telephone Encounter (Signed)
Ok done

## 2017-12-30 NOTE — Telephone Encounter (Signed)
Thanks Dr. Leonie Man Order faxed to [facility]

## 2018-01-06 ENCOUNTER — Ambulatory Visit: Payer: 59 | Admitting: Physical Medicine & Rehabilitation

## 2018-01-06 NOTE — Telephone Encounter (Signed)
Patient is having her Cardiac Monitor at [facility] health 01/07/2018 at 1:30 pm family aware.

## 2018-01-09 ENCOUNTER — Other Ambulatory Visit: Payer: Self-pay | Admitting: Neurology

## 2018-01-09 DIAGNOSIS — R002 Palpitations: Secondary | ICD-10-CM

## 2018-06-15 NOTE — Progress Notes (Signed)
[facility]  PATIENT: Stephani Police DOB: 1928/11/21   REASON FOR VISIT: follow up stroke August 2018 HISTORY FROM: Patient and daughter Lorriane Shire    HISTORY OF PRESENT ILLNESS:UPDATE 11/19/2019CM Ms. Salle, 84 year old female returns for follow-up with history of stroke in August 2018.  She is currently on aspirin for secondary stroke prevention, she has not had recurrent stroke or TIA symptoms.  She has minimal bruising and no bleeding.  She is also on Lipitor without myalgias.  Blood pressure in the office today 132/64.  She gets little exercise.  She ambulates with a single-point cane she has not had any falls.  She is very hard of hearing.  Cardiac event monitor for 30 days performed from 01/09/18-02/08/18 June showed NSR with first degree  AV block that is borderline with nonspecific ST-T wave changes.  She returns for reevaluation  UPDATE 5/8/2019CM Ms Munford returns for follow-up for stroke event that occurred in August 2018.  She is currently on aspirin for secondary stroke prevention without further stroke or TIA symptoms.  She has minimal bruising and no bleeding she remains on Lipitor without myalgias.  Therapies have concluded and she is doing her home exercise program.  She has not had her 30-day event monitoring because the order was placed incorrectly.  Patient made aware that we will set that up in [place] close to where she lives in [place]. She denies any falls she ambulates with a single-point cane.  Blood pressure in the office today 132/68.  She is hard of hearing.  She returns for reevaluation  10/22/18PS 39 year lady seen today for first offce follow up visit after [facility] admission in August 2018 for stroke.history the patient on the patient, daughter and review of electronic medical records. I have also personally reviewed imaging films.Jamea G Carteris an 84 y.o.femalewith history of hypertension who presented to [facility] emergency room  complaining of not feeling well. Apparently patient woke up in the morning not feeling well and then started noticing dizziness followed by nausea vomiting and some coffee-ground emesis. Initial head CT was negative. Unfortunately they were unable to get MRI secondary to no MRIs being done at [facility] over the weekend. 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 For this reason patient was transferred to [facility] for further evaluation and care. Date last known well: Date: 03/02/2017.Time last known well: Unable to determine tPA Given:No: Hemorrhagic transformation, no last known normal.Modified Rankin: Rankin Score=0    patient had mild hemiataxia on exam but nausea and dizziness improved. CT scan showed left superior cerebellar hematoma with mild cytotoxic edema. MRI was done at an outside hospital and showed hemorrhagic left superior cerebellar infarct. CT angiogram of the head and neck showed mild bilateral proximal atherosclerotic changes with moderate left paraclinoid ICA stenosis. Carotid Doppler showed no significant extracranial stenosis. transthoraxic echo showed normal ejection fraction. LDL could not be calculated as triglycerides were elevated at 909 mg percent the total cholesterol of 216. Hemoglobin A1c was 5.4. Patient was continued on aspirin and transferred to inpatient rehabilitation where she made steady recovery. She is currently at home and finishing home physical and occupation therapy. She is able to walk with a cane. She still has mild imbalance but she is doing well and has had no falls or injuries. She plans to see Dr. Joylene Igo from rehabilitation in a few weeks. She  is tolerating aspirin well without bruising or bleeding. Patient was supposed to get a 30 day external heart monitor after discharge but this has not yet happened. She states her blood  pressure is under good control and today it is 128/71. She is tolerating Lipitor well without muscle aches and pains.   REVIEW OF SYSTEMS: Full 14 system review of systems performed and notable only for those listed, all others are neg:  Constitutional: neg  Cardiovascular: neg Ear/Nose/Throat: Hearing loss Skin: neg Eyes: Allergies Respiratory: neg Gastroitestinal: Urinary frequency Hematology/Lymphatic: neg  Endocrine: neg Musculoskeletal: Walking difficulty Allergy/Immunology: neg Neurological: neg Psychiatric: neg Sleep : neg   ALLERGIES: Allergies  Allergen Reactions  . Lidocaine     Reaction (?)  . Propranolol     Reaction (?)  . Sulfa Antibiotics     Reaction (?)    HOME MEDICATIONS: Outpatient Medications Prior to Visit  Medication Sig Dispense Refill  . pantoprazole (PROTONIX) 40 MG tablet Take 1 tablet (40 mg total) by mouth daily. 30 tablet 0  . amLODipine (NORVASC) 10 MG tablet Take 1 tablet (10 mg total) by mouth daily. 30 tablet 0  . aspirin EC 325 MG tablet Take 1 tablet (325 mg total) by mouth daily. 30 tablet 0  . atorvastatin (LIPITOR) 40 MG tablet Take 1 tablet (40 mg total) by mouth daily at 6 PM. 30 tablet 0  . clonazePAM (KLONOPIN) 0.5 MG tablet Take 1 tablet (0.5 mg total) by mouth daily as needed for anxiety. 20 tablet 0  . diclofenac sodium (VOLTAREN) 1 % GEL Apply 1 g topically 2 (two) times daily as needed (for pain or inflammation). TO AFFECTED AREAS  2  . fluticasone (FLONASE) 50 MCG/ACT nasal spray Place 2 sprays into both nostrils daily as needed for allergies or rhinitis.   3  . hydrochlorothiazide (HYDRODIURIL) 25 MG tablet Take 1 tablet (25 mg total) by mouth daily. 30 tablet 0  . labetalol (NORMODYNE) 100 MG tablet Take 1 tablet (100 mg total) by mouth 2 (two) times daily. 60 tablet 0  . potassium chloride (K-DUR) 10 MEQ tablet Take 1 tablet (10 mEq total) by mouth daily. 30 tablet 0  . PROAIR HFA 108 (90 Base) MCG/ACT inhaler Inhale 1  puff into the lungs every 6 (six) hours as needed for shortness of breath or wheezing.  3  . UNABLE TO FIND Omega xl    . Vitamin D, Ergocalciferol, (DRISDOL) 50000 units CAPS capsule Take 50,000 Units by mouth every 7 (seven) days.  6   No facility-administered medications prior to visit.     PAST MEDICAL HISTORY: Past Medical History:  Diagnosis Date  . Anxiety   . Ataxia, post-stroke   . Back pain   . Chronic midline low back pain without sciatica   . CVA (cerebral vascular accident) ([place]) 03/03/2017  . Hypertension   . Right hip pain     PAST SURGICAL HISTORY: Past Surgical History:  Procedure Laterality Date  . BACK SURGERY  2011  . CATARACT EXTRACTION, BILATERAL  1999  . COLONOSCOPY WITH PROPOFOL N/A 03/10/2017   Procedure: COLONOSCOPY WITH PROPOFOL;  Surgeon: Ladene Artist, MD;  Location: [facility] ENDOSCOPY;  Service: Endoscopy;  Laterality: N/A;  . ESOPHAGOGASTRODUODENOSCOPY N/A 03/09/2017   Procedure: ESOPHAGOGASTRODUODENOSCOPY (EGD);  Surgeon: [name], MD;  Location: [facility] ENDOSCOPY;  Service: Endoscopy;  Laterality: N/A;    FAMILY HISTORY: Family History  Problem Relation Age of Onset  . Stroke Mother 75  . Lung disease Father        "  black lung"  . Stroke Son     SOCIAL HISTORY: Social History   Socioeconomic History  . Marital status: Widowed    Spouse name: Not on file  . Number of children: 73  . Years of education: Not on file  . Highest education level: Not on file  Occupational History  . Occupation: retired  Scientific laboratory technician  . Financial resource strain: Not on file  . Food insecurity:    Worry: Not on file    Inability: Not on file  . Transportation needs:    Medical: Not on file    Non-medical: Not on file  Tobacco Use  . Smoking status: Former Smoker    Types: Cigarettes    Start date: 1949    Last attempt to quit: 1963    Years since quitting: 56.9  . Smokeless tobacco: Never Used  Substance and Sexual Activity  . Alcohol use: No  .  Drug use: No  . Sexual activity: Not on file  Lifestyle  . Physical activity:    Days per week: Not on file    Minutes per session: Not on file  . Stress: Not on file  Relationships  . Social connections:    Talks on phone: Not on file    Gets together: Not on file    Attends religious service: Not on file    Active member of club or organization: Not on file    Attends meetings of clubs or organizations: Not on file    Relationship status: Not on file  . Intimate partner violence:    Fear of current or ex partner: Not on file    Emotionally abused: Not on file    Physically abused: Not on file    Forced sexual activity: Not on file  Other Topics Concern  . Not on file  Social History Narrative   Widow, 9 children + grandchildren, great grandchildren and great-great grandchildren   Prior employment: [employer] fundraiser, furniture making, domestic work   03/09/2017     PHYSICAL EXAM  Vitals:   06/16/18 1011  BP: 132/64  Pulse: 64  Weight: 174 lb (78.9 kg)  Height: 5\' 6"  (1.676 m)   Body mass index is 28.08 kg/m.  Generalized: Well developed, in no acute distress  Head: normocephalic and atraumatic,. Oropharynx benign  Neck: Supple, no carotid bruits  Cardiac: Regular rate rhythm, no murmur  Musculoskeletal: No deformity   Neurological examination   Mentation: Alert oriented to time, place, history taking. Attention span and concentration appropriate. Recent and remote memory intact.  Follows all commands speech and language fluent.   Cranial nerve II-XII: Pupils were equal round reactive to light extraocular movements were full, visual field were full on confrontational test. Facial sensation and strength were normal. hearing was intact to finger rubbing bilaterally. Uvula tongue midline. head turning and shoulder shrug were normal and symmetric.Tongue protrusion into cheek strength was normal. Motor: normal bulk and tone, full strength in the BUE, BLE, Sensory: normal and  symmetric to light touch, pinprick, and  Vibration, in the upper and lower extremities Coordination: finger-nose-finger, heel-to-shin bilaterally, no dysmetria Reflexes: 1+ upper lower and symmetric, plantar responses were flexor bilaterally. Gait and Station: Rising up from seated position without assistance, wide based stance,  moderate stride,  smooth turning, able to perform tiptoe, and heel walking without difficulty. Tandem gait is unsteady, ambulates with single-point cane  DIAGNOSTIC DATA (LABS, IMAGING, TESTING) - I reviewed patient records, labs, notes, testing and imaging myself where  available.  Lab Results  Component Value Date   WBC 5.9 03/18/2017   HGB 11.9 (L) 03/18/2017   HCT 35.7 (L) 03/18/2017   MCV 93.2 03/18/2017   PLT 392 03/18/2017      Component Value Date/Time   NA 139 03/18/2017 0524   K 3.8 03/18/2017 0524   CL 102 03/18/2017 0524   CO2 29 03/18/2017 0524   GLUCOSE 131 (H) 03/18/2017 0524   BUN 17 03/18/2017 0524   CREATININE 0.89 03/18/2017 0524   CALCIUM 9.7 03/18/2017 0524   PROT 6.1 (L) 03/11/2017 0522   ALBUMIN 3.1 (L) 03/11/2017 0522   AST 24 03/11/2017 0522   ALT 40 03/11/2017 0522   ALKPHOS 60 03/11/2017 0522   BILITOT 0.6 03/11/2017 0522   GFRNONAA 57 (L) 03/18/2017 0524   GFRAA >60 03/18/2017 0524   Lab Results  Component Value Date   CHOL 216 (H) 03/04/2017   HDL 26 (L) 03/04/2017   LDLCALC UNABLE TO CALCULATE IF TRIGLYCERIDE OVER 400 mg/dL 03/04/2017   TRIG 969 (H) 03/04/2017   CHOLHDL 8.3 03/04/2017   Lab Results  Component Value Date   HGBA1C 5.4 03/04/2017   ASSESSMENT AND PLAN 84 year old  [place] with 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 She is doing well with only minor balance difficulties. Vascular risk factors of hypertension, hyperlipidemia and age    PLAN:Stressed the importance of management of risk factors to prevent further stroke Continue aspirin for secondary  stroke prevention Maintain strict control of hypertension with blood pressure goal below 130/90, today's reading 132/64 continue antihypertensive medications Control of diabetes with hemoglobin A1c below 6.5 followed by primary care  Cholesterol with LDL cholesterol less than 70, followed by primary care,   continue statin drug Lipitor Exercise by walking, use cane for safe ambulation if unsteady eat healthy diet with whole grains,  fresh fruits and vegetables Follow-up with primary care for stroke risk factor modification, maintain blood pressure goal less than 638 systolic, diabetes with G5X below 7, lipids with LDL below 70 Discharge from stroke clinic Dennie Bible, [facility], [facility], [facility] [address] [place], [place] [address] [phone]

## 2018-06-16 ENCOUNTER — Encounter: Payer: Self-pay | Admitting: Nurse Practitioner

## 2018-06-16 ENCOUNTER — Ambulatory Visit (INDEPENDENT_AMBULATORY_CARE_PROVIDER_SITE_OTHER): Payer: PRIVATE HEALTH INSURANCE | Admitting: Nurse Practitioner

## 2018-06-16 VITALS — BP 132/64 | HR 64 | Ht 66.0 in | Wt 174.0 lb

## 2018-06-16 DIAGNOSIS — Z8673 Personal history of transient ischemic attack (TIA), and cerebral infarction without residual deficits: Secondary | ICD-10-CM | POA: Diagnosis not present

## 2018-06-16 DIAGNOSIS — I10 Essential (primary) hypertension: Secondary | ICD-10-CM | POA: Diagnosis not present

## 2018-06-16 DIAGNOSIS — E785 Hyperlipidemia, unspecified: Secondary | ICD-10-CM

## 2018-06-16 NOTE — Patient Instructions (Addendum)
Stressed the importance of management of risk factors to prevent further stroke Continue aspirin for secondary stroke prevention Maintain strict control of hypertension with blood pressure goal below 130/90, today's reading 132/64 continue antihypertensive medications Control of diabetes with hemoglobin A1c below 6.5 followed by primary care  Cholesterol with LDL cholesterol less than 70, followed by primary care,   continue statin drug Lipitor Exercise by walking, use cane for safe ambulation if unsteady eat healthy diet with whole grains,  fresh fruits and vegetables Follow-up with primary care for stroke risk factor modification, maintain blood pressure goal less than 161 systolic, diabetes with W9U below 7, lipids with LDL below 70 Will obtain  cardiac event monitor results and if stable will discharge from stroke clinic   Stroke Prevention Some health problems and behaviors may make it more likely for you to have a stroke. Below are ways to lessen your risk of having a stroke.  Be active for at least 30 minutes on most or all days.  Do not smoke. Try not to be around others who smoke.  Do not drink too much alcohol. ? Do not have more than 2 drinks a day if you are a man. ? Do not have more than 1 drink a day if you are a woman and are not pregnant.  Eat healthy foods, such as fruits and vegetables. If you were put on a specific diet, follow the diet as told.  Keep your cholesterol levels under control through diet and medicines. Look for foods that are low in saturated fat, trans fat, cholesterol, and are high in fiber.  If you have diabetes, follow all diet plans and take your medicine as told.  Ask your doctor if you need treatment to lower your blood pressure. If you have high blood pressure (hypertension), follow all diet plans and take your medicine as told by your doctor.  If you are 68-39 years old, have your blood pressure checked every 3-5 years. If you are age 47 or  older, have your blood pressure checked every year.  Keep a healthy weight. Eat foods that are low in calories, salt, saturated fat, trans fat, and cholesterol.  Do not take drugs.  Avoid birth control pills, if this applies. Talk to your doctor about the risks of taking birth control pills.  Talk to your doctor if you have sleep problems (sleep apnea).  Take all medicine as told by your doctor. ? You may be told to take aspirin or blood thinner medicine. Take this medicine as told by your doctor. ? Understand your medicine instructions.  Make sure any other conditions you have are being taken care of.  Get help right away if:  You suddenly lose feeling (you feel numb) or have weakness in your face, arm, or leg.  Your face or eyelid hangs down to one side.  You suddenly feel confused.  You have trouble talking (aphasia) or understanding what people are saying.  You suddenly have trouble seeing in one or both eyes.  You suddenly have trouble walking.  You are dizzy.  You lose your balance or your movements are clumsy (uncoordinated).  You suddenly have a very bad headache and you do not know the cause.  You have new chest pain.  Your heart feels like it is fluttering or skipping a beat (irregular heartbeat). Do not wait to see if the symptoms above go away. Get help right away. Call your local emergency services (911 in [place] Do not drive yourself  to the hospital. This information is not intended to replace advice given to you by your health care provider. Make sure you discuss any questions you have with your health care provider. Document Released: 01/14/2012 Document Revised: 12/21/2015 Document Reviewed: 01/15/2013 Elsevier Interactive Patient Education  [employer].

## 2018-06-16 NOTE — Progress Notes (Signed)
I agree with the above plan 

## 2019-03-10 IMAGING — CT CT HEAD W/O CM
4 series · 16 of 47 positions shown, 18 images · non-contrast
Comparison: CT HEAD March 04, 2017

CLINICAL DATA: Stroke, follow-up evaluation.

EXAM:
CT HEAD WITHOUT CONTRAST
TECHNIQUE: Contiguous axial images were obtained from the base of the skull
through the vertex without intravenous contrast.

[Series 3: head without · axial · non-contrast · 0.43mm/px · z∈[+1209,+1329]mm · 7 of 34 slices shown, 9 images]
[im 5/34  brain]
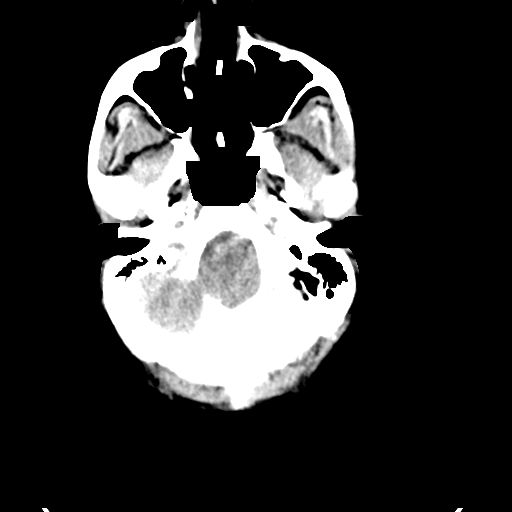
[im 5/34  bone]
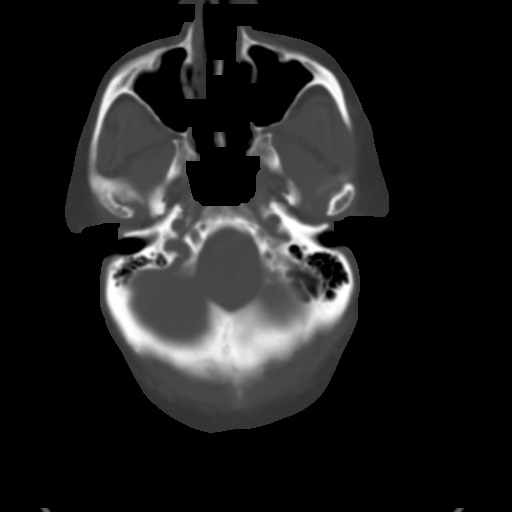
[im 9/34  brain]
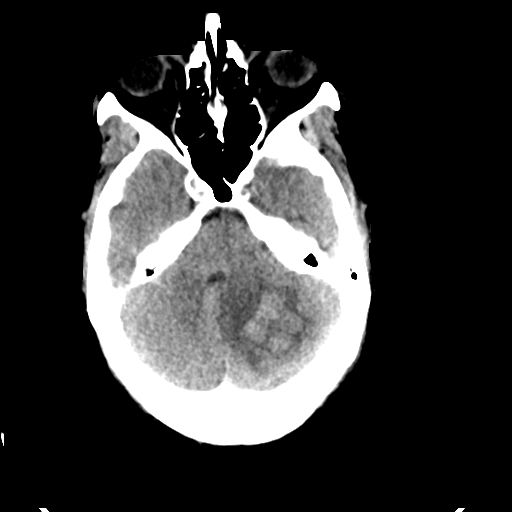
[im 13/34  brain]
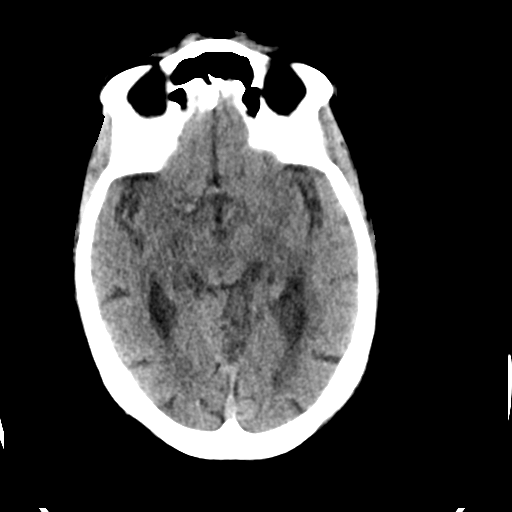
[im 17/34  brain]
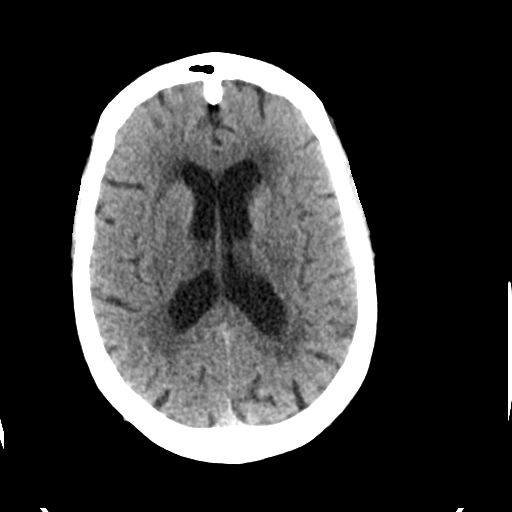
[im 21/34  brain]
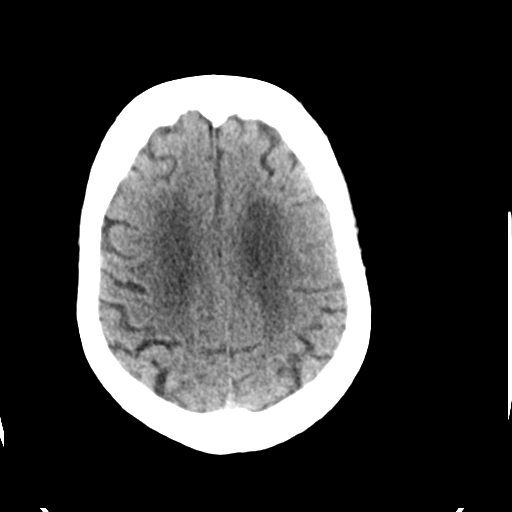
[im 21/34  bone]
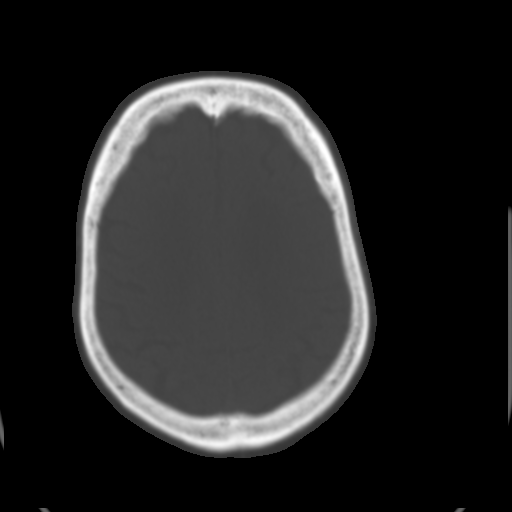
[im 25/34  brain]
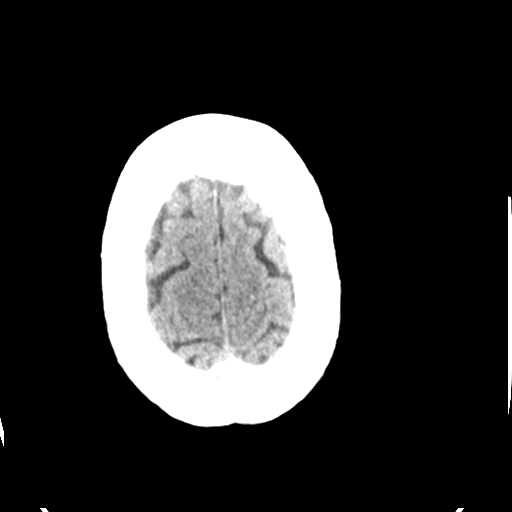
[im 29/34  brain]
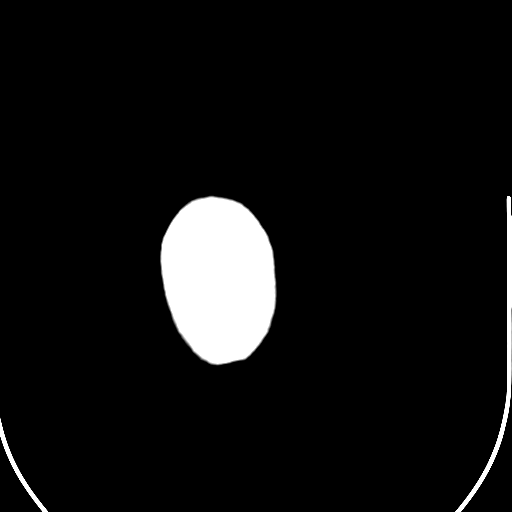

[Series 4: head bone · axial · 0.43mm/px · z∈[+1205,+1237]mm · 3 of 83 slices shown]
[im 9/83  bone]
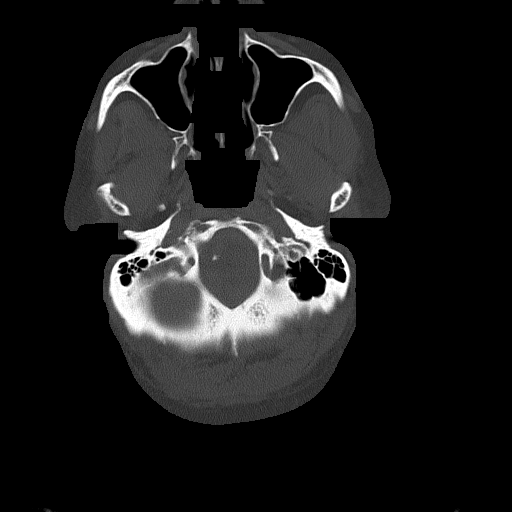
[im 17/83  bone]
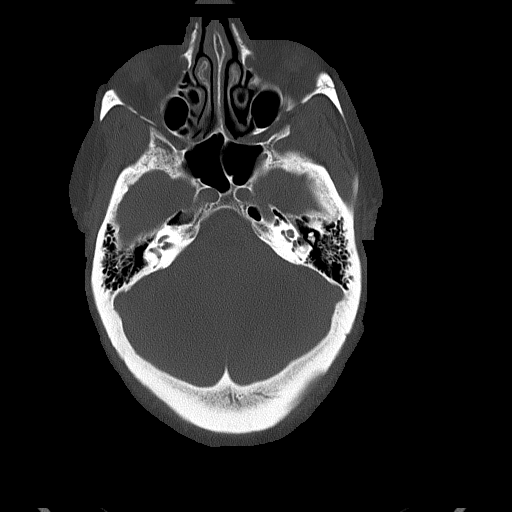
[im 25/83  bone]
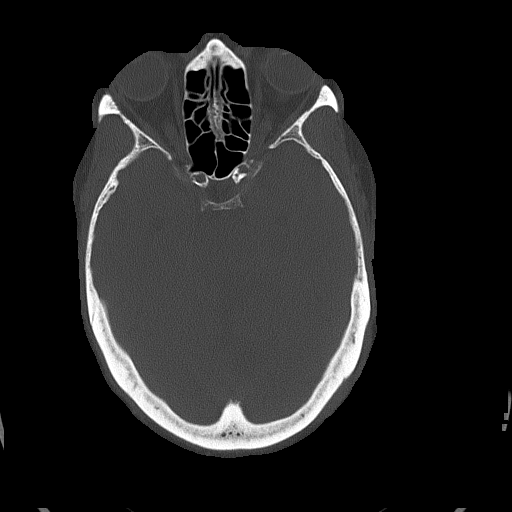

[Series 5: head without cor · coronal · non-contrast · 0.33mm/px · 3 of 73 slices shown]
[im 25/73  brain]
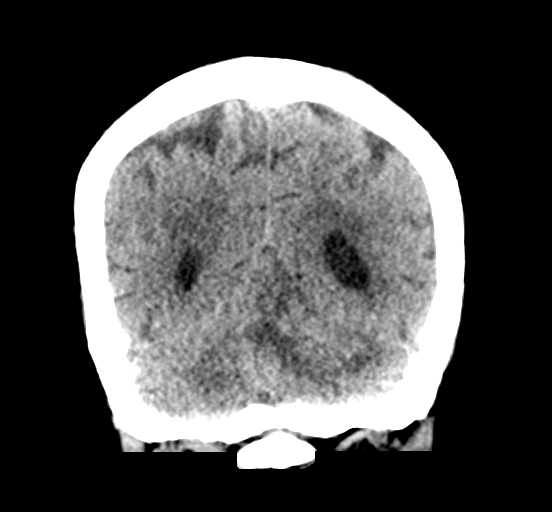
[im 33/73  brain]
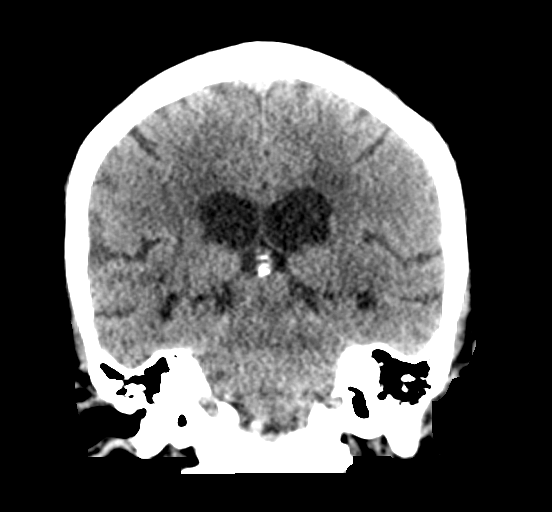
[im 41/73  brain]
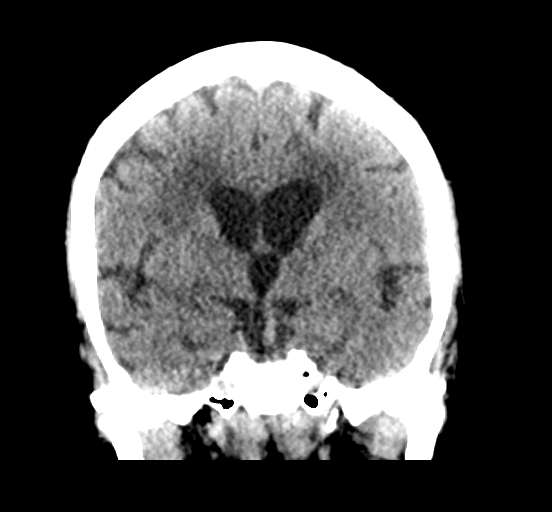

[Series 6: head without sag · sagittal · non-contrast · 0.31mm/px · 3 of 55 slices shown]
[im 19/55  brain]
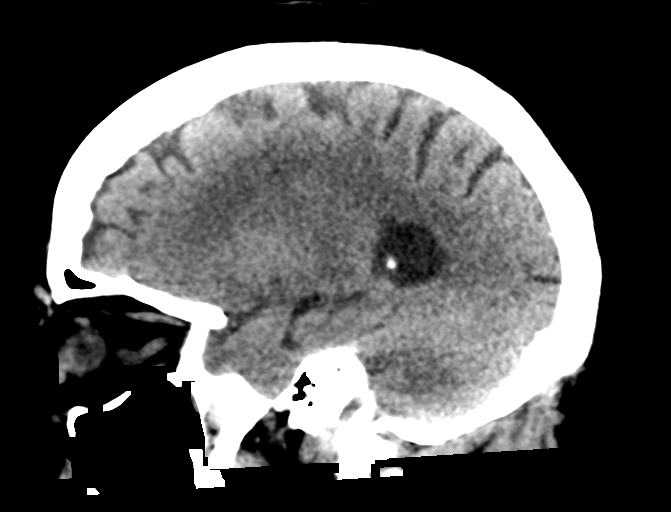
[im 28/55  brain]
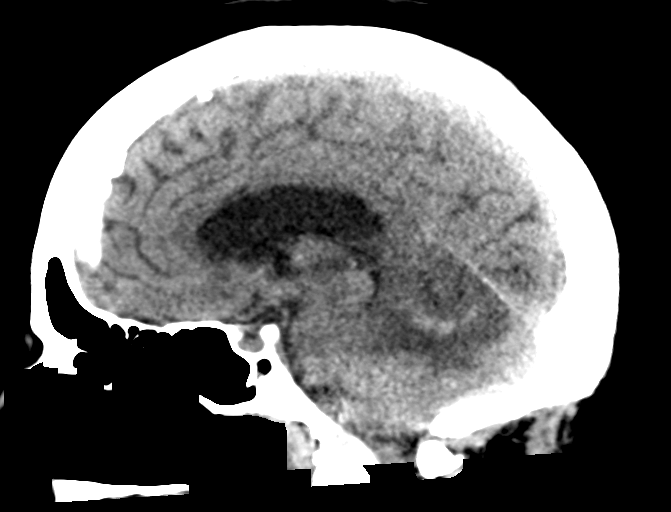
[im 37/55  brain]
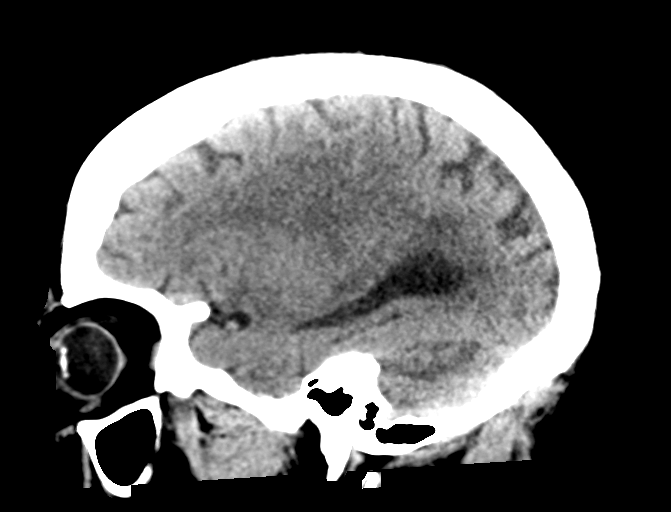

[16 of 47 positions shown; findings below may reference images not displayed]

FINDINGS: BRAIN: Stable evolving LEFT cerebellar 2.7 x 2.7 cm hematoma.
Similar surrounding low-density vasogenic edema partially effacing
the fourth ventricle. No hydrocephalus. Patchy to confluent
supratentorial white matter hypodensities. No supratentorial midline
shift. No acute large vascular territory infarct. No abnormal
extra-axial fluid collections. Basal cisterns are patent.

VASCULAR: Moderate calcific atherosclerosis of the carotid siphons.

SKULL: No skull fracture. Moderate to severe temporomandibular
osteoarthrosis. No significant scalp soft tissue swelling.

SINUSES/ORBITS: Trace LEFT maxillary sinus mucosal thickening.
Mastoid air cells are well aerated. The included ocular globes and
orbital contents are non-suspicious.

OTHER: None.
IMPRESSION: 1. Evolving LEFT cerebellar 2.7 x 2.7 cm intraparenchymal hematoma.
Partially effaced fourth ventricle without hydrocephalus.
2. Moderate to severe chronic small vessel ischemic disease.

## 2021-01-28 ENCOUNTER — Inpatient Hospital Stay (HOSPITAL_COMMUNITY)
Admission: EM | Admit: 2021-01-28 | Discharge: 2021-01-31 | DRG: 065 | Disposition: A | Discharge status: Home Health | Payer: Medicare Other | Attending: Internal Medicine | Admitting: Internal Medicine

## 2021-01-28 ENCOUNTER — Encounter (HOSPITAL_COMMUNITY): Payer: Self-pay

## 2021-01-28 ENCOUNTER — Other Ambulatory Visit: Payer: Self-pay

## 2021-01-28 DIAGNOSIS — I4891 Unspecified atrial fibrillation: Secondary | ICD-10-CM

## 2021-01-28 DIAGNOSIS — K219 Gastro-esophageal reflux disease without esophagitis: Secondary | ICD-10-CM | POA: Diagnosis present

## 2021-01-28 DIAGNOSIS — R410 Disorientation, unspecified: Secondary | ICD-10-CM

## 2021-01-28 DIAGNOSIS — R441 Visual hallucinations: Secondary | ICD-10-CM | POA: Diagnosis present

## 2021-01-28 DIAGNOSIS — I10 Essential (primary) hypertension: Secondary | ICD-10-CM | POA: Diagnosis present

## 2021-01-28 DIAGNOSIS — E876 Hypokalemia: Secondary | ICD-10-CM | POA: Diagnosis present

## 2021-01-28 DIAGNOSIS — I69354 Hemiplegia and hemiparesis following cerebral infarction affecting left non-dominant side: Secondary | ICD-10-CM

## 2021-01-28 DIAGNOSIS — N3001 Acute cystitis with hematuria: Secondary | ICD-10-CM

## 2021-01-28 DIAGNOSIS — R4182 Altered mental status, unspecified: Secondary | ICD-10-CM

## 2021-01-28 DIAGNOSIS — N390 Urinary tract infection, site not specified: Secondary | ICD-10-CM | POA: Diagnosis present

## 2021-01-28 DIAGNOSIS — E119 Type 2 diabetes mellitus without complications: Secondary | ICD-10-CM | POA: Diagnosis present

## 2021-01-28 DIAGNOSIS — F419 Anxiety disorder, unspecified: Secondary | ICD-10-CM | POA: Diagnosis present

## 2021-01-28 DIAGNOSIS — I639 Cerebral infarction, unspecified: Secondary | ICD-10-CM | POA: Diagnosis present

## 2021-01-28 DIAGNOSIS — I6300 Cerebral infarction due to thrombosis of unspecified precerebral artery: Secondary | ICD-10-CM

## 2021-01-28 DIAGNOSIS — E785 Hyperlipidemia, unspecified: Secondary | ICD-10-CM | POA: Diagnosis present

## 2021-01-28 DIAGNOSIS — F0390 Unspecified dementia without behavioral disturbance: Secondary | ICD-10-CM | POA: Diagnosis present

## 2021-01-28 DIAGNOSIS — I6340 Cerebral infarction due to embolism of unspecified cerebral artery: Principal | ICD-10-CM | POA: Diagnosis present

## 2021-01-28 DIAGNOSIS — Z79899 Other long term (current) drug therapy: Secondary | ICD-10-CM

## 2021-01-28 DIAGNOSIS — R062 Wheezing: Secondary | ICD-10-CM

## 2021-01-28 DIAGNOSIS — Z833 Family history of diabetes mellitus: Secondary | ICD-10-CM

## 2021-01-28 DIAGNOSIS — Z91041 Radiographic dye allergy status: Secondary | ICD-10-CM

## 2021-01-28 DIAGNOSIS — Z882 Allergy status to sulfonamides status: Secondary | ICD-10-CM

## 2021-01-28 DIAGNOSIS — E875 Hyperkalemia: Secondary | ICD-10-CM | POA: Diagnosis present

## 2021-01-28 DIAGNOSIS — I4892 Unspecified atrial flutter: Secondary | ICD-10-CM | POA: Diagnosis present

## 2021-01-28 DIAGNOSIS — Z8249 Family history of ischemic heart disease and other diseases of the circulatory system: Secondary | ICD-10-CM

## 2021-01-28 DIAGNOSIS — Z87891 Personal history of nicotine dependence: Secondary | ICD-10-CM

## 2021-01-28 DIAGNOSIS — F05 Delirium due to known physiological condition: Secondary | ICD-10-CM | POA: Diagnosis present

## 2021-01-28 DIAGNOSIS — R451 Restlessness and agitation: Secondary | ICD-10-CM | POA: Diagnosis present

## 2021-01-28 DIAGNOSIS — Z823 Family history of stroke: Secondary | ICD-10-CM

## 2021-01-28 DIAGNOSIS — Z888 Allergy status to other drugs, medicaments and biological substances status: Secondary | ICD-10-CM

## 2021-01-28 DIAGNOSIS — R443 Hallucinations, unspecified: Secondary | ICD-10-CM

## 2021-01-28 DIAGNOSIS — G8194 Hemiplegia, unspecified affecting left nondominant side: Secondary | ICD-10-CM | POA: Diagnosis present

## 2021-01-28 DIAGNOSIS — Z7982 Long term (current) use of aspirin: Secondary | ICD-10-CM

## 2021-01-28 DIAGNOSIS — R29702 NIHSS score 2: Secondary | ICD-10-CM | POA: Diagnosis present

## 2021-01-28 DIAGNOSIS — Z20822 Contact with and (suspected) exposure to covid-19: Secondary | ICD-10-CM | POA: Diagnosis present

## 2021-01-28 NOTE — ED Triage Notes (Signed)
Pt daughter states pt had confusion on Friday however same improved. Yesterday same occurred again and she was seen at [facility]. However pt family stated they would like an MRI done because one was not done yesterday. Today pt began with hallucination at approx 6 pm.

## 2021-01-29 ENCOUNTER — Emergency Department (HOSPITAL_COMMUNITY): Payer: Medicare Other

## 2021-01-29 ENCOUNTER — Observation Stay (HOSPITAL_COMMUNITY): Payer: Medicare Other

## 2021-01-29 DIAGNOSIS — I69354 Hemiplegia and hemiparesis following cerebral infarction affecting left non-dominant side: Secondary | ICD-10-CM | POA: Diagnosis not present

## 2021-01-29 DIAGNOSIS — R29702 NIHSS score 2: Secondary | ICD-10-CM | POA: Diagnosis not present

## 2021-01-29 DIAGNOSIS — F05 Delirium due to known physiological condition: Secondary | ICD-10-CM | POA: Diagnosis not present

## 2021-01-29 DIAGNOSIS — E119 Type 2 diabetes mellitus without complications: Secondary | ICD-10-CM | POA: Diagnosis not present

## 2021-01-29 DIAGNOSIS — Z20822 Contact with and (suspected) exposure to covid-19: Secondary | ICD-10-CM | POA: Diagnosis not present

## 2021-01-29 DIAGNOSIS — Z882 Allergy status to sulfonamides status: Secondary | ICD-10-CM | POA: Diagnosis not present

## 2021-01-29 DIAGNOSIS — Z91041 Radiographic dye allergy status: Secondary | ICD-10-CM | POA: Diagnosis not present

## 2021-01-29 DIAGNOSIS — I4892 Unspecified atrial flutter: Secondary | ICD-10-CM | POA: Diagnosis not present

## 2021-01-29 DIAGNOSIS — N390 Urinary tract infection, site not specified: Secondary | ICD-10-CM | POA: Diagnosis not present

## 2021-01-29 DIAGNOSIS — G8194 Hemiplegia, unspecified affecting left nondominant side: Secondary | ICD-10-CM | POA: Diagnosis not present

## 2021-01-29 DIAGNOSIS — I6340 Cerebral infarction due to embolism of unspecified cerebral artery: Secondary | ICD-10-CM | POA: Diagnosis not present

## 2021-01-29 DIAGNOSIS — E785 Hyperlipidemia, unspecified: Secondary | ICD-10-CM | POA: Diagnosis not present

## 2021-01-29 DIAGNOSIS — I4891 Unspecified atrial fibrillation: Secondary | ICD-10-CM | POA: Diagnosis not present

## 2021-01-29 DIAGNOSIS — E875 Hyperkalemia: Secondary | ICD-10-CM | POA: Diagnosis not present

## 2021-01-29 DIAGNOSIS — Z888 Allergy status to other drugs, medicaments and biological substances status: Secondary | ICD-10-CM | POA: Diagnosis not present

## 2021-01-29 DIAGNOSIS — I10 Essential (primary) hypertension: Secondary | ICD-10-CM | POA: Diagnosis not present

## 2021-01-29 DIAGNOSIS — I639 Cerebral infarction, unspecified: Secondary | ICD-10-CM | POA: Diagnosis not present

## 2021-01-29 DIAGNOSIS — Z8249 Family history of ischemic heart disease and other diseases of the circulatory system: Secondary | ICD-10-CM | POA: Diagnosis not present

## 2021-01-29 DIAGNOSIS — R441 Visual hallucinations: Secondary | ICD-10-CM | POA: Diagnosis not present

## 2021-01-29 DIAGNOSIS — E876 Hypokalemia: Secondary | ICD-10-CM | POA: Diagnosis not present

## 2021-01-29 DIAGNOSIS — F419 Anxiety disorder, unspecified: Secondary | ICD-10-CM | POA: Diagnosis not present

## 2021-01-29 DIAGNOSIS — K219 Gastro-esophageal reflux disease without esophagitis: Secondary | ICD-10-CM | POA: Diagnosis not present

## 2021-01-29 DIAGNOSIS — R443 Hallucinations, unspecified: Secondary | ICD-10-CM | POA: Diagnosis present

## 2021-01-29 DIAGNOSIS — F0390 Unspecified dementia without behavioral disturbance: Secondary | ICD-10-CM | POA: Diagnosis not present

## 2021-01-29 DIAGNOSIS — Z833 Family history of diabetes mellitus: Secondary | ICD-10-CM | POA: Diagnosis not present

## 2021-01-29 DIAGNOSIS — R451 Restlessness and agitation: Secondary | ICD-10-CM | POA: Diagnosis not present

## 2021-01-29 LAB — CBC WITH DIFFERENTIAL/PLATELET
Abs Immature Granulocytes: 0.01 10*3/uL (ref 0.00–0.07)
Basophils Absolute: 0 10*3/uL (ref 0.0–0.1)
Basophils Relative: 1 %
Eosinophils Absolute: 0.2 10*3/uL (ref 0.0–0.5)
Eosinophils Relative: 4 %
HCT: 38.4 % (ref 36.0–46.0)
Hemoglobin: 12.8 g/dL (ref 12.0–15.0)
Immature Granulocytes: 0 %
Lymphocytes Relative: 42 %
Lymphs Abs: 2.3 10*3/uL (ref 0.7–4.0)
MCH: 31.7 pg (ref 26.0–34.0)
MCHC: 33.3 g/dL (ref 30.0–36.0)
MCV: 95 fL (ref 80.0–100.0)
Monocytes Absolute: 0.6 10*3/uL (ref 0.1–1.0)
Monocytes Relative: 11 %
Neutro Abs: 2.3 10*3/uL (ref 1.7–7.7)
Neutrophils Relative %: 42 %
Platelets: 207 10*3/uL (ref 150–400)
RBC: 4.04 MIL/uL (ref 3.87–5.11)
RDW: 13.3 % (ref 11.5–15.5)
WBC: 5.5 10*3/uL (ref 4.0–10.5)
nRBC: 0 % (ref 0.0–0.2)

## 2021-01-29 LAB — URINALYSIS, ROUTINE W REFLEX MICROSCOPIC
Bilirubin Urine: NEGATIVE
Glucose, UA: NEGATIVE mg/dL
Ketones, ur: NEGATIVE mg/dL
Leukocytes,Ua: NEGATIVE
Nitrite: NEGATIVE
Protein, ur: NEGATIVE mg/dL
Specific Gravity, Urine: 1.003 — ABNORMAL LOW (ref 1.005–1.030)
pH: 6 (ref 5.0–8.0)

## 2021-01-29 LAB — TROPONIN I (HIGH SENSITIVITY)
Troponin I (High Sensitivity): 22 ng/L — ABNORMAL HIGH (ref <18)
Troponin I (High Sensitivity): 22 ng/L — ABNORMAL HIGH (ref <18)

## 2021-01-29 LAB — PROTIME-INR
INR: 0.9 (ref 0.8–1.2)
Prothrombin Time: 12.4 seconds (ref 11.4–15.2)

## 2021-01-29 LAB — BASIC METABOLIC PANEL
Anion gap: 8 (ref 5–15)
BUN: 14 mg/dL (ref 8–23)
CO2: 28 mmol/L (ref 22–32)
Calcium: 9.3 mg/dL (ref 8.9–10.3)
Chloride: 105 mmol/L (ref 98–111)
Creatinine, Ser: 0.79 mg/dL (ref 0.44–1.00)
GFR, Estimated: >60 mL/min (ref >60)
Glucose, Bld: 121 mg/dL — ABNORMAL HIGH (ref 70–99)
Potassium: 4.5 mmol/L (ref 3.5–5.1)
Sodium: 141 mmol/L (ref 135–145)

## 2021-01-29 LAB — RESP PANEL BY RT-PCR (FLU A&B, COVID) ARPGX2
Influenza A by PCR: NEGATIVE
Influenza B by PCR: NEGATIVE
SARS Coronavirus 2 by RT PCR: NEGATIVE

## 2021-01-29 LAB — AMMONIA: Ammonia: 28 umol/L (ref 9–35)

## 2021-01-29 MED ORDER — SODIUM CHLORIDE 0.9 % IV BOLUS
500.0000 mL | Freq: Once | INTRAVENOUS | Status: AC
  Administered 2021-01-29: 500 mL via INTRAVENOUS

## 2021-01-29 MED ORDER — ACETAMINOPHEN 325 MG PO TABS
650.0000 mg | ORAL_TABLET | ORAL | Status: DC | PRN
  Administered 2021-01-31: 650 mg via ORAL
  Filled 2021-01-29 (×2): qty 2

## 2021-01-29 MED ORDER — LORAZEPAM 2 MG/ML IJ SOLN
INTRAMUSCULAR | Status: AC
  Administered 2021-01-29: 1 mg via INTRAVENOUS
  Filled 2021-01-29: qty 1

## 2021-01-29 MED ORDER — ACETAMINOPHEN 650 MG RE SUPP: 650.0000 mg | RECTAL | Status: DC | PRN

## 2021-01-29 MED ORDER — SENNOSIDES-DOCUSATE SODIUM 8.6-50 MG PO TABS: 1.0000 | ORAL_TABLET | Freq: Every evening | ORAL | Status: DC | PRN

## 2021-01-29 MED ORDER — CLOPIDOGREL BISULFATE 75 MG PO TABS
75.0000 mg | ORAL_TABLET | Freq: Every day | ORAL | Status: DC
  Administered 2021-01-29 – 2021-01-31 (×3): 75 mg via ORAL
  Filled 2021-01-29 (×3): qty 1

## 2021-01-29 MED ORDER — ZIPRASIDONE MESYLATE 20 MG IM SOLR
15.0000 mg | Freq: Once | INTRAMUSCULAR | Status: AC | PRN
  Administered 2021-01-29: 15 mg via INTRAMUSCULAR
  Filled 2021-01-29: qty 20

## 2021-01-29 MED ORDER — ACETAMINOPHEN 160 MG/5ML PO SOLN: 650.0000 mg | ORAL | Status: DC | PRN

## 2021-01-29 MED ORDER — STERILE WATER FOR INJECTION IJ SOLN
INTRAMUSCULAR | Status: AC
  Filled 2021-01-29: qty 10

## 2021-01-29 MED ORDER — STROKE: EARLY STAGES OF RECOVERY BOOK
Freq: Once | Status: AC
  Filled 2021-01-29: qty 1

## 2021-01-29 MED ORDER — ZIPRASIDONE MESYLATE 20 MG IM SOLR: 15.0000 mg | Freq: Once | INTRAMUSCULAR | Status: DC

## 2021-01-29 MED ORDER — ENOXAPARIN SODIUM 40 MG/0.4ML IJ SOSY
40.0000 mg | PREFILLED_SYRINGE | INTRAMUSCULAR | Status: DC
  Administered 2021-01-29 – 2021-01-31 (×3): 40 mg via SUBCUTANEOUS
  Filled 2021-01-29 (×3): qty 0.4

## 2021-01-29 MED ORDER — LORAZEPAM 2 MG/ML IJ SOLN: 1.0000 mg | Freq: Once | INTRAMUSCULAR | Status: AC

## 2021-01-29 MED ORDER — ASPIRIN EC 81 MG PO TBEC
81.0000 mg | DELAYED_RELEASE_TABLET | Freq: Every day | ORAL | Status: DC
  Administered 2021-01-30 – 2021-01-31 (×2): 81 mg via ORAL
  Filled 2021-01-29 (×2): qty 1

## 2021-01-29 NOTE — ED Notes (Signed)
Pt transferred over from  [facility] ed to get a mro.  The pt has been seeing and talling to people not  Visible  To anyone else. Confused since Friday

## 2021-01-29 NOTE — ED Notes (Signed)
To mri  Pt not lying still enough  Had ativan at 0200am  Mri bring ing her back to rnm 22  Her son id with her in mri

## 2021-01-29 NOTE — ED Provider Notes (Signed)
[facility] EMERGENCY DEPARTMENT Provider Note   CSN: [id number] Arrival date & time: 01/28/21  2149     History Chief Complaint  Patient presents with   Hallucinations    Ashley Chan is a 87 y.o. female.  Patient is an 87 year old female with past medical history of diabetes, hypertension, prior cerebellar stroke.  Patient presenting today for evaluation of weakness, confusion, and altered mental status.  According to the daughter, patient started 2 days ago with hallucinations.  She has described "seeing snakes" and things crawling up the walls.  She has also been more confused and daughter believes that she is having left arm numbness.  She was apparently having difficulty holding objects and feeding herself with her left hand.  She also complained of headache yesterday.  She was seen yesterday at [facility].  She underwent work-up including with daughter reports was a CT scan, urinalysis, and blood work, but no cause was found.  Daughter is somewhat unhappy that she was discharged.  She feels as though she needs an MRI as her prior stroke 5 years ago was not found on CAT scan.  The history is provided by the patient.      Past Medical History:  Diagnosis Date   Anxiety    Ataxia, post-stroke    Back pain    Chronic midline low back pain without sciatica    CVA (cerebral vascular accident) ([place]) 03/03/2017   Hypertension    Right hip pain     Patient Active Problem List   Diagnosis Date Noted   History of stroke 06/16/2018   Hyperlipidemia 12/03/2017   Ataxia due to recent stroke 08/07/2017   Bilateral primary osteoarthritis of knee 08/07/2017   Spinal stenosis in cervical region 03/14/2017   Cerebellar stroke, acute ([place]) 03/10/2017   Benign neoplasm of transverse colon    Colon ulcer    Neurologic gait disorder    Hypokalemia    Dizziness and giddiness    Coffee ground emesis    Heme + stool    Duodenitis    Benign essential HTN    Chronic midline  low back pain without sciatica    Anxiety state    Ataxia, post-stroke    ICH (intracerebral hemorrhage) ([facility]) -  Left superior cerebellar hematoma with increased vasogenic edema and mass effect on adjacent fourth ventricle without hydrocephalus 03/03/2017   CVA (cerebral vascular accident) ([place]) 03/03/2017    Past Surgical History:  Procedure Laterality Date   BACK SURGERY  2011   CATARACT EXTRACTION, BILATERAL  1999   COLONOSCOPY WITH PROPOFOL N/A 03/10/2017   Procedure: COLONOSCOPY WITH PROPOFOL;  Surgeon: Ladene Artist, MD;  Location: [facility] ENDOSCOPY;  Service: Endoscopy;  Laterality: N/A;   ESOPHAGOGASTRODUODENOSCOPY N/A 03/09/2017   Procedure: ESOPHAGOGASTRODUODENOSCOPY (EGD);  Surgeon: [name], MD;  Location: [facility] ENDOSCOPY;  Service: Endoscopy;  Laterality: N/A;     OB History   No obstetric history on file.     Family History  Problem Relation Age of Onset   Stroke Mother 53   Lung disease Father        "black lung"   Stroke Son     Social History   Tobacco Use   Smoking status: Former    Pack years: 0.00    Types: Cigarettes    Start date: 1949    Quit date: 1963    Years since quitting: 59.5   Smokeless tobacco: Never  Substance Use Topics   Alcohol use: No  Drug use: No    Home Medications Prior to Admission medications   Medication Sig Start Date End Date Taking? Authorizing Provider  amLODipine (NORVASC) 10 MG tablet Take 1 tablet (10 mg total) by mouth daily. 03/21/17   Angiulli, Lavon Paganini, PA-C  aspirin EC 325 MG tablet Take 1 tablet (325 mg total) by mouth daily. 05/19/17   Garvin Fila, MD  atorvastatin (LIPITOR) 40 MG tablet Take 1 tablet (40 mg total) by mouth daily at 6 PM. 03/20/17   Angiulli, Lavon Paganini, PA-C  clonazePAM (KLONOPIN) 0.5 MG tablet Take 1 tablet (0.5 mg total) by mouth daily as needed for anxiety. 03/20/17   Angiulli, Lavon Paganini, PA-C  diclofenac sodium (VOLTAREN) 1 % GEL Apply 1 g topically 2 (two) times daily as needed (for  pain or inflammation). TO AFFECTED AREAS 01/01/17   [provider]  fluticasone (FLONASE) 50 MCG/ACT nasal spray Place 2 sprays into both nostrils daily as needed for allergies or rhinitis.  02/25/17   [provider]  hydrochlorothiazide (HYDRODIURIL) 25 MG tablet Take 1 tablet (25 mg total) by mouth daily. 03/21/17   Angiulli, Lavon Paganini, PA-C  labetalol (NORMODYNE) 100 MG tablet Take 1 tablet (100 mg total) by mouth 2 (two) times daily. 03/20/17   Angiulli, Lavon Paganini, PA-C  pantoprazole (PROTONIX) 40 MG tablet Take 1 tablet (40 mg total) by mouth daily. 03/21/17   Angiulli, Lavon Paganini, PA-C  potassium chloride (K-DUR) 10 MEQ tablet Take 1 tablet (10 mEq total) by mouth daily. 03/20/17   Angiulli, Lavon Paganini, PA-C  PROAIR HFA 108 (90 Base) MCG/ACT inhaler Inhale 1 puff into the lungs every 6 (six) hours as needed for shortness of breath or wheezing. 01/30/17   [provider]  UNABLE TO FIND Omega xl    [provider]  Vitamin D, Ergocalciferol, (DRISDOL) 50000 units CAPS capsule Take 50,000 Units by mouth every 7 (seven) days. 12/09/16   [provider]    Allergies    Lidocaine, Propranolol, and Sulfa antibiotics  Review of Systems   Review of Systems  All other systems reviewed and are negative.  Physical Exam Updated Vital Signs BP (!) 163/77 (BP Location: Right Arm)   Pulse 63   Temp 98.4 F (36.9 C) (Oral)   Resp 16   Ht 5\' 6"  (1.676 m)   Wt 80.7 kg   SpO2 98%   BMI 28.73 kg/m   Physical Exam Vitals and nursing note reviewed.  Constitutional:      General: She is not in acute distress.    Appearance: She is well-developed. She is not diaphoretic.  HENT:     Head: Normocephalic and atraumatic.  Eyes:     Extraocular Movements: Extraocular movements intact.     Pupils: Pupils are equal, round, and reactive to light.  Cardiovascular:     Rate and Rhythm: Normal rate and regular rhythm.     Heart sounds: No murmur heard.   No friction rub.  No gallop.  Pulmonary:     Effort: Pulmonary effort is normal. No respiratory distress.     Breath sounds: Normal breath sounds. No wheezing.  Abdominal:     General: Bowel sounds are normal. There is no distension.     Palpations: Abdomen is soft.     Tenderness: There is no abdominal tenderness.  Musculoskeletal:        General: Normal range of motion.     Cervical back: Normal range of motion and neck supple.  Skin:    General: Skin is warm and dry.  Neurological:     General: No focal deficit present.     Mental Status: She is alert and oriented to person, place, and time.     Cranial Nerves: No cranial nerve deficit.     Sensory: No sensory deficit.     Motor: Weakness present.     Coordination: Coordination normal.     Comments: Strength is 5 out of 5 in the right upper and both lower extremities.  Strength is 4+ out of 5 in the left upper extremity.  Finger-to-nose and somewhat slow with the left hand.    ED Results / Procedures / Treatments   Labs (all labs ordered are listed, but only abnormal results are displayed) Labs Reviewed - No data to display  EKG None  Radiology No results found.  Procedures Procedures   Medications Ordered in ED Medications  sodium chloride 0.9 % bolus 500 mL (has no administration in time range)    ED Course  I have reviewed the triage vital signs and the nursing notes.  Pertinent labs & imaging results that were available during my care of the patient were reviewed by me and considered in my medical decision making (see chart for details).    MDM Rules/Calculators/A&P  Patient brought by daughter for evaluation of altered mental status as described in the HPI.  Patient seems confused, hallucinating, and picking at her gown.  She also seems to have weakness of the left arm on my exam.  Patient CT and laboratory studies are unremarkable.  Daughter is convinced she may be having a stroke as she had similar symptoms with prior  cerebellar stroke several years ago.  I have discussed the care with Dr. Lorrin Goodell who feels as though MRI is appropriate given the left arm deficit.  Patient will be transferred to [facility] as we have no MRI here over the holiday weekend and no neurologist for the next week.  Patient to be an ER to ER transfer with Dr. Dorthey Sawyer accepting.  Final Clinical Impression(s) / ED Diagnoses Final diagnoses:  None    Rx / DC Orders ED Discharge Orders     None        Veryl Speak, MD 01/29/21 [phone]

## 2021-01-29 NOTE — Progress Notes (Signed)
Patient appears to be sound asleep , will not awake for EEG. Will try in morning.

## 2021-01-29 NOTE — ED Notes (Signed)
Pt refusing vitals.

## 2021-01-29 NOTE — ED Notes (Signed)
ED TO INPATIENT HANDOFF REPORT  ED Nurse Name and Phone #: Mickle Asper [phone]  S Name/Age/Gender Ashley Chan 87 y.o. female Room/Bed: 041C/041C  Code Status   Code Status: Full Code  Home/SNF/Other Home Patient oriented to: self Is this baseline? Yes   Triage Complete: Triage complete  Chief Complaint Stroke [facility]) [I63.9]  Triage Note Pt daughter states pt had confusion on Friday however same improved. Yesterday same occurred again and she was seen at [facility]. However pt family stated they would like an MRI done because one was not done yesterday. Today pt began with hallucination at approx 6 pm.     Allergies Allergies  Allergen Reactions  . Lidocaine     Reaction (?)  . Propranolol     Reaction (?)  . Sulfa Antibiotics     Reaction (?)    Level of Care/Admitting Diagnosis ED Disposition    ED Disposition  Admit   Condition  --   Comment  Hospital Area: [place] [[id number]]  Level of Care: Telemetry Medical [104]  May place patient in observation at [facility] or [place] if equivalent level of care is available:: No  Covid Evaluation: Asymptomatic Screening Protocol (No Symptoms)  Diagnosis: Stroke [facility]) [[id number]]  Admitting Physician: Velna Ochs [[id number]]  Attending Physician: Velna Ochs [[id number]]         B Medical/Surgery History Past Medical History:  Diagnosis Date  . Anxiety   . Ataxia, post-stroke   . Back pain   . Chronic midline low back pain without sciatica   . CVA (cerebral vascular accident) ([place]) 03/03/2017  . Hypertension   . Right hip pain    Past Surgical History:  Procedure Laterality Date  . BACK SURGERY  2011  . CATARACT EXTRACTION, BILATERAL  1999  . COLONOSCOPY WITH PROPOFOL N/A 03/10/2017   Procedure: COLONOSCOPY WITH PROPOFOL;  Surgeon: Ladene Artist, MD;  Location: [facility] ENDOSCOPY;  Service: Endoscopy;  Laterality: N/A;  . ESOPHAGOGASTRODUODENOSCOPY N/A 03/09/2017   Procedure:  ESOPHAGOGASTRODUODENOSCOPY (EGD);  Surgeon: [name], MD;  Location: [facility] ENDOSCOPY;  Service: Endoscopy;  Laterality: N/A;     A IV Location/Drains/Wounds Patient Lines/Drains/Airways Status    Active Line/Drains/Airways    Name Placement date Placement time Site Days   Peripheral IV 01/29/21 20 G Right Antecubital 01/29/21  0110  Antecubital  less than 1          Intake/Output Last 24 hours No intake or output data in the 24 hours ending 01/29/21 1416  Labs/Imaging Results for orders placed or performed during the hospital encounter of 01/28/21 (from the past 48 hour(s))  Basic metabolic panel     Status: Abnormal   Collection Time: 01/29/21  1:05 AM  Result Value Ref Range   Sodium 141 135 - 145 mmol/L   Potassium 4.5 3.5 - 5.1 mmol/L   Chloride 105 98 - 111 mmol/L   CO2 28 22 - 32 mmol/L   Glucose, Bld 121 (H) 70 - 99 mg/dL    Comment: Glucose reference range applies only to samples taken after fasting for at least 8 hours.   BUN 14 8 - 23 mg/dL   Creatinine, Ser 0.79 0.44 - 1.00 mg/dL   Calcium 9.3 8.9 - 10.3 mg/dL   GFR, Estimated >60 >60 mL/min    Comment: (NOTE) Calculated using the CKD-EPI Creatinine Equation (2021)    Anion gap 8 5 - 15    Comment: Performed at [facility], [place]  [address], [place], [place] [address]  CBC with Differential     Status: None   Collection Time: 01/29/21  1:05 AM  Result Value Ref Range   WBC 5.5 4.0 - 10.5 K/uL   RBC 4.04 3.87 - 5.11 MIL/uL   Hemoglobin 12.8 12.0 - 15.0 g/dL   HCT 38.4 36.0 - 46.0 %   MCV 95.0 80.0 - 100.0 fL   MCH 31.7 26.0 - 34.0 pg   MCHC 33.3 30.0 - 36.0 g/dL   RDW 13.3 11.5 - 15.5 %   Platelets 207 150 - 400 K/uL   nRBC 0.0 0.0 - 0.2 %   Neutrophils Relative % 42 %   Neutro Abs 2.3 1.7 - 7.7 K/uL   Lymphocytes Relative 42 %   Lymphs Abs 2.3 0.7 - 4.0 K/uL   Monocytes Relative 11 %   Monocytes Absolute 0.6 0.1 - 1.0 K/uL   Eosinophils Relative 4 %   Eosinophils Absolute 0.2 0.0 - 0.5 K/uL    Basophils Relative 1 %   Basophils Absolute 0.0 0.0 - 0.1 K/uL   Immature Granulocytes 0 %   Abs Immature Granulocytes 0.01 0.00 - 0.07 K/uL    Comment: Performed at [facility], [address], [place], [place] [address]  Urinalysis, Routine w reflex microscopic Urine, Clean Catch     Status: Abnormal   Collection Time: 01/29/21  1:40 AM  Result Value Ref Range   Color, Urine COLORLESS (A) YELLOW   APPearance CLEAR CLEAR   Specific Gravity, Urine 1.003 (L) 1.005 - 1.030   pH 6.0 5.0 - 8.0   Glucose, UA NEGATIVE NEGATIVE mg/dL   Hgb urine dipstick SMALL (A) NEGATIVE   Bilirubin Urine NEGATIVE NEGATIVE   Ketones, ur NEGATIVE NEGATIVE mg/dL   Protein, ur NEGATIVE NEGATIVE mg/dL   Nitrite NEGATIVE NEGATIVE   Leukocytes,Ua NEGATIVE NEGATIVE   WBC, UA 0-5 0 - 5 WBC/hpf   Bacteria, UA RARE (A) NONE SEEN   Squamous Epithelial / LPF 0-5 0 - 5    Comment: Performed at [facility], [address], [place], [place] [address]  Resp Panel by RT-PCR (Flu A&B, Covid) Nasopharyngeal Swab     Status: None   Collection Time: 01/29/21  2:14 AM   Specimen: Nasopharyngeal Swab; Nasopharyngeal(NP) swabs in vial transport medium  Result Value Ref Range   SARS Coronavirus 2 by RT PCR NEGATIVE NEGATIVE    Comment: (NOTE) SARS-CoV-2 target nucleic acids are NOT DETECTED.  The SARS-CoV-2 RNA is generally detectable in upper respiratory specimens during the acute phase of infection. The lowest concentration of SARS-CoV-2 viral copies this assay can detect is 138 copies/mL. A negative result does not preclude SARS-Cov-2 infection and should not be used as the sole basis for treatment or other patient management decisions. A negative result may occur with  improper specimen collection/handling, submission of specimen other than nasopharyngeal swab, presence of viral mutation(s) within the areas targeted by this assay, and inadequate number of viral copies(<138 copies/mL). A negative result must be combined  with clinical observations, patient history, and epidemiological information. The expected result is Negative.  Fact Sheet for Patients:  EntrepreneurPulse.com.au  Fact Sheet for Healthcare Providers:  IncredibleEmployment.be  This test is no t yet approved or cleared by the [place] FDA and  has been authorized for detection and/or diagnosis of SARS-CoV-2 by FDA under an Emergency Use Authorization (EUA). This EUA will remain  in effect (meaning this test can be used) for the duration of the COVID-19 declaration  under Section 564(b)(1) of the Act, 21 U.S.C.section 360bbb-3(b)(1), unless the authorization is terminated  or revoked sooner.       Influenza A by PCR NEGATIVE NEGATIVE   Influenza B by PCR NEGATIVE NEGATIVE    Comment: (NOTE) The Xpert Xpress SARS-CoV-2/FLU/RSV plus assay is intended as an aid in the diagnosis of influenza from Nasopharyngeal swab specimens and should not be used as a sole basis for treatment. Nasal washings and aspirates are unacceptable for Xpert Xpress SARS-CoV-2/FLU/RSV testing.  Fact Sheet for Patients: EntrepreneurPulse.com.au  Fact Sheet for Healthcare Providers: IncredibleEmployment.be  This test is not yet approved or cleared by the [place] FDA and has been authorized for detection and/or diagnosis of SARS-CoV-2 by FDA under an Emergency Use Authorization (EUA). This EUA will remain in effect (meaning this test can be used) for the duration of the COVID-19 declaration under Section 564(b)(1) of the Act, 21 U.S.C. section 360bbb-3(b)(1), unless the authorization is terminated or revoked.  Performed at [facility], [address] [place], [place] [address]   Troponin I (High Sensitivity)     Status: Abnormal   Collection Time: 01/29/21  7:51 AM  Result Value Ref Range   Troponin I (High Sensitivity) 22 (H) <18 ng/L    Comment: (NOTE) Elevated high  sensitivity troponin I (hsTnI) values and significant  changes across serial measurements may suggest ACS but many other  chronic and acute conditions are known to elevate hsTnI results.  Refer to the "Links" section for chest pain algorithms and additional  guidance. Performed at [facility], [place] [address], [place], [place] [address]   Ammonia     Status: None   Collection Time: 01/29/21  7:51 AM  Result Value Ref Range   Ammonia 28 9 - 35 umol/L    Comment: Performed at [facility], [place] [address] [place], [place] [address]  Troponin I (High Sensitivity)     Status: Abnormal   Collection Time: 01/29/21 12:54 PM  Result Value Ref Range   Troponin I (High Sensitivity) 22 (H) <18 ng/L    Comment: (NOTE) Elevated high sensitivity troponin I (hsTnI) values and significant  changes across serial measurements may suggest ACS but many other  chronic and acute conditions are known to elevate hsTnI results.  Refer to the "Links" section for chest pain algorithms and additional  guidance. Performed at [facility], [place] [address] [place], [place] [address]   Protime-INR     Status: None   Collection Time: 01/29/21 12:54 PM  Result Value Ref Range   Prothrombin Time 12.4 11.4 - 15.2 seconds   INR 0.9 0.8 - 1.2    Comment: (NOTE) INR goal varies based on device and disease states. Performed at [facility], [place] [address], [place], [place] [address]    CT Head Wo Contrast  Result Date: 01/29/2021 CLINICAL DATA:  Altered mental status EXAM: CT HEAD WITHOUT CONTRAST TECHNIQUE: Contiguous axial images were obtained from the base of the skull through the vertex without intravenous contrast. COMPARISON:  03/10/2017 FINDINGS: Brain: No evidence of acute infarction, hemorrhage, hydrocephalus, extra-axial collection or mass lesion/mass effect. Subcortical white matter and periventricular small vessel ischemic changes. In 87 changes related to old left cerebellar  infarct (series 2/image 10). Vascular: Intracranial atherosclerosis. Skull: Normal. Negative for fracture or focal lesion. Sinuses/Orbits: The visualized paranasal sinuses are essentially clear. The mastoid air cells are unopacified. Other: None. IMPRESSION: No evidence of acute intracranial abnormality. Old left  cerebellar infarct. Small vessel ischemic changes. Electronically Signed   By: Julian Hy M.D.   On: 01/29/2021 01:52   MR ANGIO HEAD WO CONTRAST  Result Date: 01/29/2021 CLINICAL DATA:  Small acute stroke on MRI brain EXAM: MRA HEAD WITHOUT CONTRAST MRA NECK WITHOUT CONTRAST TECHNIQUE: Angiographic images of the Circle of Willis were obtained using MRA technique without intravenous contrast. Angiographic images of the neck were obtained using MRA technique without intravenous contrast. Carotid stenosis measurements (when applicable) are obtained utilizing NASCET criteria, using the distal internal carotid diameter as the denominator. COMPARISON:  None. FINDINGS: MRA HEAD Motion artifact is present. Intracranial internal carotid arteries are patent with atherosclerotic irregularity. Moderate stenosis of the paraclinoid left ICA. Middle and anterior cerebral arteries are patent. Dominant right A1 ACA and congenitally absent left A1 ACA. Anterior communicating artery is present. Intracranial vertebral arteries, basilar artery, posterior cerebral arteries are patent. There is focal high-grade stenosis of the mid right P2 PCA. Right communicating arteries present. No aneurysm. MRA NECK Significant motion artifact is present. Visualized portions of the carotids and vertebral arteries are patent. Stenosis cannot be evaluated. IMPRESSION: No proximal intracranial vessel occlusion. Moderate stenosis of the paraclinoid left ICA. Focal high-grade stenosis mid right P2 PCA. MRA neck is significantly degraded and essentially nondiagnostic or stenosis evaluation. Electronically Signed   By: Macy Mis M.D.    On: 01/29/2021 13:28   MR ANGIO NECK WO CONTRAST  Result Date: 01/29/2021 CLINICAL DATA:  Small acute stroke on MRI brain EXAM: MRA HEAD WITHOUT CONTRAST MRA NECK WITHOUT CONTRAST TECHNIQUE: Angiographic images of the Circle of Willis were obtained using MRA technique without intravenous contrast. Angiographic images of the neck were obtained using MRA technique without intravenous contrast. Carotid stenosis measurements (when applicable) are obtained utilizing NASCET criteria, using the distal internal carotid diameter as the denominator. COMPARISON:  None. FINDINGS: MRA HEAD Motion artifact is present. Intracranial internal carotid arteries are patent with atherosclerotic irregularity. Moderate stenosis of the paraclinoid left ICA. Middle and anterior cerebral arteries are patent. Dominant right A1 ACA and congenitally absent left A1 ACA. Anterior communicating artery is present. Intracranial vertebral arteries, basilar artery, posterior cerebral arteries are patent. There is focal high-grade stenosis of the mid right P2 PCA. Right communicating arteries present. No aneurysm. MRA NECK Significant motion artifact is present. Visualized portions of the carotids and vertebral arteries are patent. Stenosis cannot be evaluated. IMPRESSION: No proximal intracranial vessel occlusion. Moderate stenosis of the paraclinoid left ICA. Focal high-grade stenosis mid right P2 PCA. MRA neck is significantly degraded and essentially nondiagnostic or stenosis evaluation. Electronically Signed   By: Macy Mis M.D.   On: 01/29/2021 13:28   MR BRAIN WO CONTRAST  Result Date: 01/29/2021 CLINICAL DATA:  87 year old female with altered mental status. EXAM: MRI HEAD WITHOUT CONTRAST TECHNIQUE: Multiplanar, multiecho pulse sequences of the brain and surrounding structures were obtained without intravenous contrast. COMPARISON:  Head CT 0114 hours today. Report of [facility] Brain MRI 03/03/2017 (no images available). FINDINGS: Brain:  Small cortical focus of restricted diffusion in the posterior right hemisphere at the lateral right inferior parietal lobe (series 2, image 27 and series 3, image 9). Associated mild T2 and FLAIR hyperintensity. No hemorrhage or mass effect. Left cerebellar encephalomalacia from previous hemorrhagic SCA infarct. Hemosiderin and gliosis. No other restricted diffusion. No midline shift, mass effect, evidence of mass lesion, ventriculomegaly, extra-axial collection or acute intracranial hemorrhage. Cervicomedullary junction and pituitary are within normal limits. Patchy and confluent bilateral cerebral white matter  T2 and FLAIR hyperintensity. Deep gray matter nuclei and brainstem remain normal. No other convincing chronic cerebral blood products. Vascular: Major intracranial vascular flow voids are preserved with intracranial artery tortuosity and irregularity. Skull and upper cervical spine: Widespread cervical spine degeneration including disc degeneration at C4-C5 resulting in at least mild cervical spinal stenosis on series 7, image 13. Visualized bone marrow signal is within normal limits. Sinuses/Orbits: Postoperative changes to both globes. Left maxillary sinus mucous retention cyst. Other: Mastoids are clear.  Negative visible scalp and face. IMPRESSION: 1. Very small cortical acute cortical infarct in the inferolateral right parietal lobe. No associated hemorrhage or mass effect. 2. Chronic hemorrhagic infarct of the left cerebellum in the SCA territory. 3. Superimposed advanced bilateral cerebral white matter signal changes, most commonly due to chronic small vessel disease. 4. Cervical spine degeneration with at least mild degenerative spinal stenosis suspected at C4-C5. Electronically Signed   By: Genevie Ann M.D.   On: 01/29/2021 09:08    Pending Labs Unresulted Labs (From admission, onward)    Start     Ordered   01/30/21 0500  Hemoglobin A1c  (Labs)  Tomorrow morning,   R        01/29/21 1014    01/30/21 0500  Lipid panel  (Labs)  Tomorrow morning,   R       Comments: Fasting    01/29/21 1014          Vitals/Pain Today's Vitals   01/29/21 0748 01/29/21 0800 01/29/21 0815 01/29/21 1255  BP:  (!) 165/83 (!) 146/65 (!) 176/93  Pulse:  61 (!) 55 (!) 139  Resp:  16 13 14   Temp:      TempSrc:      SpO2:  100% 100% 92%  Weight:      Height:      PainSc: 0-No pain       Isolation Precautions No active isolations  Medications Medications  acetaminophen (TYLENOL) tablet 650 mg (has no administration in time range)    Or  acetaminophen (TYLENOL) 160 MG/5ML solution 650 mg (has no administration in time range)    Or  acetaminophen (TYLENOL) suppository 650 mg (has no administration in time range)  senna-docusate (Senokot-S) tablet 1 tablet (has no administration in time range)  enoxaparin (LOVENOX) injection 40 mg (40 mg Subcutaneous Given 01/29/21 1259)  sodium chloride 0.9 % bolus 500 mL (0 mLs Intravenous Stopped 01/29/21 0141)  LORazepam (ATIVAN) injection 1 mg (1 mg Intravenous Given 01/29/21 0201)  ziprasidone (GEODON) injection 15 mg (15 mg Intramuscular Given 01/29/21 0816)  sterile water (preservative free) injection (  Given 01/29/21 0816)   stroke: mapping our early stages of recovery book ( Does not apply Given 01/29/21 1259)    Mobility non-ambulatory Low fall risk      NIH Stroke Scale ( + Modified Stroke Scale Criteria)  Interval: Initial Level of Consciousness (1a.)   : Alert, keenly responsive LOC Questions (1b. )   +: Answers one question correctly LOC Commands (1c. )   + : Performs both tasks correctly Best Gaze (2. )  +: Normal Visual (3. )  +: No visual loss Facial Palsy (4. )    : Normal symmetrical movements Motor Arm, Left (5a. )   +: No drift Motor Arm, Right (5b. )   +: No drift Motor Leg, Left (6a. )   +: No drift Motor Leg, Right (6b. )   +: No drift Limb Ataxia (7. ): Absent Sensory (8. )   +:  Normal, no sensory loss Best Language (9. )   +:  No aphasia Dysarthria (10. ): Mild-to-moderate dysarthria, patient slurs at least some words and, at worst, can be understood with some difficulty Extinction/Inattention (11.)   +: No Abnormality Modified SS Total  +: 1 Complete NIHSS TOTAL: 2     Neuro Assessment: Exceptions to WDL Neuro Checks:   Initial (01/29/21 1257)  Last Documented NIHSS Modified Score: 1 (01/29/21 1257)    R Recommendations: See Admitting Provider Note  Report given to:   Additional Notes:

## 2021-01-29 NOTE — Progress Notes (Signed)
Pt confused. Attempted to scan twice. First time pt was insistent that that she had to void. We told her she could do so. She was also attempting to take her mittens off. When putting ear plugs pt started moving her head. She continued insisting she had to void and was attempting to get off table. Second attempt, pt had calmed down a bit. Attempted to pt earplugs and anterior head coil all while pt resumed trying to take mittens off. She did not like having the anterior piece and started to try to remove it. RN aware of first attempt. No meds at this time.

## 2021-01-29 NOTE — H&P (Signed)
Date: 01/29/2021               Patient Name:  Ashley Chan MRN: [id number]  DOB: 1929/03/02 Age / Sex: 87 y.o., female   PCP: Monico Blitz, MD         Medical Service: Internal Medicine Teaching Service         Attending Physician: Dr. 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 Patient is currently somnolent from sedation needed to obtain MRI and story provided by son who is bedside.  Patient was in her normal state of health until Friday when her son and daughter noticed she was having some forgetfulness.  Saturday afternoon patient began to have weakness in her left arm and left leg.  She was having difficulty grasping things with her left hand and needed help standing. She was taken to [facility] and discharged from ED after CT and metabolic work-up was normal.  Patient was able to dress herself and seemed back to normal according to son Sunday  morning.  However Sunday evening patient was having hallucinations, described "seeing snakes and bugs ".  Patient was taken into [facility] and transferred to [facility] so MRI could be obtained.      ED Course: Unable to obtain MRI x2 attempts, patient was sedated with Geodon 50 mg.  Before this she was noted to be confused appearing, no hypoxia. Neurology consult place in ED.  Meds:  Current Meds  Medication Sig   amLODipine (NORVASC) 5 MG tablet Take 5 mg by mouth daily.   aspirin (ASPIRIN 81)  81 MG chewable tablet Chew 81 mg by mouth.   atorvastatin (LIPITOR) 10 MG tablet 1 tablet DAILY (route: oral)   famotidine (PEPCID) 20 MG tablet Take 20 mg by mouth 2 (two) times daily.   metoprolol succinate (TOPROL-XL) 100 MG 24 hr tablet Take 100 mg by mouth daily.   potassium chloride (K-DUR) 10 MEQ tablet Take 1 tablet (10 mEq total) by mouth daily.   [DISCONTINUED] atorvastatin (LIPITOR) 40 MG tablet Take 1 tablet (40 mg total) by mouth daily at 6 PM.    Past Medical History:  Diagnosis Date   Anxiety    Ataxia, post-stroke    Back pain    Chronic midline low back pain without sciatica    CVA (cerebral vascular accident) ([place]) 03/03/2017   Hypertension    Right hip pain     Past Surgical History:  Procedure Laterality Date   BACK SURGERY  2011   CATARACT EXTRACTION, BILATERAL  1999   COLONOSCOPY WITH PROPOFOL N/A 03/10/2017   Procedure: COLONOSCOPY WITH PROPOFOL;  Surgeon: Ladene Artist, MD;  Location: [facility] ENDOSCOPY;  Service: Endoscopy;  Laterality: N/A;   ESOPHAGOGASTRODUODENOSCOPY N/A 03/09/2017   Procedure: ESOPHAGOGASTRODUODENOSCOPY (EGD);  Surgeon: [name], MD;  Location[facility] END[facility];  Service: Endoscopy;  Laterality: N/A;    Social:  Lives With: daughter, performs her own ADLs. Patient's daughter assists  with IADLs. Occupation: Retired,  Support: Son and daughter, son is bedside today and lives with daughter. GYF:VCBS, Weldon Picking, MD, [facility] Substances: No EtOH, no tobacco use, no drug use  Family History: Family History  Problem Relation Age of Onset   Stroke Mother 39   Lung disease Father        "black lung"   Stroke Son      Allergies: Allergies as of 01/28/2021 - Review Complete 01/28/2021  Allergen Reaction Noted   Lidocaine  03/03/2017   Propranolol  03/03/2017   Sulfa antibiotics  03/03/2017    Review of Systems: Unable to obtain due to patient being sedated.   OBJECTIVE:   Physical Exam: Blood pressure (!) 146/65,  pulse (!) 55, temperature 98.6 F (37 C), temperature source Oral, resp. rate 13, height 5\' 6"  (1.676 m), weight 80.7 kg, SpO2 100 %.  Constitutional: Somnolent, well developed  HENT: normocephalic atraumatic, mucous membranes moist Eyes: conjunctiva non-erythematous, antiicteric sclera Cardiovascular: regular rate and rhythm, no m/r/g Pulmonary/Chest: normal work of breathing on room air, auscultated anteriorly, no adventitious breath sounds Abdominal: soft, non-tender, non-distended MSK: normal bulk and tone Neurological: Sedated, protecting airway but not responding to voice.  Skin: warm and dry Psych: unable to assess due to sedation.  Labs: CBC    Component Value Date/Time   WBC 5.5 01/29/2021 0105   RBC 4.04 01/29/2021 0105   HGB 12.8 01/29/2021 0105   HCT 38.4 01/29/2021 0105   PLT 207 01/29/2021 0105   MCV 95.0 01/29/2021 0105   MCH 31.7 01/29/2021 0105   MCHC 33.3 01/29/2021 0105   RDW 13.3 01/29/2021 0105   LYMPHSABS 2.3 01/29/2021 0105   MONOABS 0.6 01/29/2021 0105   EOSABS 0.2 01/29/2021 0105   BASOSABS 0.0 01/29/2021 0105     CMP     Component Value Date/Time   NA 141 01/29/2021 0105   K 4.5 01/29/2021 0105   CL 105 01/29/2021 0105   CO2 28 01/29/2021 0105   GLUCOSE 121 (H) 01/29/2021 0105   BUN 14 01/29/2021 0105   CREATININE 0.79 01/29/2021 0105   CALCIUM 9.3 01/29/2021 0105   PROT 6.1 (L) 03/11/2017 0522   ALBUMIN 3.1 (L) 03/11/2017 0522   AST 24 03/11/2017 0522   ALT 40 03/11/2017 0522   ALKPHOS 60 03/11/2017 0522   BILITOT 0.6 03/11/2017 0522   GFRNONAA >60 01/29/2021 0105   GFRAA >60 03/18/2017 0524    Imaging: CT Head Wo Contrast  Result Date: 01/29/2021 CLINICAL DATA:  Altered mental status EXAM: CT HEAD WITHOUT CONTRAST TECHNIQUE: Contiguous axial images were obtained from the base of the skull through the vertex without intravenous contrast. COMPARISON:  03/10/2017 FINDINGS: Brain: No evidence of acute infarction, hemorrhage,  hydrocephalus, extra-axial collection or mass lesion/mass effect. Subcortical white matter and periventricular small vessel ischemic changes. In 11 changes related to old left cerebellar infarct (series 2/image 10). Vascular: Intracranial atherosclerosis. Skull: Normal. Negative for fracture or focal lesion. Sinuses/Orbits: The visualized paranasal sinuses are essentially clear. The mastoid air cells are unopacified. Other: None. IMPRESSION: No evidence of acute intracranial abnormality. Old left cerebellar infarct. Small vessel ischemic changes. Electronically Signed   By: Julian Hy M.D.   On: 01/29/2021 01:52   MR BRAIN WO CONTRAST  Result Date: 01/29/2021 CLINICAL DATA:  87 year old female with altered mental status. EXAM: MRI HEAD WITHOUT CONTRAST TECHNIQUE: Multiplanar, multiecho pulse sequences of the brain and surrounding structures were obtained without intravenous contrast. COMPARISON:  Head CT 0114 hours  today. Report of [facility] Brain MRI 03/03/2017 (no images available). FINDINGS: Brain: Small cortical focus of restricted diffusion in the posterior right hemisphere at the lateral right inferior parietal lobe (series 2, image 27 and series 3, image 9). Associated mild T2 and FLAIR hyperintensity. No hemorrhage or mass effect. Left cerebellar encephalomalacia from previous hemorrhagic SCA infarct. Hemosiderin and gliosis. No other restricted diffusion. No midline shift, mass effect, evidence of mass lesion, ventriculomegaly, extra-axial collection or acute intracranial hemorrhage. Cervicomedullary junction and pituitary are within normal limits. Patchy and confluent bilateral cerebral white matter T2 and FLAIR hyperintensity. Deep gray matter nuclei and brainstem remain normal. No other convincing chronic cerebral blood products. Vascular: Major intracranial vascular flow voids are preserved with intracranial artery tortuosity and irregularity. Skull and upper cervical spine: Widespread cervical  spine degeneration including disc degeneration at C4-C5 resulting in at least mild cervical spinal stenosis on series 7, image 13. Visualized bone marrow signal is within normal limits. Sinuses/Orbits: Postoperative changes to both globes. Left maxillary sinus mucous retention cyst. Other: Mastoids are clear.  Negative visible scalp and face. IMPRESSION: 1. Very small cortical acute cortical infarct in the inferolateral right parietal lobe. No associated hemorrhage or mass effect. 2. Chronic hemorrhagic infarct of the left cerebellum in the SCA territory. 3. Superimposed advanced bilateral cerebral white matter signal changes, most commonly due to chronic small vessel disease. 4. Cervical spine degeneration with at least mild degenerative spinal stenosis suspected at C4-C5. Electronically Signed   By: Genevie Ann M.D.   On: 01/29/2021 09:08    EKG: personally reviewed my interpretation is Sinus with mild bradycardia, Baseline wanderer.  58/min PR 218 MS QRS 86 MS QT 475 normal axis.  - Personally Reviewed    ASSESSMENT & PLAN:    Assessment & Plan by Problem: Active Problems:   Stroke [facility])   Ashley Chan is a 87 y.o. with pertinent PMH of HTN, HLD, prior left cerebellar infarct with hemorrhagic conversion (mild difficulty with balance after rehabilitation per chart review)  who presented with hallucinations and admitted for acute stroke .  #Acute Ischemic Stroke - MRI revealed acute cortical infarct in the inferior lateral right parietal lobe, without hemorrhage or mass-effect.  Also chronic small vessel disease.  Chronic changes seen from previous stroke with hemorrhagic conversion, stable.  - Expect hallucinations are related to acute stroke giving timing and no signs of metabolic derangements or infection.  - Modifiable risk factors: HTN, HLD -Neurology consulted, appreciate their recommendations  - MRA Head and Neck - PT/INR - BP goal: permissive HTN up to 220/120 mmHg  - HBAIC and  Lipid profile - Telemetry monitoring - Frequent neuro checks - SLP/PT/OT - NPO until passes stroke swallow screen  Diet: NPO VTE: Enoxaparin IVF: None,None Code: Full  Prior to Admission Living Arrangement: Home, living with her daughter Anticipated Discharge Location: Home , spoke to son who would prefer home health PT/OT/SLP if needed Barriers to Discharge: Stroke workup  Dispo: Admit patient to Observation with expected length of stay less than 2 midnights.  Signed: Madalyn Rob, MD Internal Medicine Resident PGY-3 Pager: [phone]  01/29/2021, 10:16 AM

## 2021-01-29 NOTE — ED Provider Notes (Signed)
[place] EMERGENCY DEPARTMENT Provider Note   CSN: [id number] Arrival date & time: 01/28/21  2149     History Chief Complaint  Patient presents with   Hallucinations    Ashley Chan is a 87 y.o. female with a hx of diabetes, hypertension, prior cerebellar stroke presents to the Emergency Department as a transfer from [facility].  Per record, pt presented at [facility] for evaluation of weakness, confusion and AMS.  Per the daughter who provided additional history at [facility], pt started having hallucinations of snakes and things crawling up the walls approx 2 days ago.  Daughter reports that pt has been confused and has had difficulty holding things and feeding herself with her left arm.  Pt evaluated yesterday at [facility] and discharged.  Initial w/u at [facility] without acute findings, but given lack of MRI and neurologist, pt was transferred to [facility].    Level 5 caveat for AMS.  The history is provided by the patient and medical records. No language interpreter was used.      Past Medical History:  Diagnosis Date   Anxiety    Ataxia, post-stroke    Back pain    Chronic midline low back pain without sciatica    CVA (cerebral vascular accident) ([place]) 03/03/2017   Hypertension    Right hip pain     Patient Active Problem List   Diagnosis Date Noted   History of stroke 06/16/2018   Hyperlipidemia 12/03/2017   Ataxia due to recent stroke 08/07/2017   Bilateral primary osteoarthritis of knee 08/07/2017   Spinal stenosis in cervical region 03/14/2017   Cerebellar stroke, acute ([place]) 03/10/2017   Benign neoplasm of transverse colon    Colon ulcer    Neurologic gait disorder    Hypokalemia    Dizziness and giddiness    Coffee ground emesis    Heme + stool    Duodenitis    Benign essential HTN    Chronic midline low back pain without sciatica    Anxiety state    Ataxia, post-stroke    ICH (intracerebral hemorrhage) ([facility]) -  Left superior cerebellar hematoma  with increased vasogenic edema and mass effect on adjacent fourth ventricle without hydrocephalus 03/03/2017   CVA (cerebral vascular accident) ([place]) 03/03/2017    Past Surgical History:  Procedure Laterality Date   BACK SURGERY  2011   CATARACT EXTRACTION, BILATERAL  1999   COLONOSCOPY WITH PROPOFOL N/A 03/10/2017   Procedure: COLONOSCOPY WITH PROPOFOL;  Surgeon: Ladene Artist, MD;  Location: [facility] ENDOSCOPY;  Service: Endoscopy;  Laterality: N/A;   ESOPHAGOGASTRODUODENOSCOPY N/A 03/09/2017   Procedure: ESOPHAGOGASTRODUODENOSCOPY (EGD);  Surgeon: [name], MD;  Location: [facility] ENDOSCOPY;  Service: Endoscopy;  Laterality: N/A;     OB History   No obstetric history on file.     Family History  Problem Relation Age of Onset   Stroke Mother 83   Lung disease Father        "black lung"   Stroke Son     Social History   Tobacco Use   Smoking status: Former    Pack years: 0.00    Types: Cigarettes    Start date: 1949    Quit date: 1963    Years since quitting: 59.5   Smokeless tobacco: Never  Substance Use Topics   Alcohol use: No   Drug use: No    Home Medications Prior to Admission medications   Medication Sig Start Date End Date Taking? Authorizing Provider  amLODipine (NORVASC) 5 MG tablet Take 5 mg by mouth daily.   Yes [provider]  aspirin (ASPIRIN 81) 81 MG chewable tablet Chew 81 mg by mouth. 11/21/20  Yes [provider]  atorvastatin (LIPITOR) 10 MG tablet 1 tablet DAILY (route: oral) 10/05/20  Yes [provider]  famotidine (PEPCID) 20 MG tablet Take 20 mg by mouth 2 (two) times daily. 07/13/20  Yes [provider]  metoprolol succinate (TOPROL-XL) 100 MG 24 hr tablet Take 100 mg by mouth daily. 01/07/21  Yes [provider]  potassium chloride (K-DUR) 10 MEQ tablet Take 1 tablet (10 mEq total) by mouth daily. 03/20/17  Yes Angiulli, Lavon Paganini, PA-C  amLODipine (NORVASC) 10 MG tablet Take 1 tablet (10 mg total) by  mouth daily. 03/21/17   Angiulli, Lavon Paganini, PA-C  clonazePAM (KLONOPIN) 0.5 MG tablet Take 1 tablet (0.5 mg total) by mouth daily as needed for anxiety. 03/20/17   Angiulli, Lavon Paganini, PA-C  diclofenac sodium (VOLTAREN) 1 % GEL Apply 1 g topically 2 (two) times daily as needed (for pain or inflammation). TO AFFECTED AREAS 01/01/17   [provider]  fluticasone (FLONASE) 50 MCG/ACT nasal spray Place 2 sprays into both nostrils daily as needed for allergies or rhinitis.  02/25/17   [provider]  hydrochlorothiazide (HYDRODIURIL) 25 MG tablet Take 1 tablet (25 mg total) by mouth daily. 03/21/17   Angiulli, Lavon Paganini, PA-C  isosorbide mononitrate (IMDUR) 30 MG 24 hr tablet Take 30 mg by mouth daily. 01/01/21   [provider]  labetalol (NORMODYNE) 100 MG tablet Take 1 tablet (100 mg total) by mouth 2 (two) times daily. 03/20/17   Angiulli, Lavon Paganini, PA-C  pantoprazole (PROTONIX) 40 MG tablet Take 1 tablet (40 mg total) by mouth daily. 03/21/17   Angiulli, Lavon Paganini, PA-C  PROAIR HFA 108 (90 Base) MCG/ACT inhaler Inhale 1 puff into the lungs every 6 (six) hours as needed for shortness of breath or wheezing. 01/30/17   [provider]  UNABLE TO FIND Omega xl    [provider]  Vitamin D, Ergocalciferol, (DRISDOL) 50000 units CAPS capsule Take 50,000 Units by mouth every 7 (seven) days. 12/09/16   [provider]    Allergies    Lidocaine, Propranolol, and Sulfa antibiotics  Review of Systems   Review of Systems  Unable to perform ROS: Mental status change   Physical Exam Updated Vital Signs BP (!) 175/80 (BP Location: Left Arm)   Pulse 63   Temp 98.6 F (37 C) (Oral)   Resp 20   Ht 5\' 6"  (1.676 m)   Wt 80.7 kg   SpO2 99%   BMI 28.73 kg/m   Physical Exam Vitals and nursing note reviewed.  Constitutional:      General: She is not in acute distress.    Appearance: She is well-developed. She is not ill-appearing.  HENT:     Head:  Normocephalic.  Eyes:     General: No scleral icterus.    Conjunctiva/sclera: Conjunctivae normal.  Cardiovascular:     Rate and Rhythm: Normal rate.  Pulmonary:     Effort: Pulmonary effort is normal.  Abdominal:     General: There is no distension.  Musculoskeletal:        General: Normal range of motion.     Cervical back: Normal range of motion.  Skin:    General: Skin is warm and dry.  Neurological:     Mental Status: She is alert.  Comments: 5/5 strength in the BLE 4/5 strength in the Left UE.  Psychiatric:     Comments: Patient responding to internal stimuli; continues to talk about the ceiling tiles falling    ED Results / Procedures / Treatments   Labs (all labs ordered are listed, but only abnormal results are displayed) Labs Reviewed  BASIC METABOLIC PANEL - Abnormal; Notable for the following components:      Result Value   Glucose, Bld 121 (*)    All other components within normal limits  URINALYSIS, ROUTINE W REFLEX MICROSCOPIC - Abnormal; Notable for the following components:   Color, Urine COLORLESS (*)    Specific Gravity, Urine 1.003 (*)    Hgb urine dipstick SMALL (*)    Bacteria, UA RARE (*)    All other components within normal limits  RESP PANEL BY RT-PCR (FLU A&B, COVID) [id number]  CBC WITH DIFFERENTIAL/PLATELET    EKG EKG Interpretation  Date/Time:  Monday January 29 2021 00:59:04 EDT Ventricular Rate:  58 PR Interval:  218 QRS Duration: 86 QT Interval:  483 QTC Calculation: 475 R Axis:   50 Text Interpretation: Sinus rhythm Borderline prolonged PR interval Baseline wander in lead(s) II III aVR aVL aVF V2 V3 V4 V5 Confirmed by Veryl Speak [phone]) on 01/29/2021 1:20:16 AM  Radiology CT Head Wo Contrast  Result Date: 01/29/2021 CLINICAL DATA:  Altered mental status EXAM: CT HEAD WITHOUT CONTRAST TECHNIQUE: Contiguous axial images were obtained from the base of the skull through the vertex without intravenous contrast. COMPARISON:  03/10/2017  FINDINGS: Brain: No evidence of acute infarction, hemorrhage, hydrocephalus, extra-axial collection or mass lesion/mass effect. Subcortical white matter and periventricular small vessel ischemic changes. In 28 changes related to old left cerebellar infarct (series 2/image 10). Vascular: Intracranial atherosclerosis. Skull: Normal. Negative for fracture or focal lesion. Sinuses/Orbits: The visualized paranasal sinuses are essentially clear. The mastoid air cells are unopacified. Other: None. IMPRESSION: No evidence of acute intracranial abnormality. Old left cerebellar infarct. Small vessel ischemic changes. Electronically Signed   By: Julian Hy M.D.   On: 01/29/2021 01:52    Procedures Procedures   Medications Ordered in ED Medications  sodium chloride 0.9 % bolus 500 mL (0 mLs Intravenous Stopped 01/29/21 0141)  LORazepam (ATIVAN) injection 1 mg (1 mg Intravenous Given 01/29/21 0201)    ED Course  I have reviewed the triage vital signs and the nursing notes.  Pertinent labs & imaging results that were available during my care of the patient were reviewed by me and considered in my medical decision making (see chart for details).  Clinical Course as of 01/29/21 0610  Mon Jan 29, 2021  0506 BP(!): 175/80 HTN noted [HM]    Clinical Course User Index [HM] Abdulahad Mederos, Gwenlyn Perking   MDM Rules/Calculators/A&P                          Pt transferred from [facility] for AMS and confusion.  Pt will need MRI and Neurology eval.   6:10 AM Pt continues to have hallucinations.  Pending MRI.  At shift change care transferred to oncoming team.  They will await MRI and consult neurology.   Final Clinical Impression(s) / ED Diagnoses Final diagnoses:  Hallucinations  Altered mental status, unspecified altered mental status type    Rx / DC Orders ED Discharge Orders     None        Loni Muse Gwenlyn Perking 01/29/21 [id number]    Maudie Flakes,  MD 01/29/21 [id number]

## 2021-01-29 NOTE — ED Provider Notes (Signed)
87 yo female presenting with AMS, confusion, visual hallucinations x 3 days.  Son at bedside reports this is the same general presentation as her prior cerebellar stroke (2018), although from records it appears she had dizziness and nausea/vomiting at that time too.  He reports no new medications at home.  She is normally independent and lucid, no hx of dementia.  She takes aspirin 81 mg daily.  She was transferred from [facility] for an MRI brain at [facility].  Two attempts were made overnight for MRI but were unsuccessful due to agitation at MRI, despite ativan being given.  I've discussed the risks and benefits of further sedation with her son at bedside, versus hospital admission and possible general anesthesia for MRI.  He would prefer to attempt another MRI here in [place].  We can give 15 mg IM geodon 20 minutes prior to MRI (nursing instructed), and will need to monitor patient afterwards.  On my exam she's awake, in NAD, does appear confused, no hypoxia.  69 -MRI shows a new small parietal infarct.  Patient reassessed after her exam.  She is awake and not hypoxic breathing comfortably.  She does appear sedated.  Her son still at bedside and was updated.  I spoke to the internal medicine team and they will admit her.  Neurology will come see her in consult   Langston Masker Carola Rhine, MD 01/29/21 [phone]

## 2021-01-29 NOTE — ED Notes (Signed)
Patient transported to MRI 

## 2021-01-29 NOTE — ED Notes (Signed)
Attempted to give report to 3W x2; first time, NS stated the charge has not reviewed pt's chart yet. The second time, NS stated room is being cleaned.

## 2021-01-29 NOTE — Consult Note (Signed)
Neurology Consultation  Reason for Consult: stroke finding on MRI brain Referring Physician: M. Trifan, MD.  CC: confusion, left sided weakness, and visual hallucinations.  History is obtained from: son at bedside, chart.   HPI: Ashley Chan is a 87 y.o. 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 Son, reports that 3 days ago, patient was out shopping with her daughter and had difficulty remembering that she had just eaten. After returning home, patient was her normal self. The day after, family noted her normal in the a.m. and later in the day, she had left sided weakness with "dragging of the leg". She was also dropping things with her left hand. Patient is left handed and had difficulty feeding herself as she could not grip the fork. She also reported hallucinations of snakes and grape vines in home. Family took her to [facility] where she was discharged back home. Son says they did a CT there and everything was fine. Yesterday, patient woke up her normal self. Later, she became somewhat confused and was again seeing snakes. Her mental status would wax and wane. Her confusion and visual hallucinations were not constant.   Son states that patient never said anything about lights or colors, people that were not there, and no talking to anyone not present. Patient does not drink ETOH. Patient had lipoma removed from upper back 2 months ago.   Per chart review, patient last saw Dr. Leonie Man out patient 05/2018. Patient was on ASA 81mg  for secondary stroke prevention and a Statin. Her cardiac monitor did not reveal any tachy arrhythmias in 2019. No f/up was needed.   Because of stroke finding on MRI brain, neurology is asked to consult.   ROS: Unable to obtain robust ROS due to altered mental status.   Past Medical History:  Diagnosis Date   Anxiety    Ataxia, post-stroke    Back pain    Chronic midline low back pain without  sciatica    CVA (cerebral vascular accident) ([place]) 03/03/2017   Hypertension    Right hip pain     Family History  Problem Relation Age of Onset   Stroke Mother 70   Lung disease Father        "black lung"   Stroke Son    Social History:   reports that she quit smoking about 59 years ago. Her smoking use included cigarettes. She started smoking about 73 years ago. She has never used smokeless tobacco. She reports that she does not drink alcohol and does not use drugs.  Medications  Current Facility-Administered Medications:     stroke: mapping our early stages of recovery book, , Does not apply, Once, Madalyn Rob, MD   acetaminophen (TYLENOL) tablet 650 mg, 650 mg, Oral, Q4H PRN **OR** acetaminophen (TYLENOL) 160 MG/5ML solution 650 mg, 650 mg, Per Tube, Q4H PRN **OR** acetaminophen (TYLENOL) suppository 650 mg, 650 mg, Rectal, Q4H PRN, Madalyn Rob, MD   enoxaparin (LOVENOX) injection 40 mg, 40 mg, Subcutaneous, Q24H, Madalyn Rob, MD   senna-docusate (Senokot-S) tablet 1 tablet, 1 tablet, Oral, QHS PRN, Madalyn Rob, MD  Current Outpatient Medications:    amLODipine (NORVASC) 5 MG tablet, Take 5 mg by mouth daily., Disp: , Rfl:    aspirin (ASPIRIN 81) 81 MG chewable tablet, Chew 81 mg by mouth., Disp: , Rfl:    atorvastatin (LIPITOR) 10 MG tablet, 1 tablet DAILY (route: oral), Disp: , Rfl:    famotidine (  PEPCID) 20 MG tablet, Take 20 mg by mouth 2 (two) times daily., Disp: , Rfl:    metoprolol succinate (TOPROL-XL) 100 MG 24 hr tablet, Take 100 mg by mouth daily., Disp: , Rfl:    potassium chloride (K-DUR) 10 MEQ tablet, Take 1 tablet (10 mEq total) by mouth daily., Disp: 30 tablet, Rfl: 0   amLODipine (NORVASC) 10 MG tablet, Take 1 tablet (10 mg total) by mouth daily., Disp: 30 tablet, Rfl: 0   clonazePAM (KLONOPIN) 0.5 MG tablet, Take 1 tablet (0.5 mg total) by mouth daily as needed for anxiety., Disp: 20 tablet, Rfl: 0   diclofenac sodium (VOLTAREN) 1 % GEL, Apply 1 g topically 2  (two) times daily as needed (for pain or inflammation). TO AFFECTED AREAS, Disp: , Rfl: 2   fluticasone (FLONASE) 50 MCG/ACT nasal spray, Place 2 sprays into both nostrils daily as needed for allergies or rhinitis. , Disp: , Rfl: 3   hydrochlorothiazide (HYDRODIURIL) 25 MG tablet, Take 1 tablet (25 mg total) by mouth daily., Disp: 30 tablet, Rfl: 0   isosorbide mononitrate (IMDUR) 30 MG 24 hr tablet, Take 30 mg by mouth daily., Disp: , Rfl:    labetalol (NORMODYNE) 100 MG tablet, Take 1 tablet (100 mg total) by mouth 2 (two) times daily., Disp: 60 tablet, Rfl: 0   pantoprazole (PROTONIX) 40 MG tablet, Take 1 tablet (40 mg total) by mouth daily., Disp: 30 tablet, Rfl: 0   PROAIR HFA 108 (90 Base) MCG/ACT inhaler, Inhale 1 puff into the lungs every 6 (six) hours as needed for shortness of breath or wheezing., Disp: , Rfl: 3   UNABLE TO FIND, Omega xl, Disp: , Rfl:    Vitamin D, Ergocalciferol, (DRISDOL) 50000 units CAPS capsule, Take 50,000 Units by mouth every 7 (seven) days., Disp: , Rfl: 6   Exam: Current vital signs: BP (!) 146/65   Pulse (!) 55   Temp 98.6 F (37 C) (Oral)   Resp 13   Ht 5\' 6"  (1.676 m)   Wt 80.7 kg   SpO2 100%   BMI 28.73 kg/m  Vital signs in last 24 hours: Temp:  [98.4 F (36.9 C)-98.6 F (37 C)] 98.6 F (37 C) (07/04 0359) Pulse Rate:  [55-64] 55 (07/04 0815) Resp:  [13-22] 13 (07/04 0815) BP: (146-175)/(65-87) 146/65 (07/04 0815) SpO2:  [98 %-100 %] 100 % (07/04 0815) Weight:  [80.7 kg] 80.7 kg (07/03 2209)  PE: GENERAL: Lethargic, but responsive. Chronically ill appearing elderly female non toxic appearing. Easily aroused.   HEENT: normocephalic and atraumatic. LUNGS - Normal respiratory effort.  CV - RRR on tele. ABDOMEN - Soft, nontender. Ext: warm, well perfused. Psych: affect light.   NEURO:  Mental Status: Patient is drowsy and glassy eyed. Oriented to self. Disoriented to son, place, day, date, or year.  Speech/Language: speech is without  dysarthria or aphasia.  Naming, repetition, fluency, and comprehension intact.  Cranial Nerves:  II: PERRL  36mm/brisk. visual fields full. Left eye is slightly disconjugate, but could be sedation. Able cross midline and track NP with encouragement.  III, IV, VI: EOMI. Lid elevation symmetric and full.  V: sensation is intact and symmetrical to face.  VII: Smile is symmetrical.  VIII:hearing intact to voice. IX, X: palate elevation is symmetric. Phonation normal.  XI: normal sternocleidomastoid and trapezius muscle strength. EVO:JJKKXF is symmetrical without fasciculations.   Motor:  RUE: grips  5/5       triceps 5/5     biceps  5/5  LUE: grips  4/5      triceps  4+/5      biceps   4+/5 RLE:  knee  5/5    plantar flexion  5/5     dorsiflexion   5/5 LLE:  knee  4/5  plantar flexion   4-/5     dorsiflexion   4-/5 Tone is normal. Bulk is normal.  Sensation- Intact to light touch bilaterally in all four extremities. Extinction absent to light touch to DSS.  Coordination: FTN intact bilaterally. HKS can not perform. She is able to lift her RLE off bed but drifts and hits bed. Can barely lift LLE off bed.   DTRs:  UEs 1 brachioradialis     LEs  0 Gait- deferred.  NIHSS:  1a Level of Consciousness: 0 1b LOC Questions: 2 1c LOC Commands: 0 2 Best Gaze: 0 3 Visual: 0 4 Facial Palsy: 0 5a Motor Arm - left: 1 5b Motor Arm - Right: 0 6a Motor Leg - Left: 2 6b Motor Leg - Right: 2 7 Limb Ataxia: 1 8 Sensory: 0 9 Best Language: 0 10 Dysarthria: 0 11 Extinction and Inattention: 0 TOTAL: 8  Labs I have reviewed labs in epic and the results pertinent to this consultation are:  CBC    Component Value Date/Time   WBC 5.5 01/29/2021 0105   RBC 4.04 01/29/2021 0105   HGB 12.8 01/29/2021 0105   HCT 38.4 01/29/2021 0105   PLT 207 01/29/2021 0105   MCV 95.0 01/29/2021 0105   MCH 31.7 01/29/2021 0105   MCHC 33.3 01/29/2021 0105   RDW 13.3 01/29/2021 0105   LYMPHSABS 2.3 01/29/2021  0105   MONOABS 0.6 01/29/2021 0105   EOSABS 0.2 01/29/2021 0105   BASOSABS 0.0 01/29/2021 0105    CMP     Component Value Date/Time   NA 141 01/29/2021 0105   K 4.5 01/29/2021 0105   CL 105 01/29/2021 0105   CO2 28 01/29/2021 0105   GLUCOSE 121 (H) 01/29/2021 0105   BUN 14 01/29/2021 0105   CREATININE 0.79 01/29/2021 0105   CALCIUM 9.3 01/29/2021 0105   PROT 6.1 (L) 03/11/2017 0522   ALBUMIN 3.1 (L) 03/11/2017 0522   AST 24 03/11/2017 0522   ALT 40 03/11/2017 0522   ALKPHOS 60 03/11/2017 0522   BILITOT 0.6 03/11/2017 0522   GFRNONAA >60 01/29/2021 0105   GFRAA >60 03/18/2017 0524    Imaging MD reviewed the images obtained.  CT head No evidence of acute intracranial abnormality. Old left cerebellar infarct. Small vessel ischemic changes.  MRI brain -Very small cortical acute cortical infarct in the inferolateral right parietal lobe. No associated hemorrhage or mass effect. -Chronic hemorrhagic infarct of the left cerebellum in the SCA territory. -Superimposed advanced bilateral cerebral white matter signal changes, most commonly due to chronic small vessel disease. -Cervical spine degeneration with at least mild degenerative spinal stenosis suspected at C4-C5.   Assessment: 88 yo female with stroke risk factors of prior stroke, HTN, and DM. Presented with 3 day history of waxing and waning confusion and intermittent visual hallucinations, and left sided weakness. After multiple attempts with sedation, MRI brain showed stroke. Her confusion seems to have precluded the hallucinations. Her hallucinations are likely due to a delirium associated with the stroke or possible infectious or metabolic causes that have not been revealed as of yet. Given her ABCD2 score of 6, we we will Rx DAPT. Given found high grade stenosis of mid right P2 PCA, will  do DAPT x 90 days. Her stroke is small and the risk for bleeding is small.   Impression: -very small right cortical stroke in  inferolateral parietal lobe.  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  -history of stroke.   Recommendations/Plan:    -medicine admit.  -MRI brain, MRA head and neck completed.  -Plavix 75mg  po qd for now.  -ASA 81mg  po qd.  -check lipid panel. Increase Lipitor if LDL over 70.  -check HbA1c. Goal is < 7.  -permissive HTN up to 220/110 x 24 hours then normotensive.  -echocardiogram.  -telemetry to r/p arrhythmia.  -PT/OT/ST.  -NIHSS per [place] protocol.  -frequent neuro checks.  -risk factor modification.  -stroke education.  -infectious workup.   Pt seen by Clance Boll, NP/Neuro and later by MD. Note/plan to be edited by MD as needed.  Pager: [phone]    I have seen the patient and reviewed the above note.  I saw her slightly later, and so she was little bit more awake.  She had left-sided weakness on Saturday, which improved, but she has been having some hallucinations since last night.  It was the hallucinations that prompted the visit to the emergency room where an MRI was performed which shows a small distal infarct in the right parietal region.  I doubt that the stroke itself is responsible for her hallucinations, but it is possible that this precipitated a mild delirium, which I would expect to improve gradually over time.  Seizures associated with cortical stroke can happen, but given the nature of the hallucinations, I think delirium is more likely.  Roland Rack, MD [facility] [phone]  If 7pm- 7am, please page neurology on call as listed in [place].

## 2021-01-30 ENCOUNTER — Observation Stay (HOSPITAL_COMMUNITY): Payer: Medicare Other

## 2021-01-30 ENCOUNTER — Inpatient Hospital Stay (HOSPITAL_COMMUNITY): Payer: Medicare Other

## 2021-01-30 DIAGNOSIS — Z882 Allergy status to sulfonamides status: Secondary | ICD-10-CM | POA: Diagnosis not present

## 2021-01-30 DIAGNOSIS — N390 Urinary tract infection, site not specified: Secondary | ICD-10-CM | POA: Diagnosis present

## 2021-01-30 DIAGNOSIS — R443 Hallucinations, unspecified: Secondary | ICD-10-CM | POA: Diagnosis present

## 2021-01-30 DIAGNOSIS — E875 Hyperkalemia: Secondary | ICD-10-CM | POA: Diagnosis present

## 2021-01-30 DIAGNOSIS — K219 Gastro-esophageal reflux disease without esophagitis: Secondary | ICD-10-CM | POA: Diagnosis present

## 2021-01-30 DIAGNOSIS — E785 Hyperlipidemia, unspecified: Secondary | ICD-10-CM | POA: Diagnosis present

## 2021-01-30 DIAGNOSIS — I69354 Hemiplegia and hemiparesis following cerebral infarction affecting left non-dominant side: Secondary | ICD-10-CM | POA: Diagnosis not present

## 2021-01-30 DIAGNOSIS — F05 Delirium due to known physiological condition: Secondary | ICD-10-CM | POA: Diagnosis present

## 2021-01-30 DIAGNOSIS — R441 Visual hallucinations: Secondary | ICD-10-CM | POA: Diagnosis present

## 2021-01-30 DIAGNOSIS — F419 Anxiety disorder, unspecified: Secondary | ICD-10-CM | POA: Diagnosis present

## 2021-01-30 DIAGNOSIS — R4182 Altered mental status, unspecified: Secondary | ICD-10-CM | POA: Diagnosis not present

## 2021-01-30 DIAGNOSIS — Z91041 Radiographic dye allergy status: Secondary | ICD-10-CM | POA: Diagnosis not present

## 2021-01-30 DIAGNOSIS — I6300 Cerebral infarction due to thrombosis of unspecified precerebral artery: Secondary | ICD-10-CM | POA: Diagnosis not present

## 2021-01-30 DIAGNOSIS — I6389 Other cerebral infarction: Secondary | ICD-10-CM | POA: Diagnosis not present

## 2021-01-30 DIAGNOSIS — I10 Essential (primary) hypertension: Secondary | ICD-10-CM | POA: Diagnosis present

## 2021-01-30 DIAGNOSIS — R29702 NIHSS score 2: Secondary | ICD-10-CM | POA: Diagnosis present

## 2021-01-30 DIAGNOSIS — G8194 Hemiplegia, unspecified affecting left nondominant side: Secondary | ICD-10-CM | POA: Diagnosis present

## 2021-01-30 DIAGNOSIS — Z8249 Family history of ischemic heart disease and other diseases of the circulatory system: Secondary | ICD-10-CM | POA: Diagnosis not present

## 2021-01-30 DIAGNOSIS — Z20822 Contact with and (suspected) exposure to covid-19: Secondary | ICD-10-CM | POA: Diagnosis present

## 2021-01-30 DIAGNOSIS — I4892 Unspecified atrial flutter: Secondary | ICD-10-CM | POA: Diagnosis present

## 2021-01-30 DIAGNOSIS — Z888 Allergy status to other drugs, medicaments and biological substances status: Secondary | ICD-10-CM | POA: Diagnosis not present

## 2021-01-30 DIAGNOSIS — N3001 Acute cystitis with hematuria: Secondary | ICD-10-CM

## 2021-01-30 DIAGNOSIS — E876 Hypokalemia: Secondary | ICD-10-CM | POA: Diagnosis present

## 2021-01-30 DIAGNOSIS — E119 Type 2 diabetes mellitus without complications: Secondary | ICD-10-CM | POA: Diagnosis present

## 2021-01-30 DIAGNOSIS — I4891 Unspecified atrial fibrillation: Secondary | ICD-10-CM | POA: Diagnosis present

## 2021-01-30 DIAGNOSIS — R451 Restlessness and agitation: Secondary | ICD-10-CM | POA: Diagnosis present

## 2021-01-30 DIAGNOSIS — I6340 Cerebral infarction due to embolism of unspecified cerebral artery: Secondary | ICD-10-CM | POA: Diagnosis present

## 2021-01-30 DIAGNOSIS — I639 Cerebral infarction, unspecified: Secondary | ICD-10-CM | POA: Diagnosis not present

## 2021-01-30 DIAGNOSIS — F0390 Unspecified dementia without behavioral disturbance: Secondary | ICD-10-CM | POA: Diagnosis present

## 2021-01-30 DIAGNOSIS — Z833 Family history of diabetes mellitus: Secondary | ICD-10-CM | POA: Diagnosis not present

## 2021-01-30 LAB — CBC
HCT: 45.6 % (ref 36.0–46.0)
Hemoglobin: 15.5 g/dL — ABNORMAL HIGH (ref 12.0–15.0)
MCH: 31.3 pg (ref 26.0–34.0)
MCHC: 34 g/dL (ref 30.0–36.0)
MCV: 92.1 fL (ref 80.0–100.0)
Platelets: 241 10*3/uL (ref 150–400)
RBC: 4.95 MIL/uL (ref 3.87–5.11)
RDW: 13.1 % (ref 11.5–15.5)
WBC: 6 10*3/uL (ref 4.0–10.5)
nRBC: 0 % (ref 0.0–0.2)

## 2021-01-30 LAB — COMPREHENSIVE METABOLIC PANEL
ALT: 19 U/L (ref 0–44)
AST: 22 U/L (ref 15–41)
Albumin: 3.8 g/dL (ref 3.5–5.0)
Alkaline Phosphatase: 73 U/L (ref 38–126)
Anion gap: 10 (ref 5–15)
BUN: 8 mg/dL (ref 8–23)
CO2: 25 mmol/L (ref 22–32)
Calcium: 9.3 mg/dL (ref 8.9–10.3)
Chloride: 103 mmol/L (ref 98–111)
Creatinine, Ser: 0.73 mg/dL (ref 0.44–1.00)
GFR, Estimated: >60 mL/min (ref >60)
Glucose, Bld: 121 mg/dL — ABNORMAL HIGH (ref 70–99)
Potassium: 3.2 mmol/L — ABNORMAL LOW (ref 3.5–5.1)
Sodium: 138 mmol/L (ref 135–145)
Total Bilirubin: 0.9 mg/dL (ref 0.3–1.2)
Total Protein: 6.8 g/dL (ref 6.5–8.1)

## 2021-01-30 LAB — ECHOCARDIOGRAM COMPLETE
AR max vel: 2.39 cm2
AV Area VTI: 2.01 cm2
AV Area mean vel: 2.24 cm2
AV Mean grad: 5 mmHg
AV Peak grad: 8 mmHg
Ao pk vel: 1.41 m/s
Area-P 1/2: 5.54 cm2
Height: 66 in
S' Lateral: 1.5 cm
Weight: 2848 oz

## 2021-01-30 LAB — LIPID PANEL
Cholesterol: 131 mg/dL (ref 0–200)
HDL: 41 mg/dL (ref >40)
LDL Cholesterol: 63 mg/dL (ref 0–99)
Total CHOL/HDL Ratio: 3.2 RATIO
Triglycerides: 133 mg/dL (ref <150)
VLDL: 27 mg/dL (ref 0–40)

## 2021-01-30 LAB — HEMOGLOBIN A1C
Hgb A1c MFr Bld: 6.3 % — ABNORMAL HIGH (ref 4.8–5.6)
Mean Plasma Glucose: 134.11 mg/dL

## 2021-01-30 MED ORDER — AMLODIPINE BESYLATE 5 MG PO TABS
5.0000 mg | ORAL_TABLET | Freq: Every day | ORAL | Status: DC
  Administered 2021-01-30 – 2021-01-31 (×2): 5 mg via ORAL
  Filled 2021-01-30 (×2): qty 1

## 2021-01-30 MED ORDER — ATORVASTATIN CALCIUM 10 MG PO TABS
10.0000 mg | ORAL_TABLET | Freq: Every day | ORAL | Status: DC
  Administered 2021-01-30 – 2021-01-31 (×2): 10 mg via ORAL
  Filled 2021-01-30 (×2): qty 1

## 2021-01-30 MED ORDER — METOPROLOL SUCCINATE ER 25 MG PO TB24
25.0000 mg | ORAL_TABLET | Freq: Every day | ORAL | Status: DC
  Administered 2021-01-30: 25 mg via ORAL
  Filled 2021-01-30: qty 1

## 2021-01-30 MED ORDER — POTASSIUM CHLORIDE CRYS ER 20 MEQ PO TBCR
30.0000 meq | EXTENDED_RELEASE_TABLET | Freq: Two times a day (BID) | ORAL | Status: AC
  Administered 2021-01-30: 30 meq via ORAL
  Filled 2021-01-30 (×2): qty 1

## 2021-01-30 MED ORDER — AZITHROMYCIN 500 MG PO TABS
500.0000 mg | ORAL_TABLET | Freq: Every day | ORAL | Status: DC
  Administered 2021-01-30 – 2021-01-31 (×2): 500 mg via ORAL
  Filled 2021-01-30 (×2): qty 1

## 2021-01-30 MED ORDER — FAMOTIDINE 20 MG PO TABS
20.0000 mg | ORAL_TABLET | Freq: Two times a day (BID) | ORAL | Status: DC
  Administered 2021-01-30 – 2021-01-31 (×4): 20 mg via ORAL
  Filled 2021-01-30 (×4): qty 1

## 2021-01-30 MED ORDER — METOPROLOL SUCCINATE ER 100 MG PO TB24: 100.0000 mg | ORAL_TABLET | Freq: Every day | ORAL | Status: DC

## 2021-01-30 MED ORDER — ALBUTEROL SULFATE (2.5 MG/3ML) 0.083% IN NEBU
2.5000 mg | INHALATION_SOLUTION | Freq: Once | RESPIRATORY_TRACT | Status: AC
  Administered 2021-01-30: 2.5 mg via RESPIRATORY_TRACT
  Filled 2021-01-30: qty 3

## 2021-01-30 MED ORDER — SODIUM CHLORIDE 0.9 % IV SOLN
1.0000 g | INTRAVENOUS | Status: DC
  Administered 2021-01-30 – 2021-01-31 (×2): 1 g via INTRAVENOUS
  Filled 2021-01-30 (×2): qty 10

## 2021-01-30 NOTE — Progress Notes (Signed)
OT Cancellation Note  Patient Details Name: Ashley Chan MRN: [id number] DOB: 09-16-1928   Cancelled Treatment:    Reason Eval/Treat Not Completed: Active bedrest order. Will return as medically appropriate.   Shanda Howells, OTDS   Shanda Howells 01/30/2021, 7:25 AM

## 2021-01-30 NOTE — Progress Notes (Addendum)
STROKE TEAM PROGRESS NOTE   SUBJECTIVE (INTERVAL HISTORY) Ashley Chan has had no acute events since the last neurology visit. Her son and daughter-in law are at the bedside.  MRI scan shows a small right posterior parietal punctate infarct.  MRA of the neck is nondiagnostic due to motion artifacts and MRI of the brain shows right P2 segment posterior cerebral artery stenosis.  OBJECTIVE Vitals:   01/30/21 0400 01/30/21 0853 01/30/21 1010 01/30/21 1208  BP: (!) 174/78 (!) 177/77  136/75  Pulse: 93 78 78 (!) 104  Resp:  16 16 15   Temp: 97.9 F (36.6 C) 97.9 F (36.6 C)  98.1 F (36.7 C)  TempSrc: Oral Oral  Oral  SpO2: 98% 98%  100%  Weight:      Height:        CBC:  Recent Labs  Lab 01/29/21 0105 01/30/21 0238  WBC 5.5 6.0  NEUTROABS 2.3  --   HGB 12.8 15.5*  HCT 38.4 45.6  MCV 95.0 92.1  PLT 207 099    Basic Metabolic Panel:  Recent Labs  Lab 01/29/21 0105 01/30/21 0238  NA 141 138  K 4.5 3.2*  CL 105 103  CO2 28 25  GLUCOSE 121* 121*  BUN 14 8  CREATININE 0.79 0.73  CALCIUM 9.3 9.3    Lipid Panel:  Recent Labs  Lab 01/30/21 0238  CHOL 131  TRIG 133  HDL 41  CHOLHDL 3.2  VLDL 27  LDLCALC 63   HgbA1c:  Lab Results  Component Value Date   HGBA1C 6.3 (H) 01/30/2021   Urine Drug Screen: No results found for: LABOPIA, COCAINSCRNUR, LABBENZ, AMPHETMU, THCU, LABBARB  Alcohol Level No results found for: [facility]  IMAGING  Results for orders placed or performed during the hospital encounter of 01/28/21  MR BRAIN WO CONTRAST   Narrative   CLINICAL DATA:  87 year old female with altered mental status.  EXAM: MRI HEAD WITHOUT CONTRAST  TECHNIQUE: Multiplanar, multiecho pulse sequences of the brain and surrounding structures were obtained without intravenous contrast.  COMPARISON:  Head CT 0114 hours today. Report of [facility] MRI 03/03/2017 (no images available).  FINDINGS: Brain: Small cortical focus of restricted diffusion in the posterior right  hemisphere at the lateral right inferior parietal lobe (series 2, image 27 and series 3, image 9). Associated mild T2 and FLAIR hyperintensity. No hemorrhage or mass effect.  Left cerebellar encephalomalacia from previous hemorrhagic SCA infarct. Hemosiderin and gliosis.  No other restricted diffusion. No midline shift, mass effect, evidence of mass lesion, ventriculomegaly, extra-axial collection or acute intracranial hemorrhage. Cervicomedullary junction and pituitary are within normal limits.  Patchy and confluent bilateral cerebral white matter T2 and FLAIR hyperintensity. Deep gray matter nuclei and brainstem remain normal. No other convincing chronic cerebral blood products.  Vascular: Major intracranial vascular flow voids are preserved with intracranial artery tortuosity and irregularity.  Skull and upper cervical spine: Widespread cervical spine degeneration including disc degeneration at C4-C5 resulting in at least mild cervical spinal stenosis on series 7, image 13. Visualized bone marrow signal is within normal limits.  Sinuses/Orbits: Postoperative changes to both globes. Left maxillary sinus mucous retention cyst.  Other: Mastoids are clear.  Negative visible scalp and face.  IMPRESSION: 1.  No associated hemorrhage or mass effect. 2. Chronic hemorrhagic infarct of the left cerebellum in the SCA territory. 3. Superimposed advanced bilateral cerebral white matter signal changes, most commonly due to chronic small vessel disease. 4. Cervical spine degeneration with at least mild degenerative  spinal stenosis suspected at C4-C5.   Electronically Signed   By: Genevie Ann M.D.   On: 01/29/2021 09:08   MR ANGIO HEAD WO CONTRAST   Narrative   CLINICAL DATA:  Small acute stroke on MRI brain  EXAM: MRA HEAD WITHOUT CONTRAST  MRA NECK WITHOUT CONTRAST  TECHNIQUE: Angiographic images of the Circle of Willis were obtained using MRA technique without intravenous  contrast. Angiographic images of the neck were obtained using MRA technique without intravenous contrast. Carotid stenosis measurements (when applicable) are obtained utilizing NASCET criteria, using the distal internal carotid diameter as the denominator.  COMPARISON:  None.  FINDINGS: MRA HEAD  Motion artifact is present.  Intracranial internal carotid arteries are patent with atherosclerotic irregularity. Moderate stenosis of the paraclinoid left ICA. Middle and anterior cerebral arteries are patent. Dominant right A1 ACA and congenitally absent left A1 ACA. Anterior communicating artery is present. Intracranial vertebral arteries, basilar artery, posterior cerebral arteries are patent. There is focal high-grade stenosis of the mid right P2 PCA. Right communicating arteries present. No aneurysm.  MRA NECK  Significant motion artifact is present. Visualized portions of the carotids and vertebral arteries are patent. Stenosis cannot be evaluated.  IMPRESSION: No proximal intracranial vessel occlusion. Moderate stenosis of the paraclinoid left ICA. Focal high-grade stenosis mid right P2 PCA.  MRA neck is significantly degraded and essentially nondiagnostic or stenosis evaluation.   Electronically Signed   By: Macy Mis M.D.   On: 01/29/2021 13:28   CT Head Wo Contrast   Narrative   CLINICAL DATA:  Altered mental status  EXAM: CT HEAD WITHOUT CONTRAST  TECHNIQUE: Contiguous axial images were obtained from the base of the skull through the vertex without intravenous contrast.  COMPARISON:  03/10/2017  FINDINGS: Brain: No evidence of acute infarction, hemorrhage, hydrocephalus, extra-axial collection or mass lesion/mass effect.  Subcortical white matter and periventricular small vessel ischemic changes. In 101 changes related to old left cerebellar infarct (series 2/image 10).  Vascular: Intracranial atherosclerosis.  Skull: Normal. Negative for  fracture or focal lesion.  Sinuses/Orbits: The visualized paranasal sinuses are essentially clear. The mastoid air cells are unopacified.  Other: None.  IMPRESSION: No evidence of acute intracranial abnormality. Old left cerebellar infarct. Small vessel ischemic changes.   Electronically Signed   By: Julian Hy M.D.   On: 01/29/2021 01:52     PHYSICAL EXAM  General exam   HEENT-  Normocephalic, no lesions, without obvious abnormality.  Normal external eye and conjunctiva.   Cardiovascular- RRR on telemetry Lungs-Breathing comfortably on room air Abdomen-Soft Musculoskeletal-no joint tenderness, deformity or swelling Skin-warm and dry, well perfused  Neurologic Exam: General: NAD Mental Status: Alert, oriented to self, family around her, believes she is home, knows it's a Tuesday in July but unknown year. Daughter-in-law states these types of responses are baseline for her. Speech fluent without evidence of aphasia.  Diminished attention, registration and recall. Cranial Nerves: II:  Visual fields grossly normal, PERRL III,IV, VI: ptosis not present, extra-ocular motions intact bilaterally V,VII: Face smile symmetric VIII: Hearing intact to loud voice Motor: Moves limbs spontaneously x 4. Tone and bulk appropriate for age. Sensory: Light touch intact throughout, bilaterally Gait: Deferred   ASSESSMENT/PLAN Ashley Chan is a 87 y.o. 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 Imaging on arrival showed chronic white  matter disease, the old left cerebellar infarct and a very small acute cortical infarct in the inferolateral right parietal lobe.   # Stroke: Acute cortical infarct in the inferolateral right parietal lobe Following her  stroke in 2018, Ms. Bosak was on 81mg  ASA and a statin. Her son notes that the medication is controlled by his sister/Ms. Blye's daughter. Uncontrolled risk factors for stroke are limited to advanced age and family history; DM II and HTN are well controlled, LDL 63. It is possible that Ms. Persichetti has undiagnosed atrial fibrillation. For diagnosis a loop recorder would be best; however, considering AshleyGoar's advanced age, history and capacity for meaningful recovery, the family must decide whether or not to move forward with the procedure. A full discussion on the family's choices regarding anticoagulation (DAPT vs DOAC) was conducted and all questions were answered. It was requested that they decide by tomorrow so we can appropriately continue care. Loop recorder - decision by family pending 2D Echo  pending LDL 63, under goal of 70 HgbA1c 6.3, under of goal 7.0 Ongoing aggressive stroke risk factor management PT/OT/SLP  Visual Hallucinations Resolved by my visit. Dr. Leonel Ramsay feels that the stroke make have precipitated a mild delirium which has since resolved. Will continue to monitor.  Hypertension Most recently 136/75 but 140s-190s/70-100s earlier Continue norvasc 5mg  daily Permissive hypertension (OK if < 220/120) but gradually normalize in 5-7 days Long-term BP goal normotensive  Hyperlipidemia Continue 10mg  lipitor daily LDL 63, goal < 70 Continue statin at discharge  Hospital day # 0  Solon Augusta, PhD, PA-C Stroke [phone]  I have personally obtained history,examined this patient, reviewed notes, independently viewed imaging studies, participated in medical decision making and plan of care.ROS completed by me personally and pertinent positives fully documented  I have made any additions or clarifications directly to the above note. Agree with note above.  Patient presented with increasing confusion and hallucinations and transient left hand weakness which has  resolved.  MRI shows a small punctate right parietal infarct likely of embolic etiology.  Given patient's baseline dementia not sure if she is a good long-term anticoagulation candidate.  I discussed risk benefit with the patient's son and daughter-in-law and family is to decide if they want [place] to be aggressive in place a loop recorder to look for paroxysmal A. fib.  Continue ongoing stroke work-up.  Greater than 50% time during this 35-minute visit were spent in counseling and coordination of care for stroke discussion about evaluation and treatment plan and answering questions with family and care team.  Antony Contras, MD Medical Director [place] Pager: [phone] 01/30/2021 5:26 PM  To contact Stroke Continuity provider, please refer to [email or web address] After hours, contact General Neurology

## 2021-01-30 NOTE — Progress Notes (Signed)
Pt w/ BP 192/78  asymptomatic w/o prn coverage. [facility] (IMTS) informed.

## 2021-01-30 NOTE — Evaluation (Signed)
SLP Cancellation Note  Patient Details Name: Ashley Chan MRN: [id number] DOB: 1928/09/26   Cancelled treatment:       Reason Eval/Treat Not Completed: Other (comment) (pt currently with RN and family receiving medications, will continue efforts)   Macario Golds 01/30/2021, 10:39 AM   Kathleen Lime, MS [place] Office [phone] Pager [phone]

## 2021-01-30 NOTE — Progress Notes (Signed)
EEG completed, results pending. 

## 2021-01-30 NOTE — Plan of Care (Signed)

## 2021-01-30 NOTE — Progress Notes (Signed)
PT Cancellation Note  Patient Details Name: Ashley Chan MRN: [id number] DOB: 1928-12-21   Cancelled Treatment:    Reason Eval/Treat Not Completed: Active bedrest order. PT will continue to f/u with pt acutely and await updated activity orders prior to initiating evaluation.    [place] 01/30/2021, 7:44 AM

## 2021-01-30 NOTE — Progress Notes (Signed)
Subjective:  No acute events or concerns overnight  Patient examined this morning with patient lying in bed. Patient was sleeping at start of encounter and woke to voice and gentle physical stimulus. Patient was vocalizing without verbalizing with the exception of the statement "he is after our souls". Patient could follow simple commands with prompting. Patient would not verbalize if she was in pain or how she was feeling.  Objective:  Vital signs in last 24 hours: Vitals:   01/29/21 2038 01/29/21 2040 01/29/21 2337 01/30/21 0400  BP: (!) 187/98 (!) 186/86 (!) 192/78 (!) 174/78  Pulse: (!) 111 86 76 93  Resp: 20 15 18    Temp: 98.6 F (37 C) 98.5 F (36.9 C) 98.1 F (36.7 C) 97.9 F (36.6 C)  TempSrc: Oral Oral Oral Oral  SpO2: 99% 99% 100% 98%  Weight:      Height:        Intake/Output Summary (Last 24 hours) at 01/30/2021 6720 Last data filed at 01/30/2021 9470 Gross per 24 hour  Intake 120 ml  Output 850 ml  Net -730 ml   Physical Exam: General: NAD, elderly african [place] female, somnolent HENT: normocephalic, atraumatic, MMM Eyes:conjunctiva non-erythematous, no scleral icterus CV: regular rate and rhythm, no murmurs, rubs, gallops Pulmonary: traces wheezes L lung, otherwise clear to auscultation Abdominal: bowel sounds present, non distended, soft, no signs of acute abdomen Skin: no abrasions, ulcers, lesions, rashes or signs of infection Neuro:  Mental Status: somnolent, awoke to physical and verb stimulus, could not answer orientation questions, follows simple commands with prompting raise your arm, open your mouth Speech: single incoherent words/phrases, "he's after our souls" was the only intelligible sentence Cns: CN 2: PERRL CN346: eyes track examiner in all cardinal directions CN7: face appears symmetric CN12: tongue midline Motor: Patient moves all 4 extremities independently antigravity  Reflexes: brachial and brachioradialis 2+ Psych: flat  affect  CBC Latest Ref Rng & Units 01/30/2021 01/29/2021 03/18/2017  WBC 4.0 - 10.5 K/uL 6.0 5.5 5.9  Hemoglobin 12.0 - 15.0 g/dL 15.5(H) 12.8 11.9(L)  Hematocrit 36.0 - 46.0 % 45.6 38.4 35.7(L)  Platelets 150 - 400 K/uL 241 207 392   BMP Latest Ref Rng & Units 01/30/2021 01/29/2021 03/18/2017  Glucose 70 - 99 mg/dL 121(H) 121(H) 131(H)  BUN 8 - 23 mg/dL 8 14 17   Creatinine 0.44 - 1.00 mg/dL 0.73 0.79 0.89  Sodium 135 - 145 mmol/L 138 141 139  Potassium 3.5 - 5.1 mmol/L 3.2(L) 4.5 3.8  Chloride 98 - 111 mmol/L 103 105 102  CO2 22 - 32 mmol/L 25 28 29   Calcium 8.9 - 10.3 mg/dL 9.3 9.3 9.7    CT HEAD WITHOUT CONTRAST 01/29/2021 IMPRESSION: No evidence of acute intracranial abnormality. Old left cerebellar infarct. Small vessel ischemic changes. By: Julian Hy M.D.  MRI HEAD WITHOUT CONTRAST 7/4 IMPRESSION: 1. Very small cortical acute cortical infarct in the inferolateral right parietal lobe. No associated hemorrhage or mass effect. 2. Chronic hemorrhagic infarct of the left cerebellum in the SCA territory. 3. Superimposed advanced bilateral cerebral white matter signal changes, most commonly due to chronic small vessel disease. 4. Cervical spine degeneration with at least mild degenerative spinal stenosis suspected at C4-C5.  ByGenevie Ann M.D.  MRA HEAD WITHOUT CONTRAST 7/4: IMPRESSION: No proximal intracranial vessel occlusion. Moderate stenosis of the paraclinoid left ICA. Focal high-grade stenosis mid right P2 PCA. MRA neck is significantly degraded and essentially nondiagnostic or stenosis evaluation By: Addison Lank.D.  EKG 7/4 Sinus rhythm Borderline prolonged PR interval Baseline wander in lead(s) II III aVR aVL aVF V2 V3 V4 V5 Confirmed by Veryl Speak  Assessment/Plan: 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 MRI Brain showed new acute cortical infarct without hemorrhagic conversion or mass effect. She has not returned to her neurologic baseline and remains confused.   Active Problems:   Stroke ([place])  #Acute Cortical Infarct etiology small vessel disease #AMS in the setting of acute stroke vs due to underlying infectious or metabolic process #hx CVA L cerebellar infarct Patient presented with confusion/LHB weakness/ hallucinations. MRI Brain revealed acute cortical infarct in the inferior lateral right parietal lobe, without hemorrhage or mass-effect, as well as chronic small vessel disease. Chronic changes seen from previous stroke with hemorrhagic conversion were stable. MRA Head and Neck showed high grade stenosis of mid right P2 PCA. Patient remains confused and somnolent. Hallucinations are likely related to delirium associated with the stroke or possible infectious or metabolic causes. ABCD2 score of 6. Patient continues to have AMS in the setting of acute stroke and recent sedation for MRI. Ammonia 28 normal. Resp panel negative, UA with rare bacteria. -Neurology consulted, appreciate their recommendations  -DAPT aspirin 81mg  plavix 75mg  for 90 days (start date 7/4) -BP goal normotensive, patient with mild tachycardia, will restart metoprolol 25mg  daily and amlodipine 5mg  daily and continue to titrate BP meds as needed -Stroke stratification labs: Hgba1c 6.3% LDL 63 -Telemetry, patient remains in sinus tachycardia -INR 0.9 -Frequent neuro checks -EEG to rule out subclinical seizures -Urine culture and empiric ABX ordered for possible UTI contributing to AMS -CXR to assess for other source of infection -PT/OT/SLP -Patient passed swallow screen, on regular diet   #Possible UTI with rare bacteria on UA in an elderly patient with AMS UA with rare bacteria. Patient is altered and cannot obtain information about UTI  symptoms. Patient is tachycardic without fever, without leukocytosis. -empiric ABX ceftriaxone 1g daily (start date 7/5) -urine culture ordered  #Hypertension -Patient has been on amlodipine, HCTZ, Labetalol, metoprolol succinate, imdur in the past for HTN. Permissive HTN allowed during first 24hrs following acute stroke. BP goal is normotensive. -restarted metoprolol succinate 25mg  daily -restarted amlodipine 5mg   #HLD Lipid panel at admission with LDL 63 with is at goal (  -restarted patient on home atorvastatin 10mg  daily  #GERD -restarted home pepcid 20mg  BID  #Wheezes on pulmonary exam in the setting of AMS Patient has prescription for proair [facility] inhaler. Given AMS we will work up for infectious sources -CXR  -albuterol nebulizer once  Diet: regular, thin liquids DVT ppx: Lovenox    Prior to Admission Living Arrangement: Home requiring assistance from family for iADLs Anticipated Discharge Location: Home with home health therapies Barriers to Discharge: Patient has not returned to neurologic baseline, ongoing workup for infectious sources Dispo: Anticipated discharge in approximately 2-3 day(s).   Wayland Denis, MD 01/30/2021, 6:49 AM Pager: [phone] After 5pm on weekdays and 1pm on weekends: On Call pager [phone]

## 2021-01-30 NOTE — Hospital Course (Signed)
Ms. 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 MRI Brain showed new acute cortical infarct without hemorrhagic conversion or mass effect. MRA Head and Neck showed high grade stenosis of mid right P2 PCA. ABCD2 score of 6. Ammonia 28 normal. Resp panel negative, UA with rare bacteria.

## 2021-01-30 NOTE — Procedures (Signed)
Patient Name: Ashley Chan  MRN: [id number]  Epilepsy Attending: Lora Havens  Referring Physician/Provider: Dr. Kathrynn Speed Date: 01/30/2021 Duration: 24.57 mins  Patient history: 87 year old female with waxing and waning confusion and intermittent visual hallucinations as well as left-sided weakness.  EEG to evaluate for seizures.  Level of alertness: Awake  AEDs during EEG study: None  Technical aspects: This EEG study was done with scalp electrodes positioned according to the 10-20 International system of electrode placement. Electrical activity was acquired at a sampling rate of 500Hz  and reviewed with a high frequency filter of 70Hz  and a low frequency filter of 1Hz . EEG data were recorded continuously and digitally stored.   Description: No posterior dominant rhythm was seen. EEG showed continuous generalized 3 to 6 Hz theta-delta slowing.  Intermittent generalized periodic discharges with triphasic morphology with fluctuating frequency of 0.25-1Hz  were noted without any evolution. Hyperventilation and photic stimulation were not performed.     ABNORMALITY - Periodic discharges with triphasic morphology, generalized ( GPDs) - Continuous slow, generalized  IMPRESSION: This study is suggestive of moderate to severe diffuse encephalopathy, nonspecific etiology but could be related to toxic-metabolic etiology.  Additionally, generalized periodic discharges with triphasic morphology can be on the ictal-interictal continuum.  However, the frequency and morphology of the discharges is more commonly due to toxic-metabolic etiology.  No seizures or definite epileptiform discharges were seen throughout the recording.  [name]

## 2021-01-31 ENCOUNTER — Other Ambulatory Visit: Payer: Self-pay | Admitting: Internal Medicine

## 2021-01-31 DIAGNOSIS — I6300 Cerebral infarction due to thrombosis of unspecified precerebral artery: Secondary | ICD-10-CM

## 2021-01-31 DIAGNOSIS — I4891 Unspecified atrial fibrillation: Secondary | ICD-10-CM

## 2021-01-31 LAB — BASIC METABOLIC PANEL
Anion gap: 10 (ref 5–15)
Anion gap: 11 (ref 5–15)
BUN: 14 mg/dL (ref 8–23)
BUN: 14 mg/dL (ref 8–23)
CO2: 24 mmol/L (ref 22–32)
CO2: 26 mmol/L (ref 22–32)
Calcium: 9.1 mg/dL (ref 8.9–10.3)
Calcium: 9.1 mg/dL (ref 8.9–10.3)
Chloride: 101 mmol/L (ref 98–111)
Chloride: 97 mmol/L — ABNORMAL LOW (ref 98–111)
Creatinine, Ser: 0.91 mg/dL (ref 0.44–1.00)
Creatinine, Ser: 0.96 mg/dL (ref 0.44–1.00)
GFR, Estimated: 57 mL/min — ABNORMAL LOW (ref >60)
GFR, Estimated: >60 mL/min (ref >60)
Glucose, Bld: 122 mg/dL — ABNORMAL HIGH (ref 70–99)
Glucose, Bld: 125 mg/dL — ABNORMAL HIGH (ref 70–99)
Potassium: 3.6 mmol/L (ref 3.5–5.1)
Potassium: 3.7 mmol/L (ref 3.5–5.1)
Sodium: 133 mmol/L — ABNORMAL LOW (ref 135–145)
Sodium: 136 mmol/L (ref 135–145)

## 2021-01-31 LAB — CBC WITH DIFFERENTIAL/PLATELET
Abs Immature Granulocytes: 0.03 10*3/uL (ref 0.00–0.07)
Basophils Absolute: 0 10*3/uL (ref 0.0–0.1)
Basophils Relative: 0 %
Eosinophils Absolute: 0.1 10*3/uL (ref 0.0–0.5)
Eosinophils Relative: 1 %
HCT: 41.6 % (ref 36.0–46.0)
Hemoglobin: 14.6 g/dL (ref 12.0–15.0)
Immature Granulocytes: 0 %
Lymphocytes Relative: 29 %
Lymphs Abs: 2.2 10*3/uL (ref 0.7–4.0)
MCH: 31.9 pg (ref 26.0–34.0)
MCHC: 35.1 g/dL (ref 30.0–36.0)
MCV: 90.8 fL (ref 80.0–100.0)
Monocytes Absolute: 0.8 10*3/uL (ref 0.1–1.0)
Monocytes Relative: 10 %
Neutro Abs: 4.6 10*3/uL (ref 1.7–7.7)
Neutrophils Relative %: 60 %
Platelets: 253 10*3/uL (ref 150–400)
RBC: 4.58 MIL/uL (ref 3.87–5.11)
RDW: 12.9 % (ref 11.5–15.5)
WBC: 7.7 10*3/uL (ref 4.0–10.5)
nRBC: 0 % (ref 0.0–0.2)

## 2021-01-31 LAB — TROPONIN I (HIGH SENSITIVITY)
Troponin I (High Sensitivity): 41 ng/L — ABNORMAL HIGH (ref <18)
Troponin I (High Sensitivity): 41 ng/L — ABNORMAL HIGH (ref <18)

## 2021-01-31 LAB — MAGNESIUM: Magnesium: 2 mg/dL (ref 1.7–2.4)

## 2021-01-31 MED ORDER — METOPROLOL TARTRATE 50 MG PO TABS: 50.0000 mg | ORAL_TABLET | Freq: Two times a day (BID) | ORAL | Status: DC

## 2021-01-31 MED ORDER — METOPROLOL TARTRATE 25 MG PO TABS
25.0000 mg | ORAL_TABLET | Freq: Once | ORAL | Status: DC
  Filled 2021-01-31: qty 1

## 2021-01-31 MED ORDER — APIXABAN 5 MG PO TABS: 5.0000 mg | ORAL_TABLET | Freq: Two times a day (BID) | ORAL | 2 refills | Status: AC

## 2021-01-31 MED ORDER — METOPROLOL SUCCINATE ER 50 MG PO TB24: 50.0000 mg | ORAL_TABLET | Freq: Every day | ORAL | Status: DC

## 2021-01-31 MED ORDER — METOPROLOL TARTRATE 5 MG/5ML IV SOLN
2.5000 mg | Freq: Once | INTRAVENOUS | Status: AC
  Administered 2021-01-31: 2.5 mg via INTRAVENOUS
  Filled 2021-01-31: qty 5

## 2021-01-31 MED ORDER — METOPROLOL SUCCINATE ER 50 MG PO TB24: 50.0000 mg | ORAL_TABLET | Freq: Every day | ORAL | 11 refills | Status: AC

## 2021-01-31 MED ORDER — LORAZEPAM 2 MG/ML IJ SOLN
1.0000 mg | Freq: Once | INTRAMUSCULAR | Status: AC
  Administered 2021-01-31: 1 mg via INTRAVENOUS
  Filled 2021-01-31: qty 1

## 2021-01-31 MED ORDER — CLONAZEPAM 0.5 MG PO TABS
0.5000 mg | ORAL_TABLET | Freq: Every evening | ORAL | Status: DC | PRN
  Filled 2021-01-31: qty 1

## 2021-01-31 MED ORDER — AMOXICILLIN-POT CLAVULANATE 875-125 MG PO TABS: 1.0000 | ORAL_TABLET | Freq: Two times a day (BID) | ORAL | 0 refills | Status: AC

## 2021-01-31 MED ORDER — AZITHROMYCIN 500 MG PO TABS: 250.0000 mg | ORAL_TABLET | Freq: Every day | ORAL | 0 refills | Status: DC

## 2021-01-31 MED ORDER — METOPROLOL TARTRATE 25 MG PO TABS
25.0000 mg | ORAL_TABLET | Freq: Two times a day (BID) | ORAL | Status: DC
  Administered 2021-01-31: 25 mg via ORAL
  Filled 2021-01-31: qty 1

## 2021-01-31 NOTE — Discharge Summary (Addendum)
Name: Ashley Chan MRN: [id number] DOB: 04/11/1929 87 y.o. PCP: Monico Blitz, MD  Date of Admission: 01/28/2021 10:59 PM Date of Discharge: 01/31/2021 Attending Physician: Velna Ochs, MD  Discharge Diagnosis: 1. Acute embolic right parietal stroke 2. Atrial Flutter 3. Altered mental status in the setting of acute stroke, infection and hospital delirium 4.Hypokalemia  Discharge Medications: Allergies as of 01/31/2021       Reactions   Lidocaine Anaphylaxis   Iodinated Diagnostic Agents Other (See Comments)   Unknown reaction   Propranolol Other (See Comments)   Reaction unknown   Sulfa Antibiotics Other (See Comments)   Reaction unknown        Medication List     STOP taking these medications    aspirin 81 MG chewable tablet   clonazePAM 0.5 MG tablet Commonly known as: KLONOPIN   hydrochlorothiazide 25 MG tablet Commonly known as: HYDRODIURIL   isosorbide mononitrate 30 MG 24 hr tablet Commonly known as: IMDUR   labetalol 100 MG tablet Commonly known as: NORMODYNE   pantoprazole 40 MG tablet Commonly known as: PROTONIX       TAKE these medications    amLODipine 5 MG tablet Commonly known as: NORVASC Take 5 mg by mouth daily. What changed: Another medication with the same name was removed. Continue taking this medication, and follow the directions you see here.   amoxicillin-clavulanate 875-125 MG tablet Commonly known as: Augmentin Take 1 tablet by mouth 2 (two) times daily for 4 days.   apixaban 5 MG Tabs tablet Commonly known as: ELIQUIS Take 1 tablet (5 mg total) by mouth 2 (two) times daily.   atorvastatin 10 MG tablet Commonly known as: LIPITOR Take 10 mg by mouth daily.   azithromycin 500 MG tablet Commonly known as: Zithromax Take 0.5 tablets (250 mg total) by mouth daily for 4 days. Take 1 tablet daily for 3 days.   famotidine 20 MG tablet Commonly known as: PEPCID Take 20 mg by mouth 2 (two) times daily.   metoprolol  succinate 50 MG 24 hr tablet Commonly known as: Toprol XL Take 1 tablet (50 mg total) by mouth daily. Take with or immediately following a meal. What changed:  medication strength how much to take additional instructions   potassium chloride 10 MEQ tablet Commonly known as: KLOR-CON Take 1 tablet (10 mEq total) by mouth daily.               Durable Medical Equipment  (From admission, onward)           Start     Ordered   01/31/21 1454  For home use only DME standard manual wheelchair with seat cushion  Once       Comments: Patient suffers from stroke which impairs their ability to perform daily activities like bathing, dressing, feeding, grooming, and toileting in the home.  A cane, crutch, or walker will not resolve issue with performing activities of daily living. A wheelchair will allow patient to safely perform daily activities. Patient can safely propel the wheelchair in the home or has a caregiver who can provide assistance. Length of need Lifetime. Accessories: elevating leg rests (ELRs), wheel locks, extensions and anti-tippers.   01/31/21 1454   01/31/21 1453  For home use only DME 3 n 1  Once        01/31/21 1452   01/31/21 1453  For home use only DME Walker rolling  Once       Question Answer Comment  Walker: With  [place]   Patient needs a walker to treat with the following condition CVA (cerebral vascular accident) [facility])      01/31/21 1452            Disposition and follow-up:   Ashley Chan was discharged from [facility] in Stable condition.  At the hospital follow up visit please address:  1.  Acute cortical stroke of embolic etiology, new diagnosis of atrial flutter on Eliquis, hypokalemia, HTN   2.  Labs / imaging needed at time of follow-up: Electrolytes  3.  Pending labs/ test needing follow-up: none  Follow-up Appointments:  Follow-up Information     [facility] Follow up.   Why: Please  call to make an appointment within 4-6 weeks. Contact information: [address]     [place] [address] [phone]        Monico Blitz, MD Follow up.   Specialty: Internal Medicine Why: Please call to make an appointment within 1 week. Contact information: [address] [place] [address] [phone]                 Hospital Course by problem list: 1. 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 MRI Brain showed new acute cortical infarct without hemorrhagic conversion or mass effect. MRA Head and Neck showed high grade stenosis of mid right P2 PCA. Neurology was consulted and started patient on DAPT. Stroke stratification labs showed HgbA1c 6.3% at goal and LDL 63 at goal. TTE was negative for atrial thrombus with normal EF. INR 0.9. Patient was put on telemetry to identify any underlying arrhythmias. On evening of 7/5 patient was found to be in atrial flutter. Patient had been restarted on metoprolol for BP control on admission was given extra IV dose of metoprolol tartrate for rate control during atrial flutter event. Patient continued to be in atrial flutter the following morning. Neurology and internal medicine team discussed risks and benefits for starting anticoagulation. Given patient has good 24hr support at home living with her daughter and has not had recent falls, she is a good candidate for anticoagulation for stroke prevention. Patient started on Elilquis. Patient evaluated by PT/OT/SLP who recommend SNF though family preferred for her to be discharged home with home PT/OT. Patient will follow up with neurology and PCP after discharge.  2. Atrial  Flutter Patient was newly diagnosed with atrial flutter after an event the night of 7/5 during which patient became agitated and was found to be tachycardic. EKG showed atrial flutter with variable conduction. Patient had already been started on metoprolol for BP control during admission and this was increased to BID for rate control. Given her admission for new acute stroke, etiology of stroke was likely embolic secondary to atrial flutter. Neurology and IM discussed the risks and benefits of starting anticoagulation with the patient's family. Given she is a good candidate for anticoagulation, the patient will be started on Eliquis 5mg  BID prior to discharge which she will continue after discharge. She will be discharged on 50mg  metoprolol succinate for rate control.   3. #AMS in the setting of acute stroke vs due to underlying infectious or metabolic process #hallucinations #CAP Patient  presented with 3 day history of confusion. Previously she recognizes family members and manages ADLs and few iADLs at home. Patient likely was experiencing delirium in the setting of acute stroke. She received sedation for MRI and this contributed to her fatigue and confusion as well. Given her AMS seemed out of proportion to what would be expected for acute stroke, patient was worked up for any infectious sources or underlying metabolic conditions. EEG negative for seizure activity. Patient without fever or leukocytosis but with rare bacteria on UA. Additionally patient had wheezing L lobe and CXR found findings concerning for bronchopulmonary PNA. We started ceftriaxone and azithromycin for CAP and possible UTI. Patient was Patient's mental status improved from morning to afternoon on 7/5 though seems to wax and wane. Likely confusion will improve on antibiotics and her discharge to her home environment back to her daily routine. Patient will be discharged on Augmentin and azithromycin for 4 day course and follow up with  PCP.   #Possible UTI with rare bacteria on UA in an elderly patient with AMS UA with rare bacteria. Patient was altered and cannot obtain information about UTI symptoms. Patient was tachycardic without fever, without leukocytosis. We started her on empiric antibiotics for UTI on 7/5. She will discharge on PO antibiotics as above.  #Hypertension Patient has a history of hypertension and has been on amlodipine, HCTZ, Labetalol, metoprolol succinate, imdur in the past. We allowed for permissive hypertension in the setting of acute stroke for 24hrs. Patient was then restarted on her home metoprolol 25mg  and amlodipine 5mg  for Bps systolic [phone]. After patient went into atrial flutter, patient was switched to metoprolol tartrate 25 BID to assess for response in HR. Patient will be discharged on metoprolol succinate 50mg  daily and amlodipine 5mg  daily. Patient will follow up with PCP for further BP management.   #Hyperkalemia Patient has history of hyperkalemia. She is on a potassium supplement at home. Her potassium was low 3.3 and was repleted while inpatient. Patient will restart her potassium supplement after discharge.  #Hyperlipidemia Patient has history of HLD and takes atorvastatin 10mg  daily at home. LDL checked for stroke risk stratification. LDL was 63 which is at goal (<70). Patient was started on home dose of atorvastatin which patient will continue taking after discharge.   #GERD Patient has history of GERD for which we restarted home pepcid 20mg  BID while inpatient.   #Anxiety Patient was restarted on home dose of ativan 0.5mg  nightly after episode of agitation overnight. On admission family indicated that patient has not been taking klonopin due to patient preference and this medication will not be continued after discharge.    Subjective: Overnight: patient has episode of agitation overnight and was noted to be tachycardic. EKG showed atrial flutter. Patient was given extra  2.5mg  metoprolol IV. She was refusing PO medications at that time. She was restarted on her home dose of ativan 0.5mg  nightly.  Patient evaluated on rounds this morning. She is oriented to self and place (hospital). But was unable to identify her son who she had identified the day before. Confusion seems to wax and wane. Was later stating that it was raining in the patient room. She denied pain and was moving all 4 extremities.   Discharge Exam:   BP (!) 101/55   Pulse 78   Temp (!) 97.5 F (36.4 C) (Oral)   Resp 18   Ht 5\' 6"  (1.676 m)   Wt 80.7 kg   SpO2 100%   BMI 28.73  kg/m  Discharge exam:  Physical Exam: General: NAD, elderly african [place] female HENT: normocephalic, atraumatic, MMM Eyes:conjunctiva non-erythematous, no scleral icterus CV: irregular sounding rate and rhythm, no murmurs, rubs, gallops Pulmonary: traces wheezes L lung, otherwise clear to auscultation Abdominal: bowel sounds present, non distended, soft, no signs of acute abdomen Skin: no abrasions, ulcers, lesions, rashes or signs of infection Neuro:  Mental Status: awake, alert and oriented to self and place though orientation wax and wanes. She follows simple commands but can't follow 2 step commands. Was unable to recognize son this morning.  Speech/Language: Answering questions, speech normal Cns: CN346: eyes track examiner in all cardinal directions CN7: face appears symmetric CN12: tongue midline Motor: Patient moves all 4 extremities independently antigravity Psych: normal affect   Pertinent Labs, Studies, and Procedures:  CBC Latest Ref Rng & Units 01/31/2021 01/30/2021 01/29/2021  WBC 4.0 - 10.5 K/uL 7.7 6.0 5.5  Hemoglobin 12.0 - 15.0 g/dL 14.6 15.5(H) 12.8  Hematocrit 36.0 - 46.0 % 41.6 45.6 38.4  Platelets 150 - 400 K/uL 253 241 207   BMP Latest Ref Rng & Units 01/31/2021 01/30/2021 01/30/2021  Glucose 70 - 99 mg/dL 122(H) 125(H) 121(H)  BUN 8 - 23 mg/dL 14 14 8   Creatinine 0.44 - 1.00 mg/dL 0.91  0.96 0.73  Sodium 135 - 145 mmol/L 136 133(L) 138  Potassium 3.5 - 5.1 mmol/L 3.6 3.7 3.2(L)  Chloride 98 - 111 mmol/L 101 97(L) 103  CO2 22 - 32 mmol/L 24 26 25   Calcium 8.9 - 10.3 mg/dL 9.1 9.1 9.3   Urinalysis, Routine w reflex microscopic Urine, Clean Catch [[id number]] (Abnormal) Collected: 01/29/21 0140  Specimen: Urine, Clean Catch Updated: 01/29/21 0152   Color, Urine COLORLESS Abnormal    APPearance CLEAR   Specific Gravity, Urine 1.003 Low    pH 6.0   Glucose, UA NEGATIVE mg/dL    Hgb urine dipstick SMALL Abnormal    Bilirubin Urine NEGATIVE   Ketones, ur NEGATIVE mg/dL    Protein, ur NEGATIVE mg/dL    Nitrite NEGATIVE   Leukocytes,Ua NEGATIVE   WBC, UA 0-5 WBC/hpf    Bacteria, UA RARE Abnormal    Squamous Epithelial / LPF 0-5   Component Ref Range & Units 02:06  (01/31/21) 1 d ago  (01/30/21) 2 d ago  (01/29/21) 2 d ago  (01/29/21)  Troponin I (High Sensitivity) <18 ng/L 41 High   41 High  CM  22 High  CM  22 High    Protime-INR [[id number]] Collected: 01/29/21 1254  Specimen: Blood Updated: 01/29/21 1344   Prothrombin Time 12.4 seconds    INR 0.9   Ammonia [[id number]] Collected: 01/29/21 0751  Specimen: Blood Updated: 01/29/21 0841   Ammonia 28 umol/L    Resp Panel by RT-PCR (Flu A&B, Covid) Nasopharyngeal Swab [[id number]] Collected: 01/29/21 0214  Specimen: Nasopharyngeal(NP) swabs in vial transport medium from Nasopharyngeal Swab Updated: 01/29/21 0300   SARS Coronavirus 2 by RT PCR NEGATIVE   Influenza A by PCR NEGATIVE   Influenza B by PCR NEGATIVE   Hemoglobin A1c [[id number]] (Abnormal) Collected: 01/30/21 0238  Specimen: Blood Updated: 01/30/21 0321   Hgb A1c MFr Bld 6.3 High  %    Mean Plasma Glucose 134.11 mg/dL    Lipid panel [[id number]] Collected: 01/30/21 0238  Specimen: Blood Updated: 01/30/21 0333   Cholesterol 131 mg/dL    Triglycerides 133 mg/dL    HDL 41 mg/dL    Total CHOL/HDL Ratio 3.2 RATIO    VLDL 27 mg/dL  LDL Cholesterol 63 mg/dL     Magnesium [[id number]] Collected: 01/30/21 2357  Specimen: Blood Updated: 01/31/21 0048   Magnesium 2.0 mg/dL    CT Head Wo Contrast  Result Date: 01/29/2021 CLINICAL DATA:  Altered mental status EXAM: CT HEAD WITHOUT CONTRAST TECHNIQUE: Contiguous axial images were obtained from the base of the skull through the vertex without intravenous contrast. COMPARISON:  03/10/2017 FINDINGS: Brain: No evidence of acute infarction, hemorrhage, hydrocephalus, extra-axial collection or mass lesion/mass effect. Subcortical white matter and periventricular small vessel ischemic changes. In 39 changes related to old left cerebellar infarct (series 2/image 10). Vascular: Intracranial atherosclerosis. Skull: Normal. Negative for fracture or focal lesion. Sinuses/Orbits: The visualized paranasal sinuses are essentially clear. The mastoid air cells are unopacified. Other: None. IMPRESSION: No evidence of acute intracranial abnormality. Old left cerebellar infarct. Small vessel ischemic changes. Electronically Signed   By: Julian Hy M.D.   On: 01/29/2021 01:52   MR ANGIO HEAD WO CONTRAST  Result Date: 01/29/2021 CLINICAL DATA:  Small acute stroke on MRI brain EXAM: MRA HEAD WITHOUT CONTRAST MRA NECK WITHOUT CONTRAST TECHNIQUE: Angiographic images of the Circle of Willis were obtained using MRA technique without intravenous contrast. Angiographic images of the neck were obtained using MRA technique without intravenous contrast. Carotid stenosis measurements (when applicable) are obtained utilizing NASCET criteria, using the distal internal carotid diameter as the denominator. COMPARISON:  None. FINDINGS: MRA HEAD Motion artifact is present. Intracranial internal carotid arteries are patent with atherosclerotic irregularity. Moderate stenosis of the paraclinoid left ICA. Middle and anterior cerebral arteries are patent. Dominant right A1 ACA and congenitally absent left A1 ACA. Anterior communicating artery is present.  Intracranial vertebral arteries, basilar artery, posterior cerebral arteries are patent. There is focal high-grade stenosis of the mid right P2 PCA. Right communicating arteries present. No aneurysm. MRA NECK Significant motion artifact is present. Visualized portions of the carotids and vertebral arteries are patent. Stenosis cannot be evaluated. IMPRESSION: No proximal intracranial vessel occlusion. Moderate stenosis of the paraclinoid left ICA. Focal high-grade stenosis mid right P2 PCA. MRA neck is significantly degraded and essentially nondiagnostic or stenosis evaluation. Electronically Signed   By: Macy Mis M.D.   On: 01/29/2021 13:28   MR ANGIO NECK WO CONTRAST  Result Date: 01/29/2021 CLINICAL DATA:  Small acute stroke on MRI brain EXAM: MRA HEAD WITHOUT CONTRAST MRA NECK WITHOUT CONTRAST TECHNIQUE: Angiographic images of the Circle of Willis were obtained using MRA technique without intravenous contrast. Angiographic images of the neck were obtained using MRA technique without intravenous contrast. Carotid stenosis measurements (when applicable) are obtained utilizing NASCET criteria, using the distal internal carotid diameter as the denominator. COMPARISON:  None. FINDINGS: MRA HEAD Motion artifact is present. Intracranial internal carotid arteries are patent with atherosclerotic irregularity. Moderate stenosis of the paraclinoid left ICA. Middle and anterior cerebral arteries are patent. Dominant right A1 ACA and congenitally absent left A1 ACA. Anterior communicating artery is present. Intracranial vertebral arteries, basilar artery, posterior cerebral arteries are patent. There is focal high-grade stenosis of the mid right P2 PCA. Right communicating arteries present. No aneurysm. MRA NECK Significant motion artifact is present. Visualized portions of the carotids and vertebral arteries are patent. Stenosis cannot be evaluated. IMPRESSION: No proximal intracranial vessel occlusion. Moderate  stenosis of the paraclinoid left ICA. Focal high-grade stenosis mid right P2 PCA. MRA neck is significantly degraded and essentially nondiagnostic or stenosis evaluation. Electronically Signed   By: Macy Mis M.D.   On: 01/29/2021 13:28   MR  BRAIN WO CONTRAST  Result Date: 01/29/2021 CLINICAL DATA:  87 year old female with altered mental status. EXAM: MRI HEAD WITHOUT CONTRAST TECHNIQUE: Multiplanar, multiecho pulse sequences of the brain and surrounding structures were obtained without intravenous contrast. COMPARISON:  Head CT 0114 hours today. Report of [facility] Brain MRI 03/03/2017 (no images available). FINDINGS: Brain: Small cortical focus of restricted diffusion in the posterior right hemisphere at the lateral right inferior parietal lobe (series 2, image 27 and series 3, image 9). Associated mild T2 and FLAIR hyperintensity. No hemorrhage or mass effect. Left cerebellar encephalomalacia from previous hemorrhagic SCA infarct. Hemosiderin and gliosis. No other restricted diffusion. No midline shift, mass effect, evidence of mass lesion, ventriculomegaly, extra-axial collection or acute intracranial hemorrhage. Cervicomedullary junction and pituitary are within normal limits. Patchy and confluent bilateral cerebral white matter T2 and FLAIR hyperintensity. Deep gray matter nuclei and brainstem remain normal. No other convincing chronic cerebral blood products. Vascular: Major intracranial vascular flow voids are preserved with intracranial artery tortuosity and irregularity. Skull and upper cervical spine: Widespread cervical spine degeneration including disc degeneration at C4-C5 resulting in at least mild cervical spinal stenosis on series 7, image 13. Visualized bone marrow signal is within normal limits. Sinuses/Orbits: Postoperative changes to both globes. Left maxillary sinus mucous retention cyst. Other: Mastoids are clear.  Negative visible scalp and face. IMPRESSION: 1. Very small cortical acute  cortical infarct in the inferolateral right parietal lobe. No associated hemorrhage or mass effect. 2. Chronic hemorrhagic infarct of the left cerebellum in the SCA territory. 3. Superimposed advanced bilateral cerebral white matter signal changes, most commonly due to chronic small vessel disease. 4. Cervical spine degeneration with at least mild degenerative spinal stenosis suspected at C4-C5. Electronically Signed   By: Genevie Ann M.D.   On: 01/29/2021 09:08    ECHOCARDIOGRAM REPORT   01/30/2021 IMPRESSIONS   1. Left ventricular ejection fraction, by estimation, is 65 to 70%. The  left ventricle has normal function. The left ventricle has no regional  wall motion abnormalities. There is severe concentric left ventricular hypertrophy. Left ventricular diastolic   parameters are indeterminate.   2. Right ventricular systolic function is normal. The right ventricular size is normal. Moderately increased right ventricular wall thickness.   3. The mitral valve is has focal calcification. Mild mitral valve  regurgitation with evidence of valve prolapse (best seen in [facility] view).   4. The aortic valve is tricuspid. Aortic valve regurgitation is mild. No  aortic stenosis is present.   5. The inferior vena cava is normal in size with greater than 50%  respiratory variability, suggesting right atrial pressure of 3 mmHg. Comparison(s): A prior study was performed on 03/05/2017. No significant  change from prior study. Prior images reviewed side by side.  Rudean Haskell MD  EKG 07/05: atrial flutter with variable conduction reviewed by Dr. Philipp Ovens and internal medicine team  Discharge Instructions: Discharge Instructions     Diet - low sodium heart healthy   Complete by: As directed    Discharge instructions   Complete by: As directed    FOLLOW-UP INSTRUCTIONS:  Thank you for allowing [place] to be part of your care. You were hospitalized for Stroke.  Please follow up with the following providers: A.  Monico Blitz, MD, [address] [place] [address], [phone]  B. Neurology  Please note these changes made to your medications:   A. Medications to continue: amlodipine (NORVASC) 5 MG tablet, Take 5 mg by mouth daily. atorvastatin (LIPITOR) 10 MG  tablet, Take 10 mg by mouth daily. famotidine (PEPCID) 20 MG tablet, Take 20 mg by mouth 2 (two) times daily. metoprolol succinate (TOPROL-XL) 50 MG 24 hr tablet, Take 100 mg by mouth daily. potassium chloride (K-DUR) 10 MEQ tablet, Take 1 tablet (10 mEq total) by mouth daily. atorvastatin (LIPITOR) 10 MG tablet, Take 1 tablet (40 mg total) by mouth daily at 6 PM.      B. Medications to start: Azithromycin (ZITHROMAX) tablet 250 mg daily for 4 days ,  Augmentin 825-125 mg twice daily for 4 days Eliquis 5 mg twice daily   C. Medications to discontinue:  aspirin 81 MG chewable tablet, Chew 81 mg by mouth. isosorbide mononitrate (IMDUR) 30 MG 24 hr tablet, Take 30 mg by mouth daily. clonazePAM (KLONOPIN) tablet 0.5 mg, 0.5 mg, Oral, QHS PRN  Please make sure to make sure to take your Eliquis daily as prescribed. Please finish you 4 day course of antibiotics. Please follow up with your PCP within 1 week. Please make sure to follow up with neurology within 4-6 weeks.   Please call our clinic if you have any questions or concerns, we may be able to help and keep you from a long and expensive emergency room wait. Our clinic and after hours phone number is [phone], the best time to call is Monday through Friday 9 am to 4 pm but there is always someone available 24/7 if you have an emergency. If you need medication refills please notify your pharmacy one week in advance and they will send [place] a request.   Increase activity slowly   Complete by: As directed        Signed: Wayland Denis, MD 01/31/2021, 3:31 PM   Pager: [phone] Internal Medicine Resident

## 2021-01-31 NOTE — Progress Notes (Signed)
Hospital Bed:   Length of Need 6 Months  Patient has (list medical condition): CVA  The above medical condition requires: Patient requires the ability to reposition frequently  Head must be elevated greater than: 45 degrees  Bed type Semi-electric

## 2021-01-31 NOTE — Progress Notes (Signed)
STROKE TEAM PROGRESS NOTE   SUBJECTIVE (INTERVAL HISTORY) Ms. Burdell became agitated last night and was found to be tachycardic and EKG showed atrial flutter with rapid ventricular rate.  I reviewed the telemetry monitoring in person this morning and the rhythm continues to be a flutter.  Heart rate seems better controlled in the 80s.  Her daughter in law and son at the bedside.  Neurological exam remains unchanged.  OBJECTIVE Vitals:   01/31/21 0022 01/31/21 0346 01/31/21 0743 01/31/21 1149  BP: 118/76 (!) 157/81 (!) 169/97 (!) 101/55  Pulse: (!) 108 76 90 78  Resp: 19 17 18    Temp: 98.6 F (37 C) 97.6 F (36.4 C) 98.2 F (36.8 C) (!) 97.5 F (36.4 C)  TempSrc: Oral Oral Axillary Oral  SpO2: 99% 98% 99% 100%  Weight:      Height:        CBC:  Recent Labs  Lab 01/29/21 0105 01/30/21 0238 01/31/21 0206  WBC 5.5 6.0 7.7  NEUTROABS 2.3  --  4.6  HGB 12.8 15.5* 14.6  HCT 38.4 45.6 41.6  MCV 95.0 92.1 90.8  PLT 207 241 308    Basic Metabolic Panel:  Recent Labs  Lab 01/30/21 2357 01/31/21 0206  NA 133* 136  K 3.7 3.6  CL 97* 101  CO2 26 24  GLUCOSE 125* 122*  BUN 14 14  CREATININE 0.96 0.91  CALCIUM 9.1 9.1  MG 2.0  --     Lipid Panel:  Recent Labs  Lab 01/30/21 0238  CHOL 131  TRIG 133  HDL 41  CHOLHDL 3.2  VLDL 27  LDLCALC 63   HgbA1c:  Lab Results  Component Value Date   HGBA1C 6.3 (H) 01/30/2021   Urine Drug Screen: No results found for: LABOPIA, COCAINSCRNUR, LABBENZ, AMPHETMU, THCU, LABBARB  Alcohol Level No results found for: [facility]  IMAGING  Results for orders placed or performed during the hospital encounter of 01/28/21  MR BRAIN WO CONTRAST   Narrative   CLINICAL DATA:  87 year old female with altered mental status.  EXAM: MRI HEAD WITHOUT CONTRAST  TECHNIQUE: Multiplanar, multiecho pulse sequences of the brain and surrounding structures were obtained without intravenous contrast.  COMPARISON:  Head CT 0114 hours today. Report of  [facility] Brain MRI 03/03/2017 (no images available).  FINDINGS: Brain: Small cortical focus of restricted diffusion in the posterior right hemisphere at the lateral right inferior parietal lobe (series 2, image 27 and series 3, image 9). Associated mild T2 and FLAIR hyperintensity. No hemorrhage or mass effect.  Left cerebellar encephalomalacia from previous hemorrhagic SCA infarct. Hemosiderin and gliosis.  No other restricted diffusion. No midline shift, mass effect, evidence of mass lesion, ventriculomegaly, extra-axial collection or acute intracranial hemorrhage. Cervicomedullary junction and pituitary are within normal limits.  Patchy and confluent bilateral cerebral white matter T2 and FLAIR hyperintensity. Deep gray matter nuclei and brainstem remain normal. No other convincing chronic cerebral blood products.  Vascular: Major intracranial vascular flow voids are preserved with intracranial artery tortuosity and irregularity.  Skull and upper cervical spine: Widespread cervical spine degeneration including disc degeneration at C4-C5 resulting in at least mild cervical spinal stenosis on series 7, image 13. Visualized bone marrow signal is within normal limits.  Sinuses/Orbits: Postoperative changes to both globes. Left maxillary sinus mucous retention cyst.  Other: Mastoids are clear.  Negative visible scalp and face.  IMPRESSION: 1.  No associated hemorrhage or mass effect. 2. Chronic hemorrhagic infarct of the left cerebellum in the SCA territory.  3. Superimposed advanced bilateral cerebral white matter signal changes, most commonly due to chronic small vessel disease. 4. Cervical spine degeneration with at least mild degenerative spinal stenosis suspected at C4-C5.   Electronically Signed   By: Genevie Ann M.D.   On: 01/29/2021 09:08   MR ANGIO HEAD WO CONTRAST   Narrative   CLINICAL DATA:  Small acute stroke on MRI brain  EXAM: MRA HEAD WITHOUT CONTRAST  MRA  NECK WITHOUT CONTRAST  TECHNIQUE: Angiographic images of the Circle of Willis were obtained using MRA technique without intravenous contrast. Angiographic images of the neck were obtained using MRA technique without intravenous contrast. Carotid stenosis measurements (when applicable) are obtained utilizing NASCET criteria, using the distal internal carotid diameter as the denominator.  COMPARISON:  None.  FINDINGS: MRA HEAD  Motion artifact is present.  Intracranial internal carotid arteries are patent with atherosclerotic irregularity. Moderate stenosis of the paraclinoid left ICA. Middle and anterior cerebral arteries are patent. Dominant right A1 ACA and congenitally absent left A1 ACA. Anterior communicating artery is present. Intracranial vertebral arteries, basilar artery, posterior cerebral arteries are patent. There is focal high-grade stenosis of the mid right P2 PCA. Right communicating arteries present. No aneurysm.  MRA NECK  Significant motion artifact is present. Visualized portions of the carotids and vertebral arteries are patent. Stenosis cannot be evaluated.  IMPRESSION: No proximal intracranial vessel occlusion. Moderate stenosis of the paraclinoid left ICA. Focal high-grade stenosis mid right P2 PCA.  MRA neck is significantly degraded and essentially nondiagnostic or stenosis evaluation.   Electronically Signed   By: Macy Mis M.D.   On: 01/29/2021 13:28   CT Head Wo Contrast   Narrative   CLINICAL DATA:  Altered mental status  EXAM: CT HEAD WITHOUT CONTRAST  TECHNIQUE: Contiguous axial images were obtained from the base of the skull through the vertex without intravenous contrast.  COMPARISON:  03/10/2017  FINDINGS: Brain: No evidence of acute infarction, hemorrhage, hydrocephalus, extra-axial collection or mass lesion/mass effect.  Subcortical white matter and periventricular small vessel ischemic changes. In 9 changes  related to old left cerebellar infarct (series 2/image 10).  Vascular: Intracranial atherosclerosis.  Skull: Normal. Negative for fracture or focal lesion.  Sinuses/Orbits: The visualized paranasal sinuses are essentially clear. The mastoid air cells are unopacified.  Other: None.  IMPRESSION: No evidence of acute intracranial abnormality. Old left cerebellar infarct. Small vessel ischemic changes.   Electronically Signed   By: Julian Hy M.D.   On: 01/29/2021 01:52     PHYSICAL EXAM  General exam   HEENT-  Normocephalic, no lesions, without obvious abnormality.  Normal external eye and conjunctiva.   Cardiovascular- RRR on telemetry Lungs-Breathing comfortably on room air Abdomen-Soft Musculoskeletal-no joint tenderness, deformity or swelling Skin-warm and dry, well perfused  Neurologic Exam: General: NAD Mental Status: Alert, oriented to self, family around her, believes she is home,     Speech fluent without evidence of aphasia.  Diminished attention, registration and recall. Cranial Nerves: II:  Visual fields grossly normal, PERRL III,IV, VI: ptosis not present, extra-ocular motions intact bilaterally V,VII: Face smile symmetric VIII: Hearing intact to loud voice Motor: Moves limbs spontaneously x 4. Tone and bulk appropriate for age. Sensory: Light touch intact throughout, bilaterally Gait: Deferred   ASSESSMENT/PLAN [name] is a 87 y.o. 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 with left sided weakness  and waxing/waning hallucinations and confusion. Imaging on arrival showed chronic white matter disease, the old left cerebellar infarct and a very small acute cortical infarct in the inferolateral right parietal lobe.   # Stroke: Acute cortical infarct in the inferolateral right parietal  lobe likely from new onset atrial flutter/fibrillation Following her stroke in 2018, Ms. Higdon was on 81mg  ASA and a statin. Her son notes that the medication is controlled by his sister/Ms. Auerbach's daughter. Uncontrolled risk factors for stroke are limited to advanced age and family history; DM II and HTN are well controlled, LDL 63.  She has new onset atrial flutter/fibrillation and discussed risk benefit of anticoagulation with patient son and daughter-in-law and answered questions.  They would like to proceed with being aggressive and starting Eliquis.  No need for placing Loop recorder -  2D Echo left ventricular ejection fraction 65 to 70%. LDL 63, under goal of 70 HgbA1c 6.3, under of goal 7.0 Ongoing aggressive stroke risk factor management PT/OT/SLP  Visual Hallucinations Resolved by my visit. Dr. Leonel Ramsay feels that the stroke make have precipitated a mild delirium which has since resolved. Will continue to monitor.  Hypertension Most recently 136/75 but 140s-190s/70-100s earlier Continue norvasc 5mg  daily Permissive hypertension (OK if < 220/120) but gradually normalize in 5-7 days Long-term BP goal normotensive  Hyperlipidemia Continue 10mg  lipitor daily LDL 63, goal < 70 Continue statin at discharge  Hospital day # 1     Patient presented with increasing confusion and hallucinations and transient left hand weakness which has resolved.  MRI shows a small punctate right parietal infarct likely of embolic etiology from newly diagnosed atrial flutter/fibrillation.  Given patient's baseline dementia not sure if she is a good long-term anticoagulation candidate.  I discussed risk benefit with the patient's son and daughter-in-law and family  want [place] to be aggressive and agree with anticoagulation with Eliquis.  No need for concurrent antiplatelet therapy..  Greater than 50% time during this 25-minute visit were spent in counseling and coordination of care for stroke discussion  about evaluation and treatment plan and answering questions with family and care team. Discussed with Dr. Melven Sartorius internal medicine teaching service.  Follow-up as outpatient stroke clinic in 6 weeks.  Stroke team will sign off.  Kindly call for questions. Antony Contras, MD Medical Director [place] Pager: [phone] 01/31/2021 12:30 PM  To contact Stroke Continuity provider, please refer to [email or web address] After hours, contact General Neurology

## 2021-01-31 NOTE — Evaluation (Signed)
Occupational Therapy Evaluation Patient Details Name: Ashley Chan MRN: [id number] DOB: 1929/06/10 Today's Date: 01/31/2021    History of Present Illness 87 y/o female transferred to [facility] from [facility] on 7/4 for ongoing confusion and hallucinations x 3 days. MRI shows small acute infarct in R parietal lobe and chronic hemorrhagic infarct of L cerebellum. EEG negative for seizures but with mod to severe diffuse encephalopathy possibly related to toxic-metabolic etiology. PMH: L cerebellar CVA 2018, HTN, back pain, anxiety   Clinical Impression   Patient admitted for the diagnosis above.  PTA she lived with family and has assist as needed for ADL/IADL.  She did walk with a SPC.  Barriers are listed below.  Currently she is needing up to Max A for LB ADL, Mod A for upper body ADL, and up to Max A of two for simple tranfers.  The patient continues to have hallucinate, asking where the baby went to, and did have a brief episode of decreased LOA with attempted sit to stand.  RN was requested into the room, but she did return to baseline alertness after a few moments.  SNF is recommended unless the family can provide 24 hour mod to Max A.  OT will continue efforts in the acute setting to maximize her functional status.      Follow Up Recommendations  SNF    Equipment Recommendations  Wheelchair (measurements OT);Wheelchair cushion (measurements OT)    Recommendations for Other Services       Precautions / Restrictions Precautions Precautions: Fall Restrictions Weight Bearing Restrictions: No Other Position/Activity Restrictions: having a lot of pain to R ankle      Mobility Bed Mobility Overal bed mobility: Needs Assistance Bed Mobility: Supine to Sit;Sit to Supine;Rolling Rolling: Max assist   Supine to sit: Max assist Sit to supine: Total assist;+2 for physical assistance;+2 for safety/equipment   General bed mobility comments: maxA to complete bed mobility with assist for  sequencing, bringing LEs off bed, and trunk elevation. Returned to bed with totalA+2 due decreased responsiveness, see General Comments. MaxA to roll to perform pericare Patient Response: Cooperative  Transfers Overall transfer level: Needs assistance Equipment used: 2 person hand held assist Transfers: Sit to/from Stand Sit to Stand: Mod assist;+2 physical assistance;+2 safety/equipment         General transfer comment: modA+2 to rise and steady with HHAx2. Upon standing, patient not bearing weight onto R LE due to increased pain. Swelling noted    Balance Overall balance assessment: Needs assistance Sitting-balance support: Bilateral upper extremity supported;Feet supported Sitting balance-Leahy Scale: Poor Sitting balance - Comments: requires at minimum minA to maintain sitting balance Postural control: Posterior lean Standing balance support: Bilateral upper extremity supported;During functional activity Standing balance-Leahy Scale: Zero Standing balance comment: maxA+2 to maintain standing due to inability to bear weight through R LE due to pain                           ADL either performed or assessed with clinical judgement   ADL Overall ADL's : Needs assistance/impaired Eating/Feeding: Set up   Grooming: Wash/dry hands;Wash/dry face;Min guard;Sitting   Upper Body Bathing: Moderate assistance;Sitting   Lower Body Bathing: Maximal assistance;Sitting/lateral leans   Upper Body Dressing : Moderate assistance;Sitting   Lower Body Dressing: Maximal assistance;Sitting/lateral leans   Toilet Transfer: +2 for physical assistance;Maximal assistance           Functional mobility during ADLs: Maximal assistance;+2 for physical assistance  Vision Patient Visual Report: No change from baseline Vision Assessment?: Vision impaired- to be further tested in functional context Additional Comments: no apparent visual field cuts.     Perception     Praxis       Pertinent Vitals/Pain Pain Assessment: Faces Faces Pain Scale: Hurts even more Pain Location: R LE with movement Pain Descriptors / Indicators: Grimacing;Guarding;Aching Pain Intervention(s): Monitored during session;Limited activity within patient's tolerance     Hand Dominance Left   Extremity/Trunk Assessment Upper Extremity Assessment Upper Extremity Assessment: Generalized weakness   Lower Extremity Assessment Lower Extremity Assessment: Defer to PT evaluation   Cervical / Trunk Assessment Cervical / Trunk Assessment: Kyphotic   Communication Communication Communication: No difficulties   Cognition Arousal/Alertness: Awake/alert Behavior During Therapy: WFL for tasks assessed/performed Overall Cognitive Status: Impaired/Different from baseline Area of Impairment: Attention;Memory;Following commands;Safety/judgement;Awareness;Problem solving                   Current Attention Level: Sustained Memory: Decreased recall of precautions;Decreased short-term memory Following Commands: Follows one step commands inconsistently;Follows one step commands with increased time Safety/Judgement: Decreased awareness of safety;Decreased awareness of deficits Awareness: Intellectual Problem Solving: Slow processing;Difficulty sequencing;Requires verbal cues;Requires tactile cues General Comments: Following commands with increased time but decreased awareness for safety and of deficits. Slow processing tasks requiring verbal and tactile cueing.   General Comments  Following 2nd attempt at sit to stand, patient incontinent of urine thus returned to sitting. Patient alert and responsive upon sitting and initial clean up. After 30 seconds, patient became less responsive and eyes closed. Not responding to voice or painful stimuli. Returned to supine with totalA+2, notified RN. Upon return to supine, patient became responsive to sternal rub but not voice. On RN arrival, patient  responding to voice and answering RN. BP in supine 101/55.    Exercises     Shoulder Instructions      Home Living Family/patient expects to be discharged to:: Private residence Living Arrangements: Children Available Help at Discharge: Family;Available 24 hours/day Type of Home: House Home Access: Ramped entrance     Home Layout: One level     Bathroom Shower/Tub: Teacher, early years/pre: Handicapped height Bathroom Accessibility: Yes How Accessible: Accessible via walker Home Equipment: [place] - 2 wheels;Cane - single point          Prior Functioning/Environment Level of Independence: Independent with assistive device(s)        Comments: uses SPC at times, and has assist as needed for ADL completion.  Does not assist with home management, meal prep, med management.        OT Problem List: Decreased strength;Decreased activity tolerance;Impaired balance (sitting and/or standing);Decreased safety awareness;Decreased cognition;Pain      OT Treatment/Interventions: Self-care/ADL training;Neuromuscular education;Balance training;Cognitive remediation/compensation;Therapeutic activities    OT Goals(Current goals can be found in the care plan section) Acute Rehab OT Goals Patient Stated Goal: none stated OT Goal Formulation: Patient unable to participate in goal setting Time For Goal Achievement: 02/14/21 Potential to Achieve Goals: Fair ADL Goals Pt Will Perform Grooming: with set-up;sitting Pt Will Perform Upper Body Bathing: with supervision;sitting Pt Will Perform Upper Body Dressing: with supervision;sitting Pt Will Transfer to Toilet: with min assist;stand pivot transfer;bedside commode  OT Frequency: Min 2X/week   Barriers to D/C:    none noted       Co-evaluation PT/OT/SLP Co-Evaluation/Treatment: Yes Reason for Co-Treatment: Complexity of the patient's impairments (multi-system involvement);For patient/therapist safety PT goals addressed  during session: Mobility/safety with  mobility;Balance OT goals addressed during session: ADL's and self-care      AM-PAC OT "6 Clicks" Daily Activity     Outcome Measure Help from another person eating meals?: A Little Help from another person taking care of personal grooming?: A Little Help from another person toileting, which includes using toliet, bedpan, or urinal?: A Lot Help from another person bathing (including washing, rinsing, drying)?: A Lot Help from another person to put on and taking off regular upper body clothing?: A Lot Help from another person to put on and taking off regular lower body clothing?: A Lot 6 Click Score: 14   End of Session Equipment Utilized During Treatment: Gait belt Nurse Communication: Mobility status  Activity Tolerance: Patient limited by pain;Treatment limited secondary to medical complications (Comment);Other (comment) (patient with episode of decreased LOA after attempting to stand and transfer to recliner.  RN in the room, and patient with return to baseline alertness after a few minutes.) Patient left: in bed;with call bell/phone within reach;with bed alarm set;with family/visitor present;with nursing/sitter in room  OT Visit Diagnosis: Unsteadiness on feet (R26.81);Pain Pain - Right/Left: Right Pain - part of body: Ankle and joints of foot                Time: 7169-6789 OT Time Calculation (min): 27 min Charges:  OT General Charges $OT Visit: 1 Visit OT Evaluation $OT Eval Moderate Complexity: 1 Mod  01/31/2021  Rich, OTR/L  Acute Rehabilitation Services  Office:  [place] 01/31/2021, 1:49 PM

## 2021-01-31 NOTE — TOC Transition Note (Addendum)
Transition of Care [facility]) - CM/SW Discharge Note   Patient Details  Name: Ashley Chan MRN: [id number] Date of Birth: July 10, 1929  Transition of [facility]) CM/SW Contact:  Pollie Friar, RN Phone Number: 01/31/2021, 3:52 PM   Clinical Narrative:    Recommendations are for SNF rehab. CM met with the patient and her son. Son prefers home.  Patient is discharging home with home health services through [facility] [place]. Orders faxed to the [facility] agency.  Pts son states she will have 24 hour supervision and assistance.  Son states the patient has all ordered DME already at home.  Son states he will provide transport home.  1600: family is requesting a hospital bed for home. Therapies in agreement with patient needing one for home. Family would like to use [place] for the bed. CM will fax them the orders.   Final next level of care: Home w Home Health Services Barriers to Discharge: No Barriers Identified   Patient Goals and CMS Choice   CMS Medicare.gov Compare Post Acute Care list provided to:: Patient Represenative (must comment) Choice offered to / list presented to : Adult Children  Discharge Placement                       Discharge Plan and Services                          HH Arranged: PT, OT HH Agency: [place] Date [facility] Agency Contacted: 01/31/21   Representative spoke with at [place]: CM spoke to [employer] and faxed them the orders  Social Determinants of Health (SDOH) Interventions     Readmission Risk Interventions No flowsheet data found.

## 2021-01-31 NOTE — Evaluation (Signed)
Physical Therapy Evaluation Patient Details Name: Ashley Chan MRN: [id number] DOB: 1929-05-06 Today's Date: 01/31/2021   History of Present Illness  87 y/o female transferred to [facility] from [place] ED on 7/4 for ongoing confusion and hallucinations x 3 days. MRI shows small acute infarct in R parietal lobe and chronic hemorrhagic infarct of L cerebellum. EEG negative for seizures but with mod to severe diffuse encephalopathy possibly related to toxic-metabolic etiology. PMH: L cerebellar CVA 2018, HTN, back pain, anxiety  Clinical Impression  PTA, patient lives with 2 daughters and son reports independence with mobility and sporadically uses SPC "if she remembers it". Patient presents with impaired balance, decreased activity tolerance, generalized weakness, impaired cognition, and impaired functional mobility. Patient currently requiring maxA for bed mobility and modA+2 for sit to stand transfer. Patient complaining of R LE pain with touch and movement. Following sit to stand transfer and incontinence in standing, patient returned to seated position with decreased responsiveness. Returned to supine where patient began to respond to painful stimuli, notified RN. On RN arrival, patient responding to voice and conversing, BP 101/55. Patient will benefit from skilled PT services during acute stay to address listed deficits. Recommend SNF following discharge to maximize functional independence and safety prior to returning home.     Follow Up Recommendations SNF;Supervision/Assistance - 24 hour    Equipment Recommendations  Rolling [place]ren with 5" wheels    Recommendations for Other Services       Precautions / Restrictions Precautions Precautions: Fall Restrictions Weight Bearing Restrictions: No      Mobility  Bed Mobility Overal bed mobility: Needs Assistance Bed Mobility: Supine to Sit;Sit to Supine;Rolling Rolling: Max assist   Supine to sit: Max assist Sit to supine: Total assist;+2 for  physical assistance;+2 for safety/equipment   General bed mobility comments: maxA to complete bed mobility with assist for sequencing, bringing LEs off bed, and trunk elevation. Returned to bed with totalA+2 due decreased responsiveness, see General Comments. MaxA to roll to perform pericare    Transfers Overall transfer level: Needs assistance Equipment used: 2 person hand held assist Transfers: Sit to/from Stand Sit to Stand: Mod assist;+2 physical assistance;+2 safety/equipment         General transfer comment: modA+2 to rise and steady with HHAx2. Upon standing, patient not bearing weight onto R LE due to increased pain. Swelling noted  Ambulation/Gait             General Gait Details: unable  Stairs            Wheelchair Mobility    Modified Rankin (Stroke Patients Only) Modified Rankin (Stroke Patients Only) Pre-Morbid Rankin Score: Slight disability Modified Rankin: Severe disability     Balance Overall balance assessment: Needs assistance Sitting-balance support: Bilateral upper extremity supported;Feet supported Sitting balance-Leahy Scale: Poor Sitting balance - Comments: requires at minimum minA to maintain sitting balance Postural control: Posterior lean Standing balance support: Bilateral upper extremity supported;During functional activity Standing balance-Leahy Scale: Zero Standing balance comment: maxA+2 to maintain standing due to inability to bear weight through R LE due to pain                             Pertinent Vitals/Pain Pain Assessment: Faces Faces Pain Scale: Hurts even more Pain Location: R LE with movement Pain Descriptors / Indicators: Grimacing;Guarding;Aching Pain Intervention(s): Limited activity within patient's tolerance;Monitored during session;Repositioned    Home Living Family/patient expects to be discharged to:: Private residence  Living Arrangements: Children (2 daughters) Available Help at Discharge:  Family;Available 24 hours/day Type of Home: House Home Access: Ramped entrance     Home Layout: One level Home Equipment: [place] Hagans - 2 wheels;Cane - single point      Prior Function Level of Independence: Independent with assistive device(s)         Comments: son reports using SPC sporadically and forgets SPC at times     Hand Dominance        Extremity/Trunk Assessment   Upper Extremity Assessment Upper Extremity Assessment: Defer to OT evaluation    Lower Extremity Assessment Lower Extremity Assessment: Difficult to assess due to impaired cognition;Generalized weakness (swelling and painnoted to R LE)    Cervical / Trunk Assessment Cervical / Trunk Assessment: Kyphotic  Communication   Communication: No difficulties  Cognition Arousal/Alertness: Awake/alert Behavior During Therapy: WFL for tasks assessed/performed Overall Cognitive Status: Impaired/Different from baseline Area of Impairment: Attention;Memory;Following commands;Safety/judgement;Awareness;Problem solving                   Current Attention Level: Sustained Memory: Decreased recall of precautions;Decreased short-term memory Following Commands: Follows one step commands inconsistently;Follows one step commands with increased time Safety/Judgement: Decreased awareness of safety;Decreased awareness of deficits Awareness: Intellectual Problem Solving: Slow processing;Difficulty sequencing;Requires verbal cues;Requires tactile cues General Comments: Following commands with increased time but decreased awareness for safety and of deficits. Slow processing tasks requiring verbal and tactile cueing.      General Comments General comments (skin integrity, edema, etc.): Following 2nd attempt at sit to stand, patient incontinent of urine thus returned to sitting. Patient alert and responsive upon sitting and initial clean up. After 30 seconds, patient became less responsive and eyes closed. Not responding  to voice or painful stimuli. Returned to supine with totalA+2, notified RN. Upon return to supine, patient became responsive to sternal rub but not voice. On RN arrival, patient responding to voice and answering RN. BP in supine 101/55.    Exercises     Assessment/Plan    PT Assessment Patient needs continued PT services  PT Problem List Decreased strength;Decreased activity tolerance;Decreased balance;Decreased mobility;Decreased coordination;Decreased cognition;Decreased knowledge of use of DME;Decreased safety awareness;Decreased knowledge of precautions       PT Treatment Interventions DME instruction;Gait training;Functional mobility training;Therapeutic activities;Therapeutic exercise;Balance training;Neuromuscular re-education;Patient/family education;Wheelchair mobility training    PT Goals (Current goals can be found in the Care Plan section)  Acute Rehab PT Goals Patient Stated Goal: to get better PT Goal Formulation: With patient/family Time For Goal Achievement: 02/14/21 Potential to Achieve Goals: Fair    Frequency Min 3X/week   Barriers to discharge        Co-evaluation PT/OT/SLP Co-Evaluation/Treatment: Yes Reason for Co-Treatment: Necessary to address cognition/behavior during functional activity;For patient/therapist safety PT goals addressed during session: Mobility/safety with mobility;Balance         AM-PAC PT "6 Clicks" Mobility  Outcome Measure Help needed turning from your back to your side while in a flat bed without using bedrails?: A Lot Help needed moving from lying on your back to sitting on the side of a flat bed without using bedrails?: A Lot Help needed moving to and from a bed to a chair (including a wheelchair)?: Total Help needed standing up from a chair using your arms (e.g., wheelchair or bedside chair)?: Total Help needed to walk in hospital room?: Total Help needed climbing 3-5 steps with a railing? : Total 6 Click Score: 8    End of  Session Equipment  Utilized During Treatment: Gait belt Activity Tolerance: Treatment limited secondary to medical complications (Comment) (decreased responsiveness near end of session) Patient left: in bed;with call bell/phone within reach;with bed alarm set;with family/visitor present Nurse Communication: Mobility status PT Visit Diagnosis: Unsteadiness on feet (R26.81);Muscle weakness (generalized) (M62.81);Other abnormalities of gait and mobility (R26.89);Difficulty in walking, not elsewhere classified (R26.2)    Time: 3354-5625 PT Time Calculation (min) (ACUTE ONLY): 46 min   Charges:   PT Evaluation $PT Eval Moderate Complexity: 1 Mod PT Treatments $Therapeutic Activity: 8-22 mins        Finn Amos A. Gilford Rile PT, DPT Acute Rehabilitation Services Pager [phone] Office [phone]   Linna Hoff 01/31/2021, 12:46 PM

## 2021-01-31 NOTE — Evaluation (Signed)
SLP Cancellation Note  Patient Details Name: Ashley Chan MRN: [id number] DOB: 1928-11-13   Cancelled treatment:       Reason Eval/Treat Not Completed: Other (comment) (pt currently sleeping, note agitation over night, will continue efforts) Kathleen Lime, MS [facility] SLP Acute Rehab Services Office [phone] Pager [phone]   Macario Golds 01/31/2021, 11:05 AM

## 2021-01-31 NOTE — Progress Notes (Addendum)
Paged at 2305 by nurse due to increased agitation and tachycardia.  Patient was in hand mitts, but continued to pull at lines and catheter.  HR between 120-130. Initiated home medications this morning of Amlodipine 5 mg and Metoprolol 25 mg.  Repeat EKG showed atrial fibrillation with RVR. CHADVASC score of 6 (Age, Female, HTN, Stroke) Consider risk vs benefits of DOACs upon discharge  Ordered BMP, Mg and troponin.   Klonopin ordered for agitation, however patient refused to take PO medication.  Ativan 1 mg ordered. Metoprolol tartrate tablet ordered, but patient refused PO medication.  Ordered IV metoprolol tartrate 2.5 mg

## 2021-02-03 ENCOUNTER — Other Ambulatory Visit: Payer: Self-pay | Admitting: Internal Medicine

## 2021-05-08 ENCOUNTER — Other Ambulatory Visit: Payer: Self-pay | Admitting: Internal Medicine

## 2021-10-02 ENCOUNTER — Other Ambulatory Visit: Payer: Self-pay | Admitting: Internal Medicine

## 2023-09-06 ENCOUNTER — Other Ambulatory Visit: Payer: Self-pay

## 2023-09-06 ENCOUNTER — Encounter (HOSPITAL_COMMUNITY): Payer: Self-pay

## 2023-09-06 ENCOUNTER — Emergency Department (HOSPITAL_COMMUNITY)
Admission: EM | Admit: 2023-09-06 | Discharge: 2023-09-06 | Disposition: A | Discharge status: Home / Self Care | Payer: Medicare Other | Attending: Emergency Medicine | Admitting: Emergency Medicine

## 2023-09-06 DIAGNOSIS — I10 Essential (primary) hypertension: Secondary | ICD-10-CM | POA: Insufficient documentation

## 2023-09-06 DIAGNOSIS — H9201 Otalgia, right ear: Secondary | ICD-10-CM | POA: Diagnosis present

## 2023-09-06 DIAGNOSIS — H7291 Unspecified perforation of tympanic membrane, right ear: Secondary | ICD-10-CM

## 2023-09-06 DIAGNOSIS — Z79899 Other long term (current) drug therapy: Secondary | ICD-10-CM | POA: Diagnosis not present

## 2023-09-06 DIAGNOSIS — Z8673 Personal history of transient ischemic attack (TIA), and cerebral infarction without residual deficits: Secondary | ICD-10-CM | POA: Insufficient documentation

## 2023-09-06 DIAGNOSIS — H66011 Acute suppurative otitis media with spontaneous rupture of ear drum, right ear: Secondary | ICD-10-CM | POA: Diagnosis not present

## 2023-09-06 DIAGNOSIS — H6691 Otitis media, unspecified, right ear: Secondary | ICD-10-CM

## 2023-09-06 MED ORDER — AMOXICILLIN 500 MG PO CAPS: 500.0000 mg | ORAL_CAPSULE | Freq: Three times a day (TID) | ORAL | 0 refills | Status: AC

## 2023-09-06 NOTE — ED Triage Notes (Signed)
 Pt BIB daughter for rt ear bleeding. Pt states ear has been itching for a week and this morning woke up to bleeding from ear. Visual during triage blood noted to cotton ball and inside ear blood noted towards ear drum.

## 2023-09-06 NOTE — Discharge Instructions (Signed)
 The bleeding from your right ear is likely secondary to an ear infection that has ruptured.  Avoid getting water  in your ear.  You may use a cottonball in your ear if needed.  Avoid putting anything in your ear for now.  Take the antibiotic as directed.  You may take Tylenol  if needed for pain.  Try to avoid forcefully blowing your nose.  Please follow-up with your primary care provider next week or with the ear nose and throat doctor listed above if your symptoms are not improving.

## 2023-09-06 NOTE — ED Provider Notes (Signed)
 [place] EMERGENCY DEPARTMENT AT [facility] Provider Note   CSN: [id number] Arrival date & time: 09/06/23  9076     History  Chief Complaint  Patient presents with   Otalgia    Ashley Chan is a 89 y.o. female.   Otalgia Associated symptoms: congestion and ear discharge   Associated symptoms: no abdominal pain, no cough, no fever, no headaches, no hearing loss, no neck pain, no sore throat and no vomiting         Ashley Chan is a 89 y.o. female with past medical history of 2 prior strokes, atrial fibrillation, anticoagulated on Eliquis , hypertension, who presents to the Emergency Department complaining of itching and pressure-like pain of her right ear.  Symptoms have been present for 1 week.  States she woke this morning and noticed bright red blood from her right ear.  She has been using a cotton ball in her ear to control the bleeding.  She states the previous itching and pressure sensation of her ear has now resolved.  She also endorses recent nasal congestion, sneezing and history of seasonal allergies.  She denies any fall or trauma to her head or ear, headache, dizziness, fever or chills, neck pain or facial pain.  She does wear hearing aids daily    Home Medications Prior to Admission medications   Medication Sig Start Date End Date Taking? Authorizing Provider  amLODipine  (NORVASC ) 5 MG tablet Take 5 mg by mouth daily.    [provider]  apixaban  (ELIQUIS ) 5 MG TABS tablet Take 1 tablet (5 mg total) by mouth 2 (two) times daily. 01/31/21 05/01/21  Barbaraann Katz, MD  atorvastatin  (LIPITOR) 10 MG tablet Take 10 mg by mouth daily. 10/05/20   [provider]  famotidine  (PEPCID ) 20 MG tablet Take 20 mg by mouth 2 (two) times daily. 07/13/20   [provider]  metoprolol  succinate (TOPROL  XL) 50 MG 24 hr tablet Take 1 tablet (50 mg total) by mouth daily. Take with or immediately following a meal. 01/31/21 01/31/22  Barbaraann Katz, MD   potassium chloride  (K-DUR) 10 MEQ tablet Take 1 tablet (10 mEq total) by mouth daily. 03/20/17   Angiulli, Toribio PARAS, PA-C      Allergies    Lidocaine, Iodinated contrast media, Propranolol, and Sulfa antibiotics    Review of Systems   Review of Systems  Constitutional:  Negative for chills and fever.  HENT:  Positive for congestion, ear discharge and ear pain. Negative for facial swelling, hearing loss, sore throat and trouble swallowing.   Respiratory:  Negative for cough and shortness of breath.   Cardiovascular:  Negative for chest pain.  Gastrointestinal:  Negative for abdominal pain, nausea and vomiting.  Musculoskeletal:  Negative for neck pain.  Neurological:  Negative for dizziness, facial asymmetry, weakness, numbness and headaches.    Physical Exam Updated Vital Signs BP (!) 167/85   Pulse 81   Temp 98.3 F (36.8 C) (Axillary)   Resp 18   Ht 5' 6 (1.676 m)   Wt 83.9 kg   SpO2 97%   BMI 29.86 kg/m  Physical Exam Vitals and nursing note reviewed.  Constitutional:      General: She is not in acute distress.    Appearance: Normal appearance. She is not ill-appearing or toxic-appearing.  HENT:     Right Ear: Ear canal normal.     Left Ear: Ear canal normal. No foreign body. No mastoid tenderness. There is hemotympanum. Tympanic membrane is  perforated.     Ears:     Comments: Small amount of blood within the right ear canal.  Perforation to the TM.  No edema of the canal.  No swelling or mastoid tenderness.    Mouth/Throat:     Mouth: Mucous membranes are moist.     Pharynx: Oropharynx is clear. No oropharyngeal exudate or posterior oropharyngeal erythema.  Cardiovascular:     Rate and Rhythm: Normal rate and regular rhythm.     Pulses: Normal pulses.  Pulmonary:     Effort: Pulmonary effort is normal.  Abdominal:     Palpations: Abdomen is soft.     Tenderness: There is no abdominal tenderness.  Musculoskeletal:        General: Normal range of motion.      Cervical back: Normal range of motion. No rigidity.  Lymphadenopathy:     Cervical: No cervical adenopathy.  Skin:    General: Skin is warm.     Capillary Refill: Capillary refill takes less than 2 seconds.  Neurological:     General: No focal deficit present.     Mental Status: She is alert.     Sensory: No sensory deficit.     Motor: No weakness.     ED Results / Procedures / Treatments   Labs (all labs ordered are listed, but only abnormal results are displayed) Labs Reviewed - No data to display  EKG None  Radiology No results found.  Procedures Procedures    Medications Ordered in ED Medications - No data to display  ED Course/ Medical Decision Making/ A&P                                 Medical Decision Making Patient here from home for evaluation of right ear pain, pressure for 1 week.  Woke this morning with bright red blood from her right ear.  Pain is now resolved.  No headache dizziness or history of trauma to the face or ear.     I suspect sx's related to AOM with tympanic rupture.  Blood in the canal without evidence of active bleeding. No hx of trauma to face or ear      Amount and/or Complexity of Data Reviewed Discussion of management or test interpretation with external provider(s):   AOM with TM rupture. Pt hypertensive on recheck, but hasn't taken AM meds.  She wears hearing aids.  Will have her start oral abx.  Recommended to avoid wearing hearing aids, blowing her nose and close out pt f/u in one week.  Return precautions given.  Risk Prescription drug management.           Final Clinical Impression(s) / ED Diagnoses Final diagnoses:  Acute otitis media with perforation, right    Rx / DC Orders ED Discharge Orders     None         Herlinda Milling, PA-C 09/08/23 1924    Franklyn Sid SAILOR, MD 09/20/23 1136

## 2023-11-20 ENCOUNTER — Institutional Professional Consult (permissible substitution) (INDEPENDENT_AMBULATORY_CARE_PROVIDER_SITE_OTHER): Payer: PRIVATE HEALTH INSURANCE

## 2024-01-27 ENCOUNTER — Ambulatory Visit (INDEPENDENT_AMBULATORY_CARE_PROVIDER_SITE_OTHER): Payer: PRIVATE HEALTH INSURANCE | Admitting: Otolaryngology

## 2024-01-27 ENCOUNTER — Encounter (INDEPENDENT_AMBULATORY_CARE_PROVIDER_SITE_OTHER): Payer: Self-pay | Admitting: Otolaryngology

## 2024-01-27 VITALS — BP 165/83 | HR 105 | Ht 66.0 in

## 2024-01-27 DIAGNOSIS — S00412D Abrasion of left ear, subsequent encounter: Secondary | ICD-10-CM | POA: Diagnosis not present

## 2024-01-27 DIAGNOSIS — H903 Sensorineural hearing loss, bilateral: Secondary | ICD-10-CM | POA: Diagnosis not present

## 2024-01-27 DIAGNOSIS — H608X3 Other otitis externa, bilateral: Secondary | ICD-10-CM | POA: Diagnosis not present

## 2024-01-27 DIAGNOSIS — S00412S Abrasion of left ear, sequela: Secondary | ICD-10-CM

## 2024-01-27 DIAGNOSIS — H6121 Impacted cerumen, right ear: Secondary | ICD-10-CM

## 2024-01-27 MED ORDER — FLUOCINOLONE ACETONIDE 0.01 % OT OIL: 4.0000 [drp] | TOPICAL_OIL | Freq: Two times a day (BID) | OTIC | 5 refills | Status: AC

## 2024-01-27 NOTE — Progress Notes (Signed)
 Dear Dr. Maree, Here is my assessment for our mutual patient, Ashley Chan. Thank you for allowing me the opportunity to care for your patient. Please do not hesitate to contact me should you have any other questions. Sincerely, Dr. Eldora Blanch  Otolaryngology Clinic Note Referring provider: Dr. Maree HPI:  Ashley Chan is a 89 y.o. female kindly referred by Dr. Maree for evaluation of concern for tympanic membrane perforation  Initial visit (12/2023): Patient reports: woke up this past spring, and left ear was itching a lot so after manipulation, left ear started bleeding. Went to ED and was noted to be hemostatic, but continues to itch. She reports that the ear itches intermittently. She was also diagnosed with AOM at that point, and received antibiotics - however, she denied any other symptoms at that point including pain or pressure. Currently, she denies any ear symptoms except for bilateral tinnitus and long-standing hearing loss for which she wears hearing aids. Last audiogram - she does not remember. Denies history of frequent ear infections or issues. Q tip use every now and then Patient denies: ear pain, fullness, vertigo, drainage Patient additionally denies: deep pain in ear canal, eustachian tube symptoms such as popping/crackling, sensitive to pressure changes Patient also denies barotrauma, vestibular suppressant use, ototoxic medication use Prior ear surgery: denies  ENT Surgery: no Personal or FHx of bleeding dz or anesthesia difficulty: no  AP/AC: Eliquis   Tobacco: 14 pack year, quit 1960s  PMHx: CVA, A-fib, HTN, DM, GERD  Independent Review of Additional Tests or Records:  Dr. Franklyn 09/06/2023 ED visit: itching and pain AD x1 week, with bleeding; some nasal congestion and sneezing; wears HA; noted TM perforation; Dx: AOM with TM rupture, Rx: abx Dr. Maree Referral notes reviewed and uploaded or available in chart in media tab (09/11/2023): noted ruptured TM, Rx: ref to  ENT Labs: 12/24/2022: CBC and CMP: BUN/Cr essentially wnl, WBC 6.8 CTH 01/29/2021 independently interpreted with respesct to ears: cuts thick so suboptimal eval but mastoids and ME well aerated; no ossicular chain or otic capsule pathology noted PMH/Meds/All/SocHx/FamHx/ROS:   Past Medical History:  Diagnosis Date   Anxiety    Ataxia, post-stroke    Back pain    Chronic midline low back pain without sciatica    CVA (cerebral vascular accident) (HCC) 03/03/2017   Hypertension    Right hip pain      Past Surgical History:  Procedure Laterality Date   BACK SURGERY  2011   CATARACT EXTRACTION, BILATERAL  1999   COLONOSCOPY WITH PROPOFOL  N/A 03/10/2017   Procedure: COLONOSCOPY WITH PROPOFOL ;  Surgeon: Aneita Gwendlyn DASEN, MD;  Location: [facility] ENDOSCOPY;  Service: Endoscopy;  Laterality: N/A;   ESOPHAGOGASTRODUODENOSCOPY N/A 03/09/2017   Procedure: ESOPHAGOGASTRODUODENOSCOPY (EGD);  Surgeon: Avram Lupita BRAVO, MD;  Location: [facility] ENDOSCOPY;  Service: Endoscopy;  Laterality: N/A;    Family History  Problem Relation Age of Onset   Stroke Mother 32   Lung disease Father        black lung   Stroke Son      Social Connections: Not on file      Current Outpatient Medications:    albuterol  (VENTOLIN  HFA) 108 (90 Base) MCG/ACT inhaler, Inhale 2 puffs into the lungs 4 (four) times daily., Disp: , Rfl:    amLODipine  (NORVASC ) 5 MG tablet, Take 5 mg by mouth daily., Disp: , Rfl:    amoxicillin  (AMOXIL ) 500 MG capsule, Take 1 capsule (500 mg total) by mouth 3 (three) times daily., Disp: 21 capsule,  Rfl: 0   apixaban  (ELIQUIS ) 5 MG TABS tablet, Take 1 tablet (5 mg total) by mouth 2 (two) times daily., Disp: 60 tablet, Rfl: 2   atorvastatin  (LIPITOR) 10 MG tablet, Take 10 mg by mouth daily., Disp: , Rfl:    famotidine  (PEPCID ) 20 MG tablet, Take 20 mg by mouth 2 (two) times daily., Disp: , Rfl:    Fluocinolone Acetonide (DERMOTIC) 0.01 % OIL, Place 4 drops in ear(s) in the morning and at bedtime for 7  days. When ears itch, use dermotic drops (4 drops twice a day) for a week, and then can stop, Disp: 20 mL, Rfl: 5   lisinopril (ZESTRIL) 10 MG tablet, TAKE 1 TABLET EVERY DAY BY MOUTH, Disp: , Rfl:    metoprolol  succinate (TOPROL  XL) 50 MG 24 hr tablet, Take 1 tablet (50 mg total) by mouth daily. Take with or immediately following a meal., Disp: 30 tablet, Rfl: 11   Omega-3 1000 MG CAPS, Take 2 g by mouth., Disp: , Rfl:    potassium chloride  (K-DUR) 10 MEQ tablet, Take 1 tablet (10 mEq total) by mouth daily., Disp: 30 tablet, Rfl: 0   Physical Exam:   BP (!) 165/83 (BP Location: Right Arm, Patient Position: Sitting, Cuff Size: Large)   Pulse (!) 105   Ht 5' 6 (1.676 m)   SpO2 95%   BMI 29.86 kg/m   Salient findings:  CN II-XII intact Given history and complaints, ear microscopy was indicated and performed for evaluation with findings as below in physical exam section and in procedures; right cerumen impaction; after clearance, Bilateral EAC clear and TM intact with well pneumatized middle ear spaces; no perforation noted; mild eczematoid change b/l Weber 512: mid Rinne 512: AC > BC b/l  Anterior rhinoscopy: Septum intact; bilateral inferior turbinates without significant hypertrophy No lesions of oral cavity/oropharynx No obviously palpable neck masses/lymphadenopathy/thyromegaly No respiratory distress or stridor  Seprately Identifiable Procedures:  Prior to initiating any procedures, risks/benefits/alternatives were explained to the patient and verbal consent obtained. Procedure: Bilateral ear microscopy and right cerumen removal using microscope (CPT [phone]) - Mod 25 Pre-procedure diagnosis: Cerumen impaction right external ears Post-procedure diagnosis: same Indication: Right cerumen impaction; given patient's otologic complaints and history as well as for improved and comprehensive examination of external ear and tympanic membrane, bilateral otologic examination using microscope was  performed and impacted cerumen removed  Procedure: Patient was placed semi-recumbent. Both ear canals were examined using the microscope with findings above. Impacted Cerumen removed on right using suction and currette with improvement in EAC examination and patency. Patient tolerated the procedure well.      Impression & Plans:  Ashley Chan is a 89 y.o. female with:  1. Chronic eczematous otitis externa of both ears   2. Impacted cerumen of right ear   3. Excoriation of left ear canal, sequela   4. Sensorineural hearing loss (SNHL) of both ears    Most likely had an exacerbation of left eczematoid OE which led to scratching and excoriation with bleeding. Resolved, denies other otologic hx. Does have intermittent exacerbations for which we will do dermotic drops BID x7d PRN - Avoid q-tips  In addition, does have SNHL for which she wears HA. We discussed baseline audio but she would like to f/u with where she bought her HA for ongoing care  - f/u PRN  See below regarding exact medications prescribed this encounter including dosages and route: Meds ordered this encounter  Medications   Fluocinolone Acetonide (DERMOTIC) 0.01 %  OIL    Sig: Place 4 drops in ear(s) in the morning and at bedtime for 7 days. When ears itch, use dermotic drops (4 drops twice a day) for a week, and then can stop    Dispense:  20 mL    Refill:  5      Thank you for allowing me the opportunity to care for your patient. Please do not hesitate to contact me should you have any other questions.  Sincerely, Eldora Blanch, MD Otolaryngologist (ENT), [facility] Phone: [phone] Fax: [phone]  01/27/2024, 8:58 AM   MDM:  Level 4 - [phone] Complexity/Problems addressed: mod  Data complexity: mod - independent review of notes, labs; independent CT interpretation - Morbidity: mod  - Drug prescribed or managed: y

## 2024-01-27 NOTE — Patient Instructions (Signed)
 When ears itch, use dermotic drops (4 drops twice a day) for a week, and then can stop
